# Patient Record
Sex: Male | Born: 1955 | Race: White | Hispanic: No | Marital: Married
Health system: Midwestern US, Community
[De-identification: ages and names within clinical notes are randomized; demographics above are authoritative.]

## PROBLEM LIST (undated history)

## (undated) DIAGNOSIS — R351 Nocturia: Secondary | ICD-10-CM

## (undated) DIAGNOSIS — R609 Edema, unspecified: Secondary | ICD-10-CM

## (undated) DIAGNOSIS — K219 Gastro-esophageal reflux disease without esophagitis: Secondary | ICD-10-CM

## (undated) DIAGNOSIS — M25511 Pain in right shoulder: Secondary | ICD-10-CM

## (undated) DIAGNOSIS — M85672 Other cyst of bone, left ankle and foot: Secondary | ICD-10-CM

## (undated) DIAGNOSIS — M159 Polyosteoarthritis, unspecified: Secondary | ICD-10-CM

## (undated) DIAGNOSIS — E66811 Obesity, class 1: Secondary | ICD-10-CM

## (undated) DIAGNOSIS — N400 Enlarged prostate without lower urinary tract symptoms: Secondary | ICD-10-CM

## (undated) DIAGNOSIS — F17200 Nicotine dependence, unspecified, uncomplicated: Secondary | ICD-10-CM

## (undated) DIAGNOSIS — F339 Major depressive disorder, recurrent, unspecified: Secondary | ICD-10-CM

## (undated) DIAGNOSIS — E669 Obesity, unspecified: Secondary | ICD-10-CM

## (undated) DIAGNOSIS — Z1212 Encounter for screening for malignant neoplasm of rectum: Secondary | ICD-10-CM

## (undated) DIAGNOSIS — M5136 Other intervertebral disc degeneration, lumbar region: Secondary | ICD-10-CM

## (undated) DIAGNOSIS — Z22322 Carrier or suspected carrier of Methicillin resistant Staphylococcus aureus: Secondary | ICD-10-CM

## (undated) DIAGNOSIS — M66371 Spontaneous rupture of flexor tendons, right ankle and foot: Secondary | ICD-10-CM

## (undated) DIAGNOSIS — G479 Sleep disorder, unspecified: Secondary | ICD-10-CM

## (undated) DIAGNOSIS — M170 Bilateral primary osteoarthritis of knee: Secondary | ICD-10-CM

## (undated) DIAGNOSIS — Z01818 Encounter for other preprocedural examination: Secondary | ICD-10-CM

## (undated) DIAGNOSIS — N529 Male erectile dysfunction, unspecified: Secondary | ICD-10-CM

## (undated) DIAGNOSIS — Z1211 Encounter for screening for malignant neoplasm of colon: Secondary | ICD-10-CM

## (undated) DIAGNOSIS — R52 Pain, unspecified: Secondary | ICD-10-CM

## (undated) DIAGNOSIS — T887XXA Unspecified adverse effect of drug or medicament, initial encounter: Secondary | ICD-10-CM

## (undated) DIAGNOSIS — Z981 Arthrodesis status: Secondary | ICD-10-CM

## (undated) DIAGNOSIS — M1712 Unilateral primary osteoarthritis, left knee: Secondary | ICD-10-CM

## (undated) DIAGNOSIS — M51369 Other intervertebral disc degeneration, lumbar region without mention of lumbar back pain or lower extremity pain: Secondary | ICD-10-CM

## (undated) DIAGNOSIS — M2550 Pain in unspecified joint: Secondary | ICD-10-CM

## (undated) DIAGNOSIS — M8560 Other cyst of bone, unspecified site: Secondary | ICD-10-CM

## (undated) DIAGNOSIS — M542 Cervicalgia: Secondary | ICD-10-CM

## (undated) DIAGNOSIS — C4401 Basal cell carcinoma of skin of lip: Secondary | ICD-10-CM

## (undated) DIAGNOSIS — G4733 Obstructive sleep apnea (adult) (pediatric): Secondary | ICD-10-CM

## (undated) DIAGNOSIS — M1990 Unspecified osteoarthritis, unspecified site: Secondary | ICD-10-CM

## (undated) DIAGNOSIS — K130 Diseases of lips: Secondary | ICD-10-CM

## (undated) DIAGNOSIS — M25512 Pain in left shoulder: Secondary | ICD-10-CM

## (undated) DIAGNOSIS — R600 Localized edema: Secondary | ICD-10-CM

## (undated) DIAGNOSIS — K4090 Unilateral inguinal hernia, without obstruction or gangrene, not specified as recurrent: Secondary | ICD-10-CM

## (undated) DIAGNOSIS — L02429 Furuncle of limb, unspecified: Secondary | ICD-10-CM

---

## 2010-02-26 LAB — CBC
Basophils: 0.3 % (ref 0.0–3.0)
Eosinophils: 5.8 % — ABNORMAL HIGH (ref 0.0–4.0)
Hematocrit: 41.9 % — ABNORMAL LOW (ref 42.0–52.0)
Hemoglobin: 14.6 gm/dl (ref 14.0–18.0)
Lymphocytes: 27.6 % (ref 15.0–47.0)
MCH: 33.2 pg — ABNORMAL HIGH (ref 27.0–31.0)
MCHC: 34.8 gm/dl (ref 33.0–37.0)
MCV: 95.4 fL — ABNORMAL HIGH (ref 80.0–94.0)
MPV: 8.4 mcm (ref 7.4–10.4)
Monocytes: 8.6 % (ref 0.0–12.0)
Nucleated Red Blood Cells: 0 /100 wbc
Platelets: 154 10*3/uL (ref 130–400)
RBC Morphology: NORMAL
RBC: 4.39 10*6/uL — ABNORMAL LOW (ref 4.70–6.10)
RDW: 13.8 % (ref 11.5–14.5)
Seg Neutrophils: 57.7 % (ref 43.0–75.0)
WBC: 6.2 10*3/uL (ref 4.8–10.8)

## 2010-02-26 LAB — LIPID PANEL
Cholesterol, Total: 173 mg/dl (ref 100–199)
HDL: 62 mg/dl
LDL Calculated: 95 mg/dl
Triglycerides: 79 mg/dl (ref 0–199)

## 2010-02-26 LAB — BASIC METABOLIC PANEL
BUN: 24 mg/dl — ABNORMAL HIGH (ref 7–22)
CO2: 22 meq/l — ABNORMAL LOW (ref 23–33)
Calcium: 9.2 mg/dl (ref 8.5–10.5)
Chloride: 110 meq/l (ref 98–111)
Creatinine: 1.1 mg/dl (ref 0.4–1.2)
Glucose: 94 mg/dl (ref 70–108)
Potassium: 4.1 meq/l (ref 3.5–5.2)
Sodium: 141 meq/l (ref 135–145)

## 2010-02-26 LAB — T4, FREE: T4 Free: 1.03 ng/dl (ref 0.89–1.76)

## 2010-02-26 LAB — GLOMERULAR FILTRATION RATE, ESTIMATED: Est, Glom Filt Rate: 69 mL/min/{1.73_m2} — AB

## 2010-02-26 LAB — TSH: TSH: 2.215 mcIU/ml (ref 0.400–4.200)

## 2010-02-27 LAB — TESTOSTERONE

## 2010-02-27 NOTE — Telephone Encounter (Signed)
Message copied by Raynald Blend on Tue Feb 27, 2010  4:43 PM  ------       Message from: August Luz       Created: Tue Feb 27, 2010  4:32 PM         Testosterone level is normal.  Notify pt.

## 2010-02-27 NOTE — Telephone Encounter (Signed)
NL test results were left with pt.

## 2010-02-28 NOTE — Telephone Encounter (Signed)
NL test results were left with pt.

## 2010-04-16 ENCOUNTER — Telehealth

## 2010-04-16 NOTE — Telephone Encounter (Signed)
T/C message from Onalee HuaDavid [phone] asking for Chantix RX to [facility], [address].  He will check w pharmacy he states.

## 2010-04-16 NOTE — Telephone Encounter (Signed)
ep'ed starter kit to pharm requested.  Will need continuing kits if needs refills.

## 2010-05-21 ENCOUNTER — Encounter

## 2010-05-21 NOTE — Telephone Encounter (Signed)
Ep;ed proscar to pharm requested

## 2010-05-21 NOTE — Telephone Encounter (Signed)
Patient would like Rx called in for proscar 5 mg.  Call patient back only if problem

## 2010-07-10 ENCOUNTER — Encounter

## 2010-07-10 MED ORDER — TADALAFIL 20 MG PO TABS
20 | ORAL_TABLET | ORAL | Status: DC | PRN
Start: 2010-07-10 — End: 2010-07-11

## 2010-07-10 NOTE — Progress Notes (Signed)
Subjective:      Patient ID: Bradley Oconnell is a 54 y.o. male.    HPI  Pt here for med refills of androgel, cialis and chantix.  Also has warts to remove.  These are anal warts and requests they be burnt off.  Review of Systems   Constitutional: Negative for fever, chills, fatigue and unexpected weight change.   HENT: Negative for ear pain, congestion, sore throat, rhinorrhea and neck pain.    Eyes: Negative for pain and visual disturbance.   Respiratory: Negative for cough, chest tightness, shortness of breath and wheezing.    Cardiovascular: Negative for chest pain and palpitations.   Gastrointestinal: Negative for nausea, vomiting, abdominal pain, diarrhea, constipation and blood in stool.   Genitourinary: Negative for urgency, frequency, hematuria and difficulty urinating.   Musculoskeletal: Negative for myalgias, back pain and joint swelling.   Skin: Negative for rash.   Neurological: Negative for dizziness and headaches.   Hematological: Negative for adenopathy. Does not bruise/bleed easily.   Psychiatric/Behavioral: Negative for behavioral problems and sleep disturbance. The patient is not nervous/anxious.        Objective:   Physical Exam   Nursing note and vitals reviewed.  Constitutional: He is oriented to person, place, and time. He appears well-developed and well-nourished.   HENT:   Head: Normocephalic and atraumatic.   Right Ear: External ear normal.   Left Ear: External ear normal.   Nose: Nose normal.   Mouth/Throat: Oropharynx is clear and moist.   Eyes: EOM are normal. Pupils are equal, round, and reactive to light.   Neck: Neck supple. Carotid bruit is not present. No thyromegaly present.   Cardiovascular: Normal rate, regular rhythm and normal heart sounds.    Pulmonary/Chest: Breath sounds normal. He has no wheezes. He has no rales.   Abdominal: Soft. Bowel sounds are normal. No tenderness. He has no rebound and no guarding.   Genitourinary:         Musculoskeletal: Normal range of motion. He  exhibits no edema.   Lymphadenopathy:     He has no cervical adenopathy.   Neurological: He is alert and oriented to person, place, and time. He has normal reflexes. No cranial nerve deficit.   Skin: Skin is warm and dry. No rash noted.   Psychiatric: He has a normal mood and affect.       Assessment:      Anal warts  Testosterone deficiency  ED  Nicotine dependence      Plan:      Refill meds per chart.  Antibiotic ointment on the wart areas til healed.

## 2010-07-11 ENCOUNTER — Encounter

## 2010-07-11 NOTE — Telephone Encounter (Signed)
Pt said that his rx's were not correct from yesterday's office visit.  The androgel needed to be 25mg  instead of 50mg  and the cialis needs to be 5mg  qd instead of 20mg .  Orders pending.

## 2010-07-11 NOTE — Telephone Encounter (Signed)
Phone both rxs.  Thanks.

## 2010-07-12 NOTE — Telephone Encounter (Signed)
Rx's called to [facility] "Rosanne Ashing".

## 2010-09-17 LAB — T4, FREE

## 2010-09-17 LAB — BASIC METABOLIC PANEL

## 2010-09-17 LAB — TSH

## 2010-09-17 LAB — SEDIMENTATION RATE, AUTOMATED

## 2010-09-17 NOTE — Progress Notes (Signed)
Subjective:      Patient ID: Bradley Oconnell is a 55 y.o. male.    HPI  Pt here with multiple complaints.  Family history of parkinsons.  Hands have been shaking in the AMs.  Feels it internally.  Only seems to be in the AMs.  Has tried to decrease caffeine.  Has also tried to go off the topamax to see if that helps.  Over the last 5 weeks, lethargy, chills and not feeling well.  Has been cold more than usual.  Dry skin.  Mobic is not controlling the arthritis.  Mostly in the ankles.  Takes seroquel 1/4 tab at night to aid with sleeping. (12.5 mg).    Review of Systems   Constitutional: Positive for fatigue. Negative for fever, chills and unexpected weight change.   HENT: Negative for ear pain, congestion, sore throat, rhinorrhea and neck pain.    Eyes: Negative for pain and visual disturbance.   Respiratory: Negative for cough, chest tightness, shortness of breath and wheezing.    Cardiovascular: Negative for chest pain and palpitations.   Gastrointestinal: Negative for nausea, vomiting, abdominal pain, diarrhea, constipation and blood in stool.   Genitourinary: Negative for urgency, frequency, hematuria and difficulty urinating.   Musculoskeletal: Negative for myalgias, back pain and joint swelling.   Skin: Negative for rash.   Neurological: Positive for tremors. Negative for dizziness and headaches.   Hematological: Negative for adenopathy. Does not bruise/bleed easily.   Psychiatric/Behavioral: Positive for sleep disturbance. Negative for behavioral problems. The patient is not nervous/anxious.        Objective:   Physical Exam   Nursing note and vitals reviewed.  Constitutional: He is oriented to person, place, and time. He appears well-developed and well-nourished.   HENT:   Head: Normocephalic and atraumatic.   Right Ear: External ear normal.   Left Ear: External ear normal.   Nose: Nose normal.   Mouth/Throat: Oropharynx is clear and moist.   Eyes: EOM are normal. Pupils are equal, round, and reactive to  light.   Neck: Neck supple. No thyromegaly present.   Cardiovascular: Normal rate, regular rhythm and normal heart sounds.    Pulmonary/Chest: Breath sounds normal. He has no wheezes. He has no rales.   Abdominal: Soft. Bowel sounds are normal. No tenderness. He has no rebound and no guarding.   Genitourinary:         Musculoskeletal: Normal range of motion. He exhibits no edema.   Lymphadenopathy:     He has no cervical adenopathy.   Neurological: He is alert and oriented to person, place, and time. He has normal reflexes. He displays tremor (very mild). No cranial nerve deficit.   Skin: Skin is warm and dry. No rash noted.   Psychiatric: He has a normal mood and affect.       Assessment:      Lethargy  Tremor  OA  Hand tremor  Genital warts      Plan:      Blood work, cbc, bmp, tsh, free t4 , hiv, esr, testosterone level  Refilled toradol as needed for oa breakthrough pain  Samples of cialis 5 mg daily.

## 2010-09-18 LAB — CBC

## 2010-09-18 NOTE — Telephone Encounter (Signed)
Spoke with patient, all labs within normal limits. Cb/lpn

## 2010-11-06 NOTE — Progress Notes (Signed)
Subjective:      Patient ID: Bradley Oconnell is a 55 y.o. male.    HPI  Pt here due to weight loss issues.  Interested in adipex.  Already on topamax.  Stopped the parnate so can do it.  BMI qualifies.  Review of Systems   Constitutional: Negative for fever, chills, fatigue and unexpected weight change.   HENT: Negative for ear pain, congestion, sore throat, rhinorrhea and neck pain.    Eyes: Negative for pain and visual disturbance.   Respiratory: Negative for cough, chest tightness, shortness of breath and wheezing.    Cardiovascular: Negative for chest pain and palpitations.   Gastrointestinal: Negative for nausea, vomiting, abdominal pain, diarrhea, constipation and blood in stool.   Genitourinary: Negative for urgency, frequency, hematuria and difficulty urinating.   Musculoskeletal: Negative for myalgias, back pain and joint swelling.   Skin: Negative for rash.   Neurological: Negative for dizziness and headaches.   Hematological: Negative for adenopathy. Does not bruise/bleed easily.   Psychiatric/Behavioral: Negative for behavioral problems and sleep disturbance. The patient is not nervous/anxious.        Objective:   Physical Exam   [nursing notereviewed.  Constitutional: He is oriented to person, place, and time. He appears well-developed and well-nourished.   HENT:   Head: Normocephalic and atraumatic.   Right Ear: External ear normal.   Left Ear: External ear normal.   Nose: Nose normal.   Mouth/Throat: Oropharynx is clear and moist.   Eyes: EOM are normal. Pupils are equal, round, and reactive to light.   Neck: Neck supple. No thyromegaly present.   Cardiovascular: Normal rate, regular rhythm and normal heart sounds.    Pulmonary/Chest: Breath sounds normal. He has no wheezes. He has no rales.   Abdominal: Soft. Bowel sounds are normal. No tenderness. He has no rebound and no guarding.   Musculoskeletal: Normal range of motion. He exhibits no edema.   Lymphadenopathy:     He has no cervical adenopathy.    Neurological: He is alert and oriented to person, place, and time. He has normal reflexes. No cranial nerve deficit.   Skin: Skin is warm and dry. No rash noted.   Psychiatric: He has a normal mood and affect.       Assessment:      Obesity  Testosterone deficiency      Plan:    #1 Adipex p37.5 mg daily

## 2010-12-03 NOTE — Progress Notes (Signed)
Subjective:      Patient ID: Bradley Oconnell is a 55 y.o. male.    HPI  Pt here for follow up of adipex.  Lost 6 ponds this month.  Feels the depression coming back off the parnate.  So wants to try for 1 more month then resume the parnate.  Is exercising.  Review of Systems   Constitutional: Negative for fever, chills, fatigue and unexpected weight change.   HENT: Negative for ear pain, congestion, sore throat, rhinorrhea and neck pain.    Eyes: Negative for pain and visual disturbance.   Respiratory: Negative for cough, chest tightness, shortness of breath and wheezing.    Cardiovascular: Negative for chest pain and palpitations.   Gastrointestinal: Negative for nausea, vomiting, abdominal pain, diarrhea, constipation and blood in stool.   Genitourinary: Negative for urgency, frequency, hematuria and difficulty urinating.   Musculoskeletal: Negative for myalgias, back pain and joint swelling.   Skin: Negative for rash.   Neurological: Negative for dizziness and headaches.   Hematological: Negative for adenopathy. Does not bruise/bleed easily.   Psychiatric/Behavioral: Negative for behavioral problems and sleep disturbance. The patient is not nervous/anxious.        Objective:   Physical Exam   Nursing note and vitals reviewed.  Constitutional: He is oriented to person, place, and time. He appears well-developed and well-nourished.   HENT:   Head: Normocephalic and atraumatic.   Right Ear: External ear normal.   Left Ear: External ear normal.   Nose: Nose normal.   Mouth/Throat: Oropharynx is clear and moist.   Eyes: EOM are normal. Pupils are equal, round, and reactive to light.   Neck: Neck supple. No thyromegaly present.   Cardiovascular: Normal rate, regular rhythm and normal heart sounds.    Pulmonary/Chest: Breath sounds normal. He has no wheezes. He has no rales.   Abdominal: Soft. Bowel sounds are normal. There is no tenderness. There is no rebound and no guarding.   Musculoskeletal: Normal range of motion.  He exhibits no edema.   Lymphadenopathy:     He has no cervical adenopathy.   Neurological: He is alert and oriented to person, place, and time. He has normal reflexes. No cranial nerve deficit.   Skin: Skin is warm and dry. No rash noted.   Psychiatric: He has a normal mood and affect.       Assessment:      Obesity  Depression      Plan:      #2 Adipex P 37. 5 mg daily

## 2010-12-06 NOTE — Telephone Encounter (Signed)
Spoke to pt and notified him not to use the Toradol or Mobic with the stomach pain as these could irritate the stomach more. Pt notified to use Bentyl and that it was EP'd to [facility]. Pt will try this medication and call if it doesn't help.

## 2010-12-06 NOTE — Telephone Encounter (Signed)
Would not use toradol or mobic with stomach pain as these could irritate the stomach more.  Recommend bentyl to help the stomach.  ep'ed to pharmacy.

## 2010-12-06 NOTE — Progress Notes (Signed)
Subjective:      Patient ID: Bradley Oconnell is a 55 y.o. male.    HPI  Pt here due to 48 hours of vomiting and diarrhea.  Doesn't feel feverish.  No bad foods.  Had similar but less severe episode in October.  Centered in the epigastrium.  Antacids did not help.  Review of Systems   Constitutional: Positive for chills. Negative for fever, fatigue and unexpected weight change.   HENT: Negative for ear pain, congestion, sore throat, rhinorrhea and neck pain.    Eyes: Negative for pain and visual disturbance.   Respiratory: Negative for cough, chest tightness, shortness of breath and wheezing.    Cardiovascular: Negative for chest pain and palpitations.   Gastrointestinal: Positive for nausea, vomiting, abdominal pain and diarrhea. Negative for constipation and blood in stool.   Genitourinary: Negative for urgency, frequency, hematuria and difficulty urinating.   Musculoskeletal: Negative for myalgias, back pain and joint swelling.   Skin: Negative for rash.   Neurological: Negative for dizziness and headaches.   Hematological: Negative for adenopathy. Does not bruise/bleed easily.   Psychiatric/Behavioral: Negative for behavioral problems and sleep disturbance. The patient is not nervous/anxious.        Objective:   Physical Exam   Nursing note and vitals reviewed.  Constitutional: He is oriented to person, place, and time. He appears well-developed and well-nourished.   HENT:   Head: Normocephalic and atraumatic.   Right Ear: External ear normal.   Left Ear: External ear normal.   Nose: Nose normal.   Mouth/Throat: Oropharynx is clear and moist.   Eyes: EOM are normal. Pupils are equal, round, and reactive to light.   Neck: Neck supple. No thyromegaly present.   Cardiovascular: Normal rate, regular rhythm and normal heart sounds.    Pulmonary/Chest: Breath sounds normal. He has no wheezes. He has no rales.   Abdominal: Soft. Bowel sounds are decreased. There is tenderness in the epigastric area. There is no rebound  and no guarding.       Musculoskeletal: Normal range of motion. He exhibits no edema.   Lymphadenopathy:     He has no cervical adenopathy.   Neurological: He is alert and oriented to person, place, and time. He has normal reflexes. No cranial nerve deficit.   Skin: Skin is warm and dry. No rash noted.   Psychiatric: He has a normal mood and affect.       Assessment:      Abdominal pain  Vomiting.      Plan:      Phenergan 25 mg every 6 hours as needed.  Work slip  Consider ugi, gb ultrasound if not improving

## 2010-12-06 NOTE — Telephone Encounter (Signed)
Pt Avery DennisonDavid Kluger req pain med.  He took Phenergan after got home from this am's appt and only vomited 1 more X.  He passed a liquid stool.  The abd pain is not worse, but it's not better so he req pain meds.  He has on hand  1.) Toradol from prev Rx (#10 left) but hasn't taken any.  2.) Mobic but has been off it X last 1 wk d/t procedure.  Pt uses [facility] [address]    Pls call pt back to respond at [phone].

## 2011-04-01 MED ORDER — FINASTERIDE 5 MG PO TABS
5 MG | ORAL_TABLET | Freq: Every day | ORAL | Status: DC
Start: 2011-04-01 — End: 2012-05-11

## 2011-04-01 MED ORDER — ZOSTER VACCINE LIVE 19400 UNT/0.65ML SC SOLR
19400 UNT/0.65ML | Freq: Once | SUBCUTANEOUS | Status: AC
Start: 2011-04-01 — End: 2011-04-01

## 2011-04-01 NOTE — Progress Notes (Addendum)
Subjective:      Patient ID: Bradley Oconnell is a 55 y.o. male.    HPI  Pt here for annual exam and med refills.  Reviewed BMI of 31.  Encouraged diet, exercise and weight loss.  Gets peripheral edema, does have arthritis in the legs.  Denies any SOB or chest pain.  Works out without getting any sxs.  Pt better with the ditropan for nocturia.  Wondering about raising the dose somewhat.  Divorced, former smoker, pmh reviewed.    Review of Systems   Constitutional: Negative for fever, chills, fatigue and unexpected weight change.   HENT: Negative for ear pain, congestion, sore throat, rhinorrhea and neck pain.    Eyes: Negative for pain and visual disturbance.   Respiratory: Negative for cough, chest tightness, shortness of breath and wheezing.    Cardiovascular: Negative for chest pain and palpitations.   Gastrointestinal: Negative for nausea, vomiting, abdominal pain, diarrhea, constipation and blood in stool.   Genitourinary: Negative for urgency, frequency, hematuria and difficulty urinating.   Musculoskeletal: Negative for myalgias, back pain and joint swelling.   Skin: Negative for rash.   Neurological: Negative for dizziness and headaches.   Hematological: Negative for adenopathy. Does not bruise/bleed easily.   Psychiatric/Behavioral: Negative for behavioral problems and sleep disturbance. The patient is not nervous/anxious.        Objective:   Physical Exam   Nursing note and vitals reviewed.  Constitutional: He is oriented to person, place, and time. He appears well-developed and well-nourished.   HENT:   Head: Normocephalic and atraumatic.   Right Ear: External ear normal.   Left Ear: External ear normal.   Nose: Nose normal.   Mouth/Throat: Oropharynx is clear and moist.   Eyes: EOM are normal. Pupils are equal, round, and reactive to light.   Neck: Neck supple. Carotid bruit is not present. No thyromegaly present.   Cardiovascular: Normal rate, regular rhythm and normal heart sounds.    Pulmonary/Chest:  Breath sounds normal. He has no wheezes. He has no rales.   Abdominal: Soft. Bowel sounds are normal. There is no tenderness. There is no rebound and no guarding.   Genitourinary:         Musculoskeletal: Normal range of motion. He exhibits no edema.   Lymphadenopathy:     He has no cervical adenopathy.   Neurological: He is alert and oriented to person, place, and time. He has normal reflexes. No cranial nerve deficit.   Skin: Skin is warm and dry. No rash noted.   Psychiatric: He has a normal mood and affect.       Assessment:      Testosterone deficiency  ED  OA  Genital wart.      Plan:      Refill meds.   Antibiotic ointment til healed  Cont exercise/ weight loss.

## 2012-05-05 ENCOUNTER — Encounter

## 2012-05-05 MED ORDER — OXYBUTYNIN CHLORIDE 5 MG PO TABS
5 MG | ORAL_TABLET | Freq: Two times a day (BID) | ORAL | Status: DC
Start: 2012-05-05 — End: 2012-05-11

## 2012-05-05 NOTE — Telephone Encounter (Signed)
Patient's partner called.  States that he has an appt on Monday for med refills but only has 3 oxybutinin left.  Can we call in a short term supply for him?  Call him back only if any problems

## 2012-05-05 NOTE — Telephone Encounter (Signed)
ep'ed oxybutinin to pharm requested

## 2012-05-11 MED ORDER — MELOXICAM 15 MG PO TABS
15 MG | ORAL_TABLET | Freq: Every day | ORAL | Status: DC
Start: 2012-05-11 — End: 2012-10-27

## 2012-05-11 MED ORDER — FINASTERIDE 5 MG PO TABS
5 MG | ORAL_TABLET | Freq: Every day | ORAL | Status: DC
Start: 2012-05-11 — End: 2012-10-27

## 2012-05-11 MED ORDER — TRANYLCYPROMINE SULFATE 10 MG PO TABS
10 MG | ORAL_TABLET | Freq: Three times a day (TID) | ORAL | Status: DC
Start: 2012-05-11 — End: 2012-10-27

## 2012-05-11 MED ORDER — OXYBUTYNIN CHLORIDE 5 MG PO TABS
5 MG | ORAL_TABLET | Freq: Two times a day (BID) | ORAL | Status: DC
Start: 2012-05-11 — End: 2014-07-12

## 2012-05-11 MED ORDER — KETOROLAC TROMETHAMINE 10 MG PO TABS
10 MG | ORAL_TABLET | Freq: Four times a day (QID) | ORAL | Status: DC | PRN
Start: 2012-05-11 — End: 2013-03-03

## 2012-05-11 MED ORDER — TOPIRAMATE 100 MG PO TABS
100 MG | ORAL_TABLET | Freq: Three times a day (TID) | ORAL | Status: DC
Start: 2012-05-11 — End: 2012-10-27

## 2012-05-11 NOTE — Progress Notes (Signed)
Have you seen any other physician or provider since your last visit no    Have you had any other diagnostic tests since your last visit? no    Have you changed or stopped any medications since your last visit including any over-the-counter medicines, vitamins, or herbal medicines? no     Are you taking all your prescribed medications? Yes    If NO, why?

## 2012-05-11 NOTE — Progress Notes (Signed)
Subjective:      Patient ID: Bradley Oconnell is a 56 y.o. male.    HPI  Pt here for annual exam and med refills.  Needs fasting blood work.  Reviewed BMI of 31.  Encouraged diet, exercise and weight loss..  Reviewed health maintenance.  Peripheral edema has been happening but no SOB, chest pain.  Divorced, non smoker, pmh reviewed.  Review of Systems   Constitutional: Negative for fever, chills, fatigue and unexpected weight change.   HENT: Negative for ear pain, congestion, sore throat, rhinorrhea and neck pain.    Eyes: Negative for pain and visual disturbance.   Respiratory: Negative for cough, chest tightness, shortness of breath and wheezing.    Cardiovascular: Positive for leg swelling. Negative for chest pain and palpitations.   Gastrointestinal: Negative for nausea, vomiting, abdominal pain, diarrhea, constipation and blood in stool.   Genitourinary: Negative for urgency, frequency, hematuria and difficulty urinating.   Musculoskeletal: Negative for myalgias, back pain and joint swelling.   Skin: Negative for rash.   Neurological: Negative for dizziness and headaches.   Hematological: Negative for adenopathy. Does not bruise/bleed easily.   Psychiatric/Behavioral: Negative for behavioral problems and sleep disturbance. The patient is not nervous/anxious.        Objective:   Physical Exam   Nursing note and vitals reviewed.  Constitutional: He is oriented to person, place, and time. He appears well-developed and well-nourished.   HENT:   Head: Normocephalic and atraumatic.   Right Ear: External ear normal.   Left Ear: External ear normal.   Nose: Nose normal.   Mouth/Throat: Oropharynx is clear and moist.   Eyes: EOM are normal. Pupils are equal, round, and reactive to light.   Neck: Neck supple. Carotid bruit is not present. No thyromegaly present.   Cardiovascular: Normal rate, regular rhythm and normal heart sounds.    Pulmonary/Chest: Breath sounds normal. He has no wheezes. He has no rales.   Abdominal:  Soft. Bowel sounds are normal. There is no tenderness. There is no rebound and no guarding.   Musculoskeletal: Normal range of motion. He exhibits no edema.        Right lower leg: He exhibits edema.        Left lower leg: He exhibits edema.   1-2 + edema     Lymphadenopathy:     He has no cervical adenopathy.   Neurological: He is alert and oriented to person, place, and time. He has normal reflexes. No cranial nerve deficit.   Skin: Skin is warm and dry. No rash noted.   Psychiatric: He has a normal mood and affect.     Health Maintenance   Topic Date Due   ??? Colon Cancer Screening Colonoscopy  07/05/2004   ??? Flu Vaccine Yearly (Adult)  03/22/2013   ??? Psa Counseling  05/11/2013   ??? Tetanus Vaccine Adult (11 Years And Up)  05/11/2022        Assessment:      Overactive bladder  OA    Chronic depression  Obesity      Plan:      Refill meds  Fasting blood work orders  Encouraged diet, exercise and weight loss.  Reviewed health maintenance    Onalee Hua received counseling on the following healthy behaviors: nutrition, exercise and medication adherence    Patient given educational materials on Nutrition and Exercise    I have instructed Axyl to complete a self tracking handout on Weights and instructed them to bring it  with them to his next appointment.     Discussed use, benefit, and side effects of prescribed medications.  Barriers to medication compliance addressed.  All patient questions answered.  Pt voiced understanding.

## 2012-05-22 LAB — GLOMERULAR FILTRATION RATE, ESTIMATED: Est, Glom Filt Rate: 62 mL/min/{1.73_m2} — AB

## 2012-05-22 LAB — BASIC METABOLIC PANEL
BUN: 25 mg/dl — ABNORMAL HIGH (ref 7–22)
CO2: 25 meq/l (ref 23–33)
Calcium: 8.7 mg/dl (ref 8.5–10.5)
Chloride: 108 meq/l (ref 98–111)
Creatinine: 1.2 mg/dl (ref 0.4–1.2)
Glucose: 101 mg/dl (ref 70–108)
Potassium: 4.2 meq/l (ref 3.5–5.2)
Sodium: 137 meq/l (ref 135–145)

## 2012-05-22 LAB — LIPID PANEL
Cholesterol, Total: 174 mg/dl (ref 100–199)
HDL: 75 mg/dl
LDL Calculated: 84 mg/dl
Triglycerides: 76 mg/dl (ref 0–199)

## 2012-05-22 LAB — HEPATIC FUNCTION PANEL
ALT: 26 U/L (ref 11–66)
AST: 32 U/L (ref 12–45)
Albumin: 4.5 gm/dl (ref 3.5–5.1)
Alkaline Phosphatase: 60 U/L (ref 38–126)
Bilirubin, Direct: 0.3 mg/dl (ref 0.0–0.3)
Total Bilirubin: 0.8 mg/dl (ref 0.3–1.2)
Total Protein: 7.3 gm/dl (ref 6.1–8.0)

## 2012-05-22 LAB — TSH: TSH: 2.684 mcIU/ml (ref 0.400–4.200)

## 2012-05-22 LAB — CBC
Hematocrit: 41.9 % — ABNORMAL LOW (ref 42.0–52.0)
Hemoglobin: 14.2 gm/dl (ref 14.0–18.0)
MCH: 31.7 pg — ABNORMAL HIGH (ref 27.0–31.0)
MCHC: 34 gm/dl (ref 33.0–37.0)
MCV: 93.5 fL (ref 80.0–94.0)
MPV: 8.1 mcm (ref 7.4–10.4)
Platelets: 184 10*3/uL (ref 130–400)
RBC: 4.48 10*6/uL — ABNORMAL LOW (ref 4.70–6.10)
RDW: 12.9 % (ref 11.5–14.5)
WBC: 5.8 10*3/uL (ref 4.8–10.8)

## 2012-05-22 LAB — PSA PROSTATIC SPECIFIC ANTIGEN: PSA: 0.31 ng/ml (ref 0.00–2.50)

## 2012-05-22 LAB — T4, FREE: T4 Free: 1.19 ng/dl (ref 0.89–1.76)

## 2012-05-22 NOTE — Telephone Encounter (Signed)
Left message for pt to call back.

## 2012-05-22 NOTE — Telephone Encounter (Signed)
Message copied by Nani RavensEMAN, DIANE on Fri May 22, 2012  8:32 AM  ------       Message from: August LuzINGWALD, RONALD A       Created: Fri May 22, 2012  8:14 AM         Notify bmp, cbc, thyroid, liver, and psa are normal.  Cholesterol good at 174 with low CRR.  Continue present.  ------

## 2012-05-26 NOTE — Telephone Encounter (Signed)
Pt notified that bmp, cbc, thyroid, liver, and psa are normal.  Cholesterol at 174.  Continue present.

## 2012-10-27 MED ORDER — FINASTERIDE 5 MG PO TABS
5 MG | ORAL_TABLET | Freq: Every day | ORAL | Status: DC
Start: 2012-10-27 — End: 2012-12-21

## 2012-10-27 MED ORDER — OXYBUTYNIN CHLORIDE 5 MG PO TABS
5 MG | ORAL_TABLET | Freq: Two times a day (BID) | ORAL | Status: DC
Start: 2012-10-27 — End: 2012-11-16

## 2012-10-27 MED ORDER — MELOXICAM 15 MG PO TABS
15 MG | ORAL_TABLET | Freq: Every day | ORAL | Status: DC
Start: 2012-10-27 — End: 2012-12-21

## 2012-10-27 MED ORDER — MODAFINIL 200 MG PO TABS
200 MG | ORAL_TABLET | Freq: Every day | ORAL | Status: DC
Start: 2012-10-27 — End: 2012-11-11

## 2012-10-27 MED ORDER — TOPIRAMATE 100 MG PO TABS
100 MG | ORAL_TABLET | Freq: Three times a day (TID) | ORAL | Status: DC
Start: 2012-10-27 — End: 2012-11-16

## 2012-10-27 MED ORDER — TRANYLCYPROMINE SULFATE 10 MG PO TABS
10 MG | ORAL_TABLET | Freq: Three times a day (TID) | ORAL | Status: DC
Start: 2012-10-27 — End: 2012-11-16

## 2012-10-27 MED ORDER — TESTOSTERONE 50 MG/5GM (1%) TD GEL
50 MG/5GM (1%) | PACK | Freq: Every day | TRANSDERMAL | Status: DC
Start: 2012-10-27 — End: 2013-02-15

## 2012-10-27 NOTE — Progress Notes (Signed)
Have you seen any other physician or provider since your last visit no    Have you had any other diagnostic tests since your last visit? yes -labs    Have you changed or stopped any medications since your last visit including any over-the-counter medicines, vitamins, or herbal medicines? no     Are you taking all your prescribed medications? Yes    If NO, why?

## 2012-10-27 NOTE — Progress Notes (Addendum)
Subjective:      Patient ID: Bradley Oconnell is a 57 y.o. male.    HPI  Pt here for pre-op physical at the request of DR. Haycock.  Scheduled to have right foot surgery on 11/11/2012 for hammer toes.  He has failed conservative therapy.  The hammer toes have progressed over the last several months.  These are causing pain and mal-fitting of his shoes.  Rated 5 out 10.  Tight shoes and excessive walking make the condition worse.  Reviewed BMI of 31.  Encouraged diet, exercise and weight loss.  Also having neck and right shoulder pain.  Has had MRI of neck previously showing arthritis and spurring.  Shoveling this winter, his right shoulder started to bother him.  Would like to restart androgel and provigil for wakefulness.  Single, former smoker, pmh reviewed.  Pt has a functional capacity.  No caths or stress tests in the last 5 years.  Denies any sob or chest pain.    Review of Systems   Constitutional: Negative for fever, chills, fatigue and unexpected weight change.   HENT: Positive for neck pain. Negative for ear pain, congestion, sore throat and rhinorrhea.    Eyes: Negative for pain and visual disturbance.   Respiratory: Negative for cough, chest tightness, shortness of breath and wheezing.    Cardiovascular: Negative for chest pain and palpitations.   Gastrointestinal: Negative for nausea, vomiting, abdominal pain, diarrhea, constipation and blood in stool.   Genitourinary: Negative for urgency, frequency, hematuria and difficulty urinating.   Musculoskeletal: Negative for myalgias, back pain and joint swelling.        Right foot pain  Right shoulder pain   Skin: Negative for rash.   Neurological: Negative for dizziness and headaches.   Hematological: Negative for adenopathy. Does not bruise/bleed easily.   Psychiatric/Behavioral: Negative for behavioral problems and sleep disturbance. The patient is not nervous/anxious.      Past Medical History   Diagnosis Date   ??? Depression, recurrent    ??? ED (erectile  dysfunction)    ??? OA (osteoarthritis)    ??? Testosterone deficiency    ??? Genital warts      Past Surgical History   Procedure Laterality Date   ??? Foot surgery Bilateral      x 4   ??? Hernia repair  2010   ??? Hemorrhoid surgery  2006     No Known Allergies  Current outpatient prescriptions:oxybutynin (DITROPAN) 5 MG tablet, Take 1 tablet by mouth 2 times daily., Disp: 60 tablet, Rfl: 11;  ketorolac (TORADOL) 10 MG tablet, Take 1 tablet by mouth every 6 hours as needed., Disp: 30 tablet, Rfl: 1;  tranylcypromine (PARNATE) 10 MG tablet, Take 1 tablet by mouth 3 times daily., Disp: 90 tablet, Rfl: 11;  topiramate (TOPAMAX) 100 MG tablet, Take 1 tablet by mouth 3 times daily., Disp: 90 tablet, Rfl: 11  meloxicam (MOBIC) 15 MG tablet, Take 1 tablet by mouth daily., Disp: 30 tablet, Rfl: 11;  finasteride (PROSCAR) 5 MG tablet, Take 1 tablet by mouth daily., Disp: 30 tablet, Rfl: 11;  tadalafil (CIALIS) 5 MG tablet, Take 1 tablet daily, Disp: 30 tablet, Rfl: 5  History     Social History   ??? Marital Status: Divorced     Spouse Name: N/A     Number of Children: N/A   ??? Years of Education: N/A     Occupational History   ??? Not on file.     Social History Main Topics   ???  Smoking status: Former Smoker   ??? Smokeless tobacco: Never Used   ??? Alcohol Use: Yes      Comment: socail   ??? Drug Use: Not on file   ??? Sexually Active: Not on file     Other Topics Concern   ??? Not on file     Social History Narrative   ??? No narrative on file     Family History   Problem Relation Age of Onset   ??? Parkinsonism Mother    ??? Heart Disease Father      Congestive Heart       Objective:   Physical Exam   Nursing note and vitals reviewed.  Constitutional: He is oriented to person, place, and time. He appears well-developed and well-nourished.   HENT:   Head: Normocephalic and atraumatic.   Right Ear: External ear normal.   Left Ear: External ear normal.   Nose: Nose normal.   Mouth/Throat: Oropharynx is clear and moist.   Eyes: EOM are normal. Pupils are  equal, round, and reactive to light.   Neck: Neck supple. Carotid bruit is not present. No thyromegaly present.   Cardiovascular: Normal rate, regular rhythm and normal heart sounds.    Pulmonary/Chest: Breath sounds normal. He has no wheezes. He has no rales.   Abdominal: Soft. Bowel sounds are normal. There is no tenderness. There is no rebound and no guarding.   Musculoskeletal: He exhibits no edema.        Right shoulder: He exhibits decreased range of motion.   Weakness of right rotator cuff     Lymphadenopathy:     He has no cervical adenopathy.   Neurological: He is alert and oriented to person, place, and time. He has normal reflexes. No cranial nerve deficit.   Skin: Skin is warm and dry. No rash noted.   Psychiatric: He has a normal mood and affect.     Filed Vitals:    10/27/12 1617   BP: 106/70   Pulse: 68   Temp: 98.2 ??F (36.8 ??C)   TempSrc: Oral   Resp: 16   Height: 5\' 6"  (1.676 m)   Weight: 194 lb (87.998 kg)     Assessment:      Pre-op physical  Right shoulder pain  Testosterone deficiency  Right foot hammer toes  Cervical DDD  Lethargy         Plan:      Pt is cleared for proposed surgery  Note to DR. Haycock.  MRI of cervical spine and right shoulder without contrast.  Androgel for testosterone deficiency   Trial of provigil for wakefulness  Stop toradol, mobic, and parnate prior to surgery

## 2012-10-30 NOTE — Telephone Encounter (Signed)
Spoke with Pt regarding Prior-Auth for MRI, additional information needed to complete prior-auth form.

## 2012-11-02 NOTE — Telephone Encounter (Signed)
MRI cervical spine approved 10/29/12-11/28/12 ref #[id number]    Left message for patient to call back to notify him MRI was approved.

## 2012-11-03 NOTE — Telephone Encounter (Signed)
Left VM for Pt to call the office.    Pt Prior auth for provigil was approved.

## 2012-11-03 NOTE — Telephone Encounter (Signed)
Pt called, informed MRI was approved.    Pt also states he his having trouble with his Provigil at the pharmacy. Please call [facility] on [address].    Spoke with [facility], Provigil needs a prior-auth, they will fax information.

## 2012-11-04 NOTE — Patient Instructions (Signed)
Pt. Instructed nothing to eat or drink after midnight the day before.  Bring a driver.  Take heart and blood pressure in the morning with a sip of water.  Bring photo ID and insurance cards.  Leave jewelry and valuables at home. Wear comfortable clothes. Wash the night before and morning of surgery with an antibacterial soap.

## 2012-11-05 ENCOUNTER — Telehealth

## 2012-11-05 NOTE — Telephone Encounter (Signed)
Message copied by Dorothea Glassman on Thu Nov 05, 2012  1:49 PM  ------       Message from: August Luz A       Created: Thu Nov 05, 2012 12:10 PM         Neck mri is unchanged from previous and shows varying degrees of stenosis.  ------

## 2012-11-05 NOTE — Telephone Encounter (Signed)
Pt informed of results, pt voiced understanding and had no further questions.  Pt would like referral to Dr Allena Katz.

## 2012-11-05 NOTE — Telephone Encounter (Signed)
Referral faxed to Dr Allena Katz at [place],  [phone]. Pt will call to schedule.

## 2012-11-05 NOTE — Telephone Encounter (Signed)
Message copied by Dorothea Glassman on Thu Nov 05, 2012  1:49 PM  ------       Message from: August Luz A       Created: Thu Nov 05, 2012 12:08 PM         MRI of the shoulder reveals multiple tears.  Recommend ortho.  ------

## 2012-11-06 ENCOUNTER — Telehealth

## 2012-11-06 NOTE — Telephone Encounter (Signed)
Ok for epidurals x 3

## 2012-11-06 NOTE — Telephone Encounter (Signed)
Pt called and would like to have an epidural  for his shoulder/neck pain @ [facility].  He would like to go this route instead of Ortho. He has done it this way in the past and would like to avoid surgery. If this does not help he will see Dr Allena Katz.

## 2012-11-09 NOTE — Telephone Encounter (Signed)
Spoke to patient.  Notified him ok for epidurals x 3.  Orders fax[facility] to [facility].  He will call [facility] to schedule.

## 2012-11-11 MED ORDER — FENTANYL CITRATE 0.05 MG/ML IJ SOLN
0.05 | INTRAMUSCULAR | Status: AC | PRN
Start: 2012-11-11 — End: 2012-11-11
  Administered 2012-11-11 (×4): 25 ug via INTRAVENOUS

## 2012-11-11 MED ORDER — PROMETHAZINE HCL 25 MG/ML IJ SOLN
25 | Freq: Once | INTRAMUSCULAR | Status: DC | PRN
Start: 2012-11-11 — End: 2012-11-11

## 2012-11-11 MED ORDER — DIPHENHYDRAMINE HCL 50 MG/ML IJ SOLN
50 | Freq: Once | INTRAMUSCULAR | Status: DC | PRN
Start: 2012-11-11 — End: 2012-11-11

## 2012-11-11 MED ADMIN — fentaNYL (SUBLIMAZE) injection 50 mcg: 50 ug | INTRAVENOUS | @ 14:00:00 | NDC 00409909332

## 2012-11-11 MED ADMIN — oxyCODONE-acetaminophen (PERCOCET) 5-325 MG per tablet 2 tablet: 2 | ORAL | @ 14:00:00 | NDC 00054865024

## 2012-11-11 MED ADMIN — ceFAZolin 1 gram (ANCEF) IVPB: 1 g | INTRAVENOUS | @ 12:00:00 | NDC 00338350341

## 2012-11-11 MED FILL — ONDANSETRON HCL 4 MG/2ML IJ SOLN: 4 MG/2ML | INTRAMUSCULAR | Qty: 2

## 2012-11-11 MED FILL — MIDAZOLAM HCL 2 MG/2ML IJ SOLN: 2 MG/ML | INTRAMUSCULAR | Qty: 4

## 2012-11-11 MED FILL — OXYCODONE-ACETAMINOPHEN 5-325 MG PO TABS: 5-325 MG | ORAL | Qty: 2

## 2012-11-11 MED FILL — DIPRIVAN 10 MG/ML IV EMUL: 10 MG/ML | INTRAVENOUS | Qty: 20

## 2012-11-11 MED FILL — DEXAMETHASONE SODIUM PHOSPHATE 4 MG/ML IJ SOLN: 4 MG/ML | INTRAMUSCULAR | Qty: 5

## 2012-11-11 MED FILL — FENTANYL CITRATE 0.05 MG/ML IJ SOLN: 0.05 MG/ML | INTRAMUSCULAR | Qty: 2

## 2012-11-11 MED FILL — CEFAZOLIN SODIUM 1-5 GM-% IV SOLN: 1-5 GM-% | INTRAVENOUS | Qty: 50

## 2012-11-11 MED FILL — SENSORCAINE-MPF 0.5 % IJ SOLN: 0.5 % | INTRAMUSCULAR | Qty: 30

## 2012-11-11 MED FILL — LIDOCAINE HCL (CARDIAC) 20 MG/ML IV SOLN: 20 MG/ML | INTRAVENOUS | Qty: 5

## 2012-11-11 MED FILL — SENSORCAINE-MPF/EPINEPHRINE 0.5% -1:200000 IJ SOLN: INTRAMUSCULAR | Qty: 30

## 2012-11-11 MED FILL — FENTANYL CITRATE 0.05 MG/ML IJ SOLN: 0.05 MG/ML | INTRAMUSCULAR | Qty: 4

## 2012-11-11 MED FILL — HYDROMORPHONE HCL 2 MG/ML IJ SOLN: 2 MG/ML | INTRAMUSCULAR | Qty: 1

## 2012-11-11 NOTE — H&P (Signed)
[facility]  History and Physical Update    Pt Name: Bradley Oconnell  MRN: [id number]  Birthdate: Nov 08, 1955  Date of evaluation: 11/11/2012    [x]  I have examined the patient and reviewed the H&P/Consult and there are no changes to the patient or plans.    []  I have examined the patient and reviewed the H&P/Consult and have noted the following changes:        Jock Mahon M. Nahla Lukin DPM  Electronically signed 11/11/2012 at 7:25 AM

## 2012-11-11 NOTE — Op Note (Signed)
[facility]                                    [place], [place]                                   RECORD OF OPERATION     PATIENT NAME: Bradley Oconnell, Bradley Oconnell               DOB: 1956-03-01  MEDICAL RECORD NO. [id number]                 ROOM: CE  ACCOUNT NO: [id number]                          DATE: 11/11/2012  SURGEON: Lucila Maine, D.P.M.        This is Valere Dross, D.P.M., dictating an operative report for Lucila Maine, D.P.M.     SURGEON:  Lucila Maine, D.P.M.     ASSISTANT:  Valere Dross, D.P.M. and Iona Beard, MS-3.     PREOPERATIVE DIAGNOSIS:       1.    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       2.    Elevated fifth digit, right foot.       3.    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       4.    Hammertoe contracture, fourth toe, left foot.     POSTOPERATIVE DIAGNOSIS:       1.    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       2.    Elevated fifth digit, right foot.       3.    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       5.    Hammertoe contracture, fourth toe, left foot.     PROCEDURE PERFORMED:       1.    Proximal interphalangeal joint arthrodesis, digits 3 and 4, right             foot.       2.    Weil osteotomy, metatarsals 2-4, right foot.       3.    Dorsal capsulotomy, fifth metatarsophalangeal joint, right foot.       4.    Plantar skin plasty, fifth digit, right foot.       5.    Percutaneous flexor tenotomy, fourth digit, left foot.     ANESTHESIA:  General.     ESTIMATED BLOOD LOSS:  5 ml.     HEMOSTASIS:  1 well padded pneumatic ankle tourniquet inflated to 250 mmHg.     MATERIALS:       1.   Two size large Smart Toe implants.      2.    Three 2.0x14 mm snap off screws.     INJECTABLES:  None.     PATHOLOGY:  None.     COMPLICATIONS:  None.     PROCEDURE:  The patient was transported from preop to OR and placed in a  supine position.  Pneumatic ankle tourniquet was applied to the right ankle.  After proper anesthesia was  obtained, the foot was then prepped and draped in  normal aseptic manner.     The foot was then exsanguinated and the  tourniquet was inflated to 250 mmHg.  Attention was directed to the third digit, where an incision was made over  the proximal interphalangeal joint.  Dissection was carried down to the  subcutaneous tissues, being careful to retract all vital and neurovascular  structures.  All bleeders were bovied and cauterized as necessary.  Once  dissection was carried down to the level of the extensor tendon, the extensor  tendon was then tenotomized at the proximal interphalangeal joint and  reflected proximally.  Next, utilizing a sagittal saw, the head of the  proximal phalanx and base of the middle phalanx was resected and placed on  the back table.  After this was done, a Smart Toe was opened on the back  table and inserted into the proximal interphalangeal joint, fusing it  together.  It was held together and allowed to expand.  This allowed for  excellent compression across the proximal interphalangeal joint.     Attention was directed to the fourth digit, where an incision was made over  the proximal interphalangeal joint.  Dissection was carried down to the  subcutaneous tissues, being careful to retract all vital and neurovascular  structures.  All bleeders were bovied and cauterized as necessary.  Once  dissection was carried down to the level of the extensor tendon, the extensor  tendon was then tenotomized at the proximal interphalangeal joint and  reflected proximally.  Next, utilizing a sagittal saw, the head of the  proximal phalanx and base of the middle phalanx was resected and placed on  the back table.  After this was done, a Smart Toe was opened on the back  table and inserted into the proximal interphalangeal joint, fusing it  together.  It was held together and allowed to expand.  This allowed for  excellent compression across the proximal interphalangeal joint.     Attention was directed  to the second metatarsophalangeal joint, where an  incision was made over the second MPJ.  Dissection was carried down to the  subcutaneous tissues, being careful to retract all vital and neurovascular  structures.  All bleeders were bovied and cauterized as necessary.  Once  dissection was carried down to the level of the capsule, the capsule was  incised linearly, thus exposing the head of the second metatarsal.  After  this was done, utilizing a sagittal saw, a Weil type osteotomy was made in  the metatarsal head.  After this was done, the capital fragment was then  translocated medially and a 14 mm 2.0 snap off screw was then inserted into  the second metatarsal head.  All rough edges were smoothed off with a  rongeur, allowing the metatarsophalangeal joint to have smooth range of  motion.  Next, this incision was then flushed with copious amounts of normal  sterile saline, and the deep capsular structures were closed with 3-0 Vicryl,  and the skin was reapproximated and coapted using 4-0 Prolene in a simple  interrupted and vertical mattress suture technique.     Attention was directed to the dorsal aspect of the fifth digit, where an  incision was made over the fifth proximal interphalangeal joint.  At this  time, the extensor tendon and capsule were then tenotomized and capsulotomy  was performed on the dorsal aspect of the fifth metatarsophalangeal joint.  This was then flushed with copious amounts of normal sterile saline, and then  incision was closed with 4-0 Prolene in a simple interrupted and vertical  mattress suture technique.  Attention was directed to the plantar aspect of the fifth digit, where an  elliptical skin plasty was removed from the plantar digital sulcus.  This was  then placed on the back table.  This was then reapproximated with 4-0  Monocryl in a simple interrupted and vertical mattress suture technique.  After this was done, the foot was cleansed and the tourniquet was  deflated.  An immediate hyperemic response was noted to the digits of the right foot.     Attention was then directed to left foot, where attention was directed to the  fourth digit of the left foot and utilizing a 61 blade, a percutaneous flexor  tenotomy was performed on the fourth digit of the left foot.  This allowed  the toe to float in a more corrected position, decreasing the contracture  hammertoe present on the fourth digit.  This was then flushed and splinted  down with 4x4s, Kerlix and an Ace wrap was applied.  A postop dressing of  Adaptic, 4x4s, Kerlix and Ace wrap was applied to the right foot as well.  Postop shoes were applied.  The patient tolerated both the procedure and  anesthesia well.  No complications were noted throughout the procedure.  The  patient was transported to PACU with vital signs stable and neurovascular  structures intact.  The patient will be discharged per PACU protocol.  The  patient will follow up with Dr. Erma Pinto in his office.           Valere Dross, D.P.M.        D: 11/11/2012 16:28                                   T: 11/11/2012 23:45  als     CC:  Valere Dross, D.P.M.  [address]  [place], [place] [address]     Lucila Maine, D.P.M.  [facility]  [address]  [place], [place] [address]

## 2012-11-11 NOTE — Anesthesia Pre-Procedure Evaluation (Signed)
Bradley Oconnell     Anesthesia Evaluation     Patient summary reviewed    Airway   Mallampati: II  TM distance: >3 FB  Neck ROM: full  Dental - normal exam     Pulmonary - normal exam    breath sounds clear to auscultation  Cardiovascular - negative ROS and normal exam    Rhythm: regular  Rate: normal    Neuro/Psych - negative ROS   GI/Hepatic/Renal - negative ROS     Endo/Other - negative ROS   Abdominal  - normal exam                  Allergies: Review of patient's allergies indicates no known allergies.    NPO Status: Time of last liquid consumption: 2345                       Time of last solid food consumption: 2345  [facility]  PRE-ANESTHESIA EVALUATION FORM       Name:  Bradley Oconnell                                         Age:  57 y.o.  MRN:  [id number]           No Known Allergies  Patient Active Problem List   Diagnosis   ??? ED (erectile dysfunction)   ??? Testosterone deficiency   ??? OA (osteoarthritis)   ??? Overactive bladder   ??? Chronic depression   ??? Obesity (BMI 30.0-34.9)     Past Medical History   Diagnosis Date   ??? Depression, recurrent    ??? ED (erectile dysfunction)    ??? OA (osteoarthritis)    ??? Testosterone deficiency    ??? Genital warts      Past Surgical History   Procedure Laterality Date   ??? Foot surgery Bilateral      x 4   ??? Hernia repair  2010   ??? Hemorrhoid surgery  2006     History   Substance Use Topics   ??? Smoking status: Former Smoker   ??? Smokeless tobacco: Never Used   ??? Alcohol Use: Yes      Comment: social     Medications  Current Outpatient Prescriptions on File Prior to Encounter   Medication Sig Dispense Refill   ??? finasteride (PROSCAR) 5 MG tablet Take 1 tablet by mouth daily.  30 tablet  11   ??? oxybutynin (DITROPAN) 5 MG tablet Take 1 tablet by mouth 2 times daily.  60 tablet  11   ??? tadalafil (CIALIS) 5 MG tablet Take 1 tablet daily  30 tablet  5   ??? meloxicam (MOBIC) 15 MG tablet Take 1 tablet by mouth daily.  30 tablet  11   ??? topiramate (TOPAMAX) 100 MG tablet Take  1 tablet by mouth 3 times daily.  90 tablet  11   ??? tranylcypromine (PARNATE) 10 MG tablet Take 1 tablet by mouth 3 times daily.  90 tablet  11   ??? testosterone (ANDROGEL) 50 MG/5GM GEL 1% gel Apply 5 g topically daily.  30 Package  5   ??? ketorolac (TORADOL) 10 MG tablet Take 1 tablet by mouth every 6 hours as needed.  30 tablet  1   ??? [DISCONTINUED] oxybutynin (DITROPAN) 5 MG tablet Take 1 tablet by mouth 2  times daily.  60 tablet  11     No current facility-administered medications on file prior to encounter.     Current Outpatient Prescriptions   Medication Sig Dispense Refill   ??? finasteride (PROSCAR) 5 MG tablet Take 1 tablet by mouth daily.  30 tablet  11   ??? oxybutynin (DITROPAN) 5 MG tablet Take 1 tablet by mouth 2 times daily.  60 tablet  11   ??? tadalafil (CIALIS) 5 MG tablet Take 1 tablet daily  30 tablet  5   ??? meloxicam (MOBIC) 15 MG tablet Take 1 tablet by mouth daily.  30 tablet  11   ??? topiramate (TOPAMAX) 100 MG tablet Take 1 tablet by mouth 3 times daily.  90 tablet  11   ??? tranylcypromine (PARNATE) 10 MG tablet Take 1 tablet by mouth 3 times daily.  90 tablet  11   ??? testosterone (ANDROGEL) 50 MG/5GM GEL 1% gel Apply 5 g topically daily.  30 Package  5   ??? ketorolac (TORADOL) 10 MG tablet Take 1 tablet by mouth every 6 hours as needed.  30 tablet  1   ??? [DISCONTINUED] oxybutynin (DITROPAN) 5 MG tablet Take 1 tablet by mouth 2 times daily.  60 tablet  11     Current Facility-Administered Medications   Medication Dose Route Frequency Provider Last Rate Last Dose   ??? 0.9 % sodium chloride infusion   Intravenous Continuous Dinah Beers, DPM       ??? sodium chloride flush 0.9 % injection 10 mL  10 mL Intravenous Q12H SCH Dinah Beers, DPM       ??? sodium chloride flush 0.9 % injection 10 mL  10 mL Intravenous PRN Dinah Beers, DPM       ??? ceFAZolin 1 gram (ANCEF) IVPB  1 g Intravenous Once Dinah Beers, DPM         Vitals   Filed Vitals:    11/11/12 0653   BP: 113/75   Pulse: 66   Temp:  97.7 ??F (36.5 ??C)   Resp: 16     BP Readings from Last 3 Encounters:   11/11/12 113/75   10/27/12 106/70   05/11/12 108/84     BMI  Ht Readings from Last 1 Encounters:   11/11/12 5\' 6"  (1.676 m)     Wt Readings from Last 1 Encounters:   11/11/12 190 lb (86.183 kg)     Body mass index is 30.68 kg/(m^2).  Estimated body mass index is 30.68 kg/(m^2) as calculated from the following:    Height as of this encounter: 5\' 6"  (1.676 m).    Weight as of this encounter: 190 lb (86.183 kg).    CBC   Lab Results   Component Value Date    WBC 5.8 05/22/2012    WBC 6.2 02/26/2010    RBC 4.48 05/22/2012    RBC 4.39 02/26/2010    HGB 14.2 05/22/2012    HCT 41.9 05/22/2012    MCV 93.5 05/22/2012    RDW 12.9 05/22/2012    PLT 184 05/22/2012     CMP    Lab Results   Component Value Date    NA 137 05/22/2012    K 4.2 05/22/2012    CL 108 05/22/2012    CO2 25 05/22/2012    BUN 25 05/22/2012    BUN 24 02/26/2010    CREATININE 1.2 05/22/2012    CREATININE 1.1 02/26/2010    LABGLOM 62 05/22/2012  GLUCOSE 101 05/22/2012    GLUCOSE 94 02/26/2010    PROT 7.3 05/22/2012    CALCIUM 8.7 05/22/2012    BILITOT 0.8 05/22/2012    ALKPHOS 60 05/22/2012    Oconnell 32 05/22/2012    ALT 26 05/22/2012     BMP    Lab Results   Component Value Date    NA 137 05/22/2012    K 4.2 05/22/2012    CL 108 05/22/2012    CO2 25 05/22/2012    BUN 25 05/22/2012    BUN 24 02/26/2010    CREATININE 1.2 05/22/2012    CREATININE 1.1 02/26/2010    CALCIUM 8.7 05/22/2012    LABGLOM 62 05/22/2012    GLUCOSE 101 05/22/2012    GLUCOSE 94 02/26/2010     POCGlucose  No results found for this basename: GLUCOSE,  in the last 72 hours   Coags    No results found for this basename: PROTIME, INR, APTT     HCG (If Applicable)   No results found for this basename: PREGTESTUR, PREGSERUM, HCG, HCGQUANT      ABGs   No results found for this basename: PHART, PO2ART, PCO2ART, HCO3ART, BEART, O2SATART      Type & Screen (If Applicable)  No results found for this basename: LABABO, LABRH     EKG (If Applicable)   CXR (If Applicable)    Cardiac Testing (If Applicable)   Anesthesia Plan    ASA 1     general     intravenous induction   Anesthetic plan and risks discussed with patient.    Plan discussed with attending.          Londell Moh Addie Alonge  11/11/2012

## 2012-11-11 NOTE — Discharge Instructions (Signed)
Dr. Mena Pauls Post Op Instructions    Pt Name: Bradley Oconnell  Medical Record Number: [id number]  Today's Date: 11/11/2012    ACTIVITY INSTRUCTIONS:  1. Go home, rest and take it easy  2. Elevate your feet by using 2 pillows and support the knee  3. Do not drink alcoholic beverages or use tobacco for 24 hours.    Walking as follows:                                                 Also use:         []  Light touch                                                     []  Wheelchair   [x]  Weight on foot as tolerated                           []  Crutches  []  No weight on foot                                          []  Walker   [x]  Left foot                                                   []  Roll A-bout  [x]  Right foot    WOUND/DRESSING INSTRUCTIONS:  Always ensure you and your care giver clean hands before and after caring for the wound.  [x]  Keep dressing clean and dry until you are seen in the offfice      [x]  Ice operative site for 15 minutes/ 15 minutes off 4 times per day   - do not apply ice directly to the skin    MEDICATIONS  Take pain medications as directed: Scripts were given on pre operative office visit  Take antibiotics as directed:    Call the office if any of the following happens:   Fever over 100.5 degrees by mouth   Blood soaked dressing (small amounts of oozing may be normal.)   Increased or progressive drainage from the surgical area   Inability to urinate or blood in the urine   Pain not relieved by the medications ordered   Persistent nausea and/or vomiting, unable to retain fluids.   Swelling, increased redness, warmth, or hardness around operative area   Numb, tingling or cold toes   Toe(s) become white or bluish   Wet dressing    In case of any problems or if any questions arise call the office or [facility] at [phone] and have the doctor paged.  Lucianne Muss office [phone]         Dinah Beers DPM  Lucila Maine, DPM

## 2012-11-11 NOTE — Anesthesia Post-Procedure Evaluation (Signed)
Anesthesia Post-op Note    [facility]  POST-ANESTHESIA NOTE       Name:  Bradley Oconnell                                         Age:  57 y.o.  MRN:  [id number]      Last Vitals:  BP 121/64   Pulse 87   Temp(Src) 98.2 ??F (36.8 ??C) (Temporal)   Resp 17   Ht 5\' 6"  (1.676 m)   Wt 190 lb (86.183 kg)   BMI 30.68 kg/m2   SpO2 91%  Patient Vitals for the past 4 hrs:   BP Temp Temp src Pulse Resp SpO2 Height Weight   11/11/12 0950 121/64 mmHg - - 87 17 - - -   11/11/12 0945 120/68 mmHg - - 73 12 91 % - -   11/11/12 0940 123/82 mmHg - - 86 20 93 % - -   11/11/12 0935 118/62 mmHg - - 85 18 96 % - -   11/11/12 0930 123/67 mmHg - - 70 10 96 % - -   11/11/12 0925 123/67 mmHg 98.2 ??F (36.8 ??C) Temporal 78 16 95 % - -   11/11/12 0653 113/75 mmHg 97.7 ??F (36.5 ??C) Temporal 66 16 98 % 5\' 6"  (1.676 m) 190 lb (86.183 kg)       Level of Consciousness:  Awake    Respiratory:  Stable    Oxygen Saturation:  Stable    Cardiovascular:  Stable    Hydration:  Adequate    PONV:  Stable    Post-op Pain:  Adequate analgesia    Post-op Assessment:  No apparent anesthetic complications    Additional Follow-Up / Treatment / Comment:  None    Robie Ridge, MD  November 11, 2012   10:05 AM

## 2012-11-11 NOTE — Brief Op Note (Signed)
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    Post-operative Diagnosis: Same    Procedure: PIPJ arthrodesis PIPJ 3-4 right, Weil osteotomy 2-4 right, Dorsal capsulotomy 5th MPJ right, Plantar skin plasty 5th digit right foot, Percutaneous flexor tenotomy 4th digit left foot    Anesthesia: General    Surgeons/Assistants: Kaylyn Layer MS III    Estimated Blood Loss: 5 ml    Complications: None    Specimens: Was Not Obtained    Findings: None    Electronically signed by Dinah Beers, DPM on 11/11/2012 at 9:27 AM

## 2012-11-13 MED ADMIN — iohexol (OMNIPAQUE 180) injection 20 mL: 2 mL | INTRAVENOUS | @ 14:00:00 | NDC 00407141120

## 2012-11-13 MED ADMIN — methylPREDNISolone acetate (DEPO-MEDROL) injection 80 mg: 80 mg | EPIDURAL | @ 14:00:00 | NDC 00009347501

## 2012-11-13 MED ADMIN — sterile water injection 1 mL: 1 mL | INTRAMUSCULAR | @ 14:00:00 | NDC 00409488710

## 2012-11-13 MED FILL — DEPO-MEDROL 80 MG/ML IJ SUSP: 80 MG/ML | INTRAMUSCULAR | Qty: 1

## 2012-11-13 MED FILL — LIDOCAINE HCL 2 % IJ SOLN: 2 % | INTRAMUSCULAR | Qty: 20

## 2012-11-13 MED FILL — STERILE WATER FOR INJECTION IJ SOLN: INTRAMUSCULAR | Qty: 10

## 2012-11-13 NOTE — Progress Notes (Signed)
9:37 AM PT ADMITTED TO [facility] FOR NERVE BLOCK #1 IN THIS SERIES  9:51 AM PT NPO SINCE LAST NIGHT  9:51 AM HAND GRASPS STRONG AND EQUAL.  9:59 AM TO X-RAY PER CART Bradley Oconnell 11/13/2012 10:00 AM   10:26 AM PT RETURNS FROM X-RAY-BAND AID DRY TO LOWER NECK AREA-NEURO STATUS AS BEFORE-FLUIDS OFFERED  11:02 AM UP IN ROOM-GAIT STEADY-UP TO BATH ROOM  11:02 AM WRITTEN DISCHARGE INSTRUCTIONS GIVEN TO PT-VERBALIZES UNDERSTANDING  11:02 AM PT DISCHARGED AMBULATORY Bradley Oconnell 11/13/2012 11:03 AM

## 2012-11-13 NOTE — Discharge Instructions (Signed)
NERVE BLOCK DISCHARGE INSTRUCTION    1.  Take it easy and rest the remainder of the day.  2.  Do not drive for the remainder of the day.  3.  You may use ice on the area of the injection to help alleviate discomfort.  Avoid heat for the remainder of the day.  4.  Do not soak in water or use a tub bath or hot tub for the remainder of the day.  5.  You may resume normal eating and drinking.  6. This evening you may resume your pain medications and any other medications you had stopped prior to the injection.  7.  Resume activities starting tomorrow in gradual manner.  Avoid activities with a lot of bending and twisting (golf, tennis, etc.) for at least a week.  You may resume physical therapy.  8.  Schedule an appointment to have another injection if this is the first or second of the series of three injections ordered.  Cancel 48 hrs prior the scheduled appointment if you are pain free.  Call [phone].  9.  Date: December 30, 2012   Time:  4:00 PM           GO DIRECTLY TO X-RAY AT 3:45 PM    Bring someone to drive you home.   Nothing to eat or drink 2 hrs prior.   No blood thinners or aspirin products 5 days prior.  10.  Notify Radiology [phone]), or go to the emergency room immediately if you develop any of the following symptoms: fever, chills, changes in mental status, severe pain, difficulty breathing, prolonged, severe headache, numbness or weakness in your arm or legs, loss of control of your bladder or bowel, excessive redness, swelling, or drainage from the area of injection.    ____________________________________________  Signature of patient, guardian, or other responsible party    Bradley Oconnell 11/13/2012 10:45 AM

## 2012-11-13 NOTE — Plan of Care (Signed)
MET____ Safety:       (Environmental)    Orient to environment    Ensure ID band is correct and in place/ allergy band as needed    Assess for fall risk    Initiate fall precautions as applicable (fall band, side rails, etc.)    Call light within reach    Bed in low position/ wheels locked    NM____ Pain:         Assess pain level and characteristics    Administer analgesics as ordered    Assess effectiveness of pain management and report to MD as needed    MET____ Knowledge Deficit:    Assess baseline knowledge    Provide teaching at level of understanding    Provide teaching via preferred learning method    Evaluate teaching effectiveness    MET____ Hemodynamic/Respiratory Status:       (Pre and Post Procedure Monitoring)    Assess/Monitor vital signs and LOC    Assess Baseline SpO2 prior to any sedation    Obtain weight/height    Assess vital signs/ LOC until patient meets discharge criteria    Monitor procedure site and notify MD of any issues

## 2012-11-13 NOTE — Progress Notes (Signed)
Department of Radiology  Post Procedure Progress Note      Pre-Procedure Diagnosis:  Cervical radiculopathy     Procedure Performed:  Epidural block/steroid injection      Anesthesia: local     Findings: successful    Immediate Complications:  None    Estimated Blood Loss: minimal    SEE DICTATED PROCEDURE NOTE FOR COMPLETE DETAILS.    Jacquelynn Cree MD   11/13/2012 10:16 AM

## 2012-11-13 NOTE — Progress Notes (Signed)
Procedure completed patient tolerated well. On cart taken to OPN in stable condition.

## 2012-11-16 ENCOUNTER — Telehealth

## 2012-11-16 MED ORDER — OXYBUTYNIN CHLORIDE 5 MG PO TABS
5 MG | ORAL_TABLET | Freq: Two times a day (BID) | ORAL | Status: DC
Start: 2012-11-16 — End: 2013-09-30

## 2012-11-16 MED ORDER — TOPIRAMATE 100 MG PO TABS
100 MG | ORAL_TABLET | Freq: Three times a day (TID) | ORAL | Status: DC
Start: 2012-11-16 — End: 2013-03-26

## 2012-11-16 MED ORDER — TRANYLCYPROMINE SULFATE 10 MG PO TABS
10 MG | ORAL_TABLET | Freq: Three times a day (TID) | ORAL | Status: DC
Start: 2012-11-16 — End: 2013-03-26

## 2012-11-16 NOTE — Telephone Encounter (Signed)
Requested meds in printer to mail to pt.

## 2012-11-16 NOTE — Telephone Encounter (Signed)
rx mailed to patient.

## 2012-11-16 NOTE — Telephone Encounter (Signed)
Patient found out that he can get some of his meds much cheaper is he uses his mail away company.  Can we write him Rx's for his Parnate, Topamax and Oxybutinin for 90 days x 1 yr & mail to him?   Call him back only if any problems

## 2012-11-30 ENCOUNTER — Telehealth

## 2012-11-30 NOTE — Telephone Encounter (Signed)
Patient was on My Chart looking at his labs and is concerned about his BUN & Creatinine.  Says that it is still in the normal range but is in the upper end of normal and have been going up every year.  Would like to have orders to have it repeated at [facility] since he is on Parnate & Ditropan.  Please call him back

## 2012-11-30 NOTE — Telephone Encounter (Signed)
Pt notified, orders faxed to [facility] central scheduling. Pt would like to know if his injections were ordered x3. Please send a my chart message.

## 2012-11-30 NOTE — Telephone Encounter (Signed)
Ok for bun and cr.

## 2012-12-16 LAB — BUN: BUN: 21 mg/dl (ref 7–22)

## 2012-12-16 LAB — GLOMERULAR FILTRATION RATE, ESTIMATED: Est, Glom Filt Rate: 77 mL/min/{1.73_m2} — AB

## 2012-12-16 LAB — CREATININE: Creatinine: 1 mg/dl (ref 0.4–1.2)

## 2012-12-17 NOTE — Telephone Encounter (Signed)
Message copied by Dorothea Glassman on Thu Dec 17, 2012  8:59 AM  ------       Message from: August Luz A       Created: Thu Dec 17, 2012  8:10 AM         Notify kidney function is improved.  GFR at 77.  No treatment needed for this.  ------

## 2012-12-17 NOTE — Telephone Encounter (Signed)
Spoke with Pt, attempted to give him test results, states he already has them from my chart. I informed Pt if he has any questions please call the office.

## 2012-12-21 ENCOUNTER — Encounter

## 2012-12-21 MED ORDER — MELOXICAM 15 MG PO TABS
15 MG | ORAL_TABLET | Freq: Every day | ORAL | Status: DC
Start: 2012-12-21 — End: 2013-03-03

## 2012-12-21 MED ORDER — FINASTERIDE 5 MG PO TABS
5 MG | ORAL_TABLET | Freq: Every day | ORAL | Status: DC
Start: 2012-12-21 — End: 2013-05-10

## 2012-12-21 NOTE — Telephone Encounter (Signed)
ep'ed requested meds to pharm requested

## 2012-12-21 NOTE — Telephone Encounter (Signed)
Patient is now getting his meds through [facility].  Can we send in Rx for his Mobic 15 mg and Proscar 5 mg.  Call him back if any questions.

## 2013-01-04 ENCOUNTER — Encounter

## 2013-01-06 ENCOUNTER — Encounter

## 2013-01-11 NOTE — Telephone Encounter (Signed)
Message copied by Sharen Hones on Mon Jan 11, 2013  3:41 PM  ------       Message from: [place], PROCESSOR       Created: Mon Jan 11, 2013  1:10 PM       Regarding: Non-Urgent Medical Question       Contact: [phone]         Is the appointment 7/14 at 10am a pre surgical visit? If it is necessary for me to come in I am on vacatiochn July 28-29.  May I change the app. to one of those days any time?       Thank you.       Bradley Oconnell  ------

## 2013-01-11 NOTE — Telephone Encounter (Signed)
Called pt and notified him that we can reschedule the appt, but he needs to have all the pre-op testing done prior to the appt. Pt states that he didn't get any pre-op testing last time, but he will call the Dr Mena Pauls office to double check.  Appt rescheduled for 02/15/2013.

## 2013-01-26 MED ADMIN — sterile water injection 1 mL: INTRAMUSCULAR | @ 20:00:00 | NDC 00409488710

## 2013-01-26 MED ADMIN — iohexol (OMNIPAQUE 180) injection 20 mL: 1 mL | INTRAVENOUS | @ 20:00:00 | NDC 00407141120

## 2013-01-26 MED ADMIN — methylPREDNISolone acetate (DEPO-MEDROL) injection 80 mg: 80 mg | EPIDURAL | @ 20:00:00 | NDC 00009347501

## 2013-01-26 MED FILL — DEPO-MEDROL 80 MG/ML IJ SUSP: 80 MG/ML | INTRAMUSCULAR | Qty: 1

## 2013-01-26 MED FILL — STERILE WATER FOR INJECTION IJ SOLN: INTRAMUSCULAR | Qty: 10

## 2013-01-26 MED FILL — LIDOCAINE HCL 2 % IJ SOLN: 2 % | INTRAMUSCULAR | Qty: 20

## 2013-01-26 NOTE — Progress Notes (Signed)
1540 procedure started per dr Corrie Dandykillough to back of neck area.pt placed on belly   1550 procedure completed. Pt tol well. bandaid applied. No bleeding, swelling noted . Pt assist up. Gait steady. Denies numbness, tingling to extremeties . Pt discharged without difficulty

## 2013-01-26 NOTE — Progress Notes (Signed)
FLUOROSCOPIC GUIDED CERVICAL EPIDURAL BLOCK    PROCEDURE PERFORMED:  Epidural Steroid Injection:  cervical Right C4-5 level(s)    PRE-PROCEDURE DIAGNOSIS:  Cervical radiculopathy    H&P STATUS: H&P was reviewed, the patient was examined and no change has occurred in patient's condition since H&P was completed.    POST-PROCEDURE DIAGNOSIS:  same    CONTRAST:  Omnipaque 180 2 ml    SEDATION:  none    APPROACH:  Right C4-5 intralaminar    LEVEL:  R C4-5    PRELIMINARY FINDINGS:  anteriolisthesis with canal stenosis at C4-5    ESTIMATED BLOOD LOSS:  Minimal    COMPLICATIONS:  none    After the procedure had been explained to the patient, including the overall details of the procedure, the associated benefits, risks, and uncertainties, the patient voiced understanding and expressed desire to proceed, freely signing the consent form.    He was then positioned on the fluoroscopy table in a prone position, with the above level identified fluoroscopically and this area subsequently prepped with betadine solution and draped with sterile paper drape.  2% Lidocaine was then infiltrated into the skin and subcutaneous tissues for local anesthetic.  A 22 ga spinal needle was then inserted into the epidural space with confirmation by injection of above volume of contrast.  A spot image was obtained for documentation.  Following this, Depo-Medrol (methyprednisolone 80 mg/ml) mixed in 2 ml Sterile water was injected without difficulty.  The needle was removed, the skin cleaned, and a small bandage applied.  He was then taken back to outpatient nursing in stable to improved condition where He was monitored for a brief period of time and subsequently discharged in satisfactory condition.

## 2013-01-29 LAB — COMPREHENSIVE METABOLIC PANEL
ALT: 20 U/L (ref 11–66)
AST: 20 U/L (ref 5–40)
Albumin: 4.3 gm/dl (ref 3.5–5.1)
Alkaline Phosphatase: 70 U/L (ref 38–126)
BUN: 15 mg/dl (ref 7–22)
CO2: 21 meq/l — ABNORMAL LOW (ref 23–33)
Calcium: 8.8 mg/dl (ref 8.5–10.5)
Chloride: 108 meq/l (ref 98–111)
Creatinine: 1 mg/dl (ref 0.4–1.2)
Glucose: 89 mg/dl (ref 70–108)
Potassium: 4.6 meq/l (ref 3.5–5.2)
Sodium: 141 meq/l (ref 135–145)
Total Bilirubin: 0.2 mg/dl — ABNORMAL LOW (ref 0.3–1.2)
Total Protein: 7.1 gm/dl (ref 6.1–8.0)

## 2013-01-29 LAB — CBC
Hematocrit: 41.6 % — ABNORMAL LOW (ref 42.0–52.0)
Hemoglobin: 14.1 gm/dl (ref 14.0–18.0)
MCH: 31.9 pg — ABNORMAL HIGH (ref 27.0–31.0)
MCHC: 33.9 gm/dl (ref 33.0–37.0)
MCV: 94.1 fL — ABNORMAL HIGH (ref 80.0–94.0)
MPV: 8 mcm (ref 7.4–10.4)
Platelets: 171 10*3/uL (ref 130–400)
RBC: 4.42 10*6/uL — ABNORMAL LOW (ref 4.70–6.10)
RDW: 13.4 % (ref 11.5–14.5)
WBC: 6.1 10*3/uL (ref 4.8–10.8)

## 2013-01-29 LAB — GLOMERULAR FILTRATION RATE, ESTIMATED: Est, Glom Filt Rate: 77 mL/min/{1.73_m2} — AB

## 2013-01-30 LAB — EKG 12-LEAD
Atrial Rate: 61 {beats}/min
P Axis: 69 degrees
P-R Interval: 170 ms
Q-T Interval: 400 ms
QRS Duration: 104 ms
QTc Calculation (Bazett): 402 ms
R Axis: 69 degrees
T Axis: 53 degrees
Ventricular Rate: 61 {beats}/min

## 2013-02-15 NOTE — Progress Notes (Signed)
Pre-op form and office note faxed to Dr Erma Pinto and [facility].

## 2013-02-15 NOTE — Progress Notes (Signed)
Have you seen any other physician or provider since your last visit yes Dr. Erma PintoHaycock and Dr. Oneida ArenasMisson    Have you had any other diagnostic tests since your last visit? yes - labs, EKG, and CXR    Have you changed or stopped any medications since your last visit including any over-the-counter medicines, vitamins, or herbal medicines? no     Are you taking all your prescribed medications? Yes    If NO, why?

## 2013-02-15 NOTE — Progress Notes (Signed)
Subjective:      Patient ID: Bradley Oconnell is a 57 y.o. male.    HPI  Pt here for pre-op physical at the request of Dr. Erma Pinto and Dr. Oneida Arenas.  Scheduled to have left foot surgery on 03/03/2013 and Right shoulder surgery on 03/12/2013.  Pt. Has a normal functional capacity.  No stress testing or heart caths in the last 5 years.  Denies any chest pain or SOB.  Reviewed pre -op blood work, EKG and CXR, all were good.  Reviewed BMI of 31.  Encouraged diet, exercise and weight loss.  Divorced, former smoker, pmh reviewed.  Review of Systems   Constitutional: Negative for fever, chills, fatigue and unexpected weight change.   HENT: Negative for ear pain, congestion, sore throat, rhinorrhea and neck pain.    Eyes: Negative for pain and visual disturbance.   Respiratory: Negative for cough, chest tightness, shortness of breath and wheezing.    Cardiovascular: Negative for chest pain and palpitations.   Gastrointestinal: Negative for nausea, vomiting, abdominal pain, diarrhea, constipation and blood in stool.   Genitourinary: Negative for urgency, frequency, hematuria and difficulty urinating.   Musculoskeletal: Negative for myalgias, back pain and joint swelling.        Left foot and right shoulder pain   Skin: Negative for rash.   Neurological: Negative for dizziness and headaches.   Hematological: Negative for adenopathy. Does not bruise/bleed easily.   Psychiatric/Behavioral: Negative for behavioral problems and sleep disturbance. The patient is not nervous/anxious.      Past Medical History   Diagnosis Date   ??? Depression, recurrent (HCC)    ??? ED (erectile dysfunction)    ??? OA (osteoarthritis)    ??? Testosterone deficiency    ??? Genital warts    ??? Cervical pain (neck)      Past Surgical History   Procedure Laterality Date   ??? Foot surgery Bilateral      x 4   ??? Hernia repair  2010   ??? Hemorrhoid surgery  2006   ??? Colonoscopy       No Known Allergies  Current outpatient prescriptions:meloxicam (MOBIC) 15 MG tablet, Take  1 tablet by mouth daily., Disp: 90 tablet, Rfl: 2, ;  finasteride (PROSCAR) 5 MG tablet, Take 1 tablet by mouth daily., Disp: 90 tablet, Rfl: 2, ;  tranylcypromine (PARNATE) 10 MG tablet, Take 1 tablet by mouth 3 times daily., Disp: 270 tablet, Rfl: 3, ;  topiramate (TOPAMAX) 100 MG tablet, Take 1 tablet by mouth 3 times daily., Disp: 270 tablet, Rfl: 3,   oxybutynin (DITROPAN) 5 MG tablet, Take 1 tablet by mouth 2 times daily., Disp: 180 tablet, Rfl: 3, ;  ketorolac (TORADOL) 10 MG tablet, Take 1 tablet by mouth every 6 hours as needed., Disp: 30 tablet, Rfl: 1, ;  [DISCONTINUED] oxybutynin (DITROPAN) 5 MG tablet, Take 1 tablet by mouth 2 times daily., Disp: 60 tablet, Rfl: 11,   History     Social History   ??? Marital Status: Divorced     Spouse Name: N/A     Number of Children: N/A   ??? Years of Education: N/A     Occupational History   ??? Not on file.     Social History Main Topics   ??? Smoking status: Former Smoker   ??? Smokeless tobacco: Never Used   ??? Alcohol Use: Yes      Comment: social   ??? Drug Use: Not on file   ??? Sexual Activity: Not  on file     Other Topics Concern   ??? Not on file     Social History Narrative     Family History   Problem Relation Age of Onset   ??? Parkinsonism Mother    ??? Heart Disease Father      Congestive Heart       Objective:   Physical Exam   Constitutional: He is oriented to person, place, and time. He appears well-developed and well-nourished.   HENT:   Head: Normocephalic and atraumatic.   Right Ear: External ear normal.   Left Ear: External ear normal.   Nose: Nose normal.   Mouth/Throat: Oropharynx is clear and moist.   Eyes: EOM are normal. Pupils are equal, round, and reactive to light.   Neck: Neck supple. Carotid bruit is not present. No thyromegaly present.   Cardiovascular: Normal rate, regular rhythm and normal heart sounds.    Pulmonary/Chest: Breath sounds normal. He has no wheezes. He has no rales.   Abdominal: Soft. Bowel sounds are normal. There is no tenderness. There  is no rebound and no guarding.   Musculoskeletal: He exhibits no edema.        Right shoulder: He exhibits decreased range of motion and tenderness.        Left foot: There is deformity.        Feet:    Lymphadenopathy:     He has no cervical adenopathy.   Neurological: He is alert and oriented to person, place, and time. He has normal reflexes. No cranial nerve deficit.   Skin: Skin is warm and dry. No rash noted.   Psychiatric: He has a normal mood and affect.   Nursing note and vitals reviewed.    Filed Vitals:    02/15/13 1610   BP: 100/60   Pulse: 80   Temp: 98.1 ??F (36.7 ??C)   TempSrc: Oral   Resp: 16   Height: 5\' 7"  (1.702 m)   Weight: 195 lb 9.6 oz (88.724 kg)     Assessment:      Pre-op physical  Right rotator cuff tear  Left foot bunion         Plan:      After reviewing pre-op work up, he is cleared for proposed surgeries  Note to Haycock and Misson.

## 2013-03-02 NOTE — Discharge Instructions (Signed)
Post Op Instructions    Pt Name: Bradley Oconnell  Medical Record Number: [id number]  Today's Date: 03/02/2013    GENERAL ANESTHESIA OR SEDATION  1. Do not drive or operate hazardous machinery for 24 hours.  2. Do not make important business or personal decisions for 24 hours.  3. Do not drink alcoholic beverages or use tobacco for 24 hours.    ACTIVITY INSTRUCTIONS:  []  Rest today. Resume light to normal activity tomorrow.   [x]  You may ambulate as tolerated in your post op shoe/boot.  []  No weight is to be placed on the operative limb.  Use crutches, walker, Roll-a-bout as needed.  [x] Elevate the operative limb as much as possible to reduce swelling and discomfort for the first 72 hours      DIET INSTRUCTIONS:  [] Begin with clear liquids. If not nauseated, may increase to a low-fat diet when you desire.   [x] Regular diet as tolerated.  [] Other:     MEDICATIONS  [x] Prescription sent with you to be used as directed.   [] Toradol   [] Vicodin   [x] Percocet   [] Tylenol #3   [] Duricef   Do not drink alcohol or drive while taking these medications. You may experience dizziness or drowsiness with these medications. You may also experience constipation which can be relieved with stool softners or laxatives.  [x] You may resume your daily prescription medication schedule unless otherwise specified.  [x] If taking 325mg  Aspirin or other blood thinners such as Coumadin or Plavix, you may resume tomorrow, unless told otherwise.     WOUND/DRESSING INSTRUCTIONS:  Always ensure you and your care giver clean hands before and after caring for the wound.  [x]  Keep dressing clean and dry until you are seen in the offfice      [x]  Ice operative site for 20 minutes 4 times a day.   - you may apply ice bag behind the knee or directly on the foot/ankle bandage.   - do not apply ice directly to the skin  [x]  You may loosen the ACE wrap if it feels too tight, but do not disturb the underlying white gauze wrap.      FOLLOW-UP CARE. SPECIFICALLY  WATCH FOR:   Fever over 101 degrees by mouth   Increased redness, warmth, hardness at operative site.   Blood soaked dressing (small amounts of oozing may be normal.)   Increased or progressive drainage from the surgical area   Inability to urinate or blood in the urine   Pain not relieved by the medications ordered   Persistent nausea and/or vomiting, unable to retain fluids.   Swelling, increased redness, warmth, or hardness around operative area   Numb, tingling or cold toes   Toe(s) become white or bluish    FOLLOW-UP APPOINTMENT   [] 1 week   [] 2 weeks   [x] Other, As prevously scheduled (8.18.2014)    Call my office if you have any problem that concerns you [phone]. After hours, you can reach the answering service via the office phone number. IF YOU NEED IMMEDIATE ATTENTION, GO TO THE EMERGENCY ROOM AND YOUR DOCTOR WILL BE CONTACTED.      Dr. Lucila Mainearryl Haycock, DPM    Daiva EvesNathan Hensley, D.P.M.  Podiatric Surgical Resident  Cell # [phone]  03/02/2013  6:03 PM

## 2013-03-03 MED ORDER — ACETAMINOPHEN 325 MG PO TABS
325 | ORAL | Status: DC | PRN
Start: 2013-03-03 — End: 2013-03-04

## 2013-03-03 MED ORDER — FENTANYL CITRATE 0.05 MG/ML IJ SOLN
0.05 | INTRAMUSCULAR | Status: DC | PRN
Start: 2013-03-03 — End: 2013-03-04

## 2013-03-03 MED ORDER — PERCOCET 5-325 MG PO TABS
5-325 MG | ORAL_TABLET | ORAL | Status: AC
Start: 2013-03-03 — End: 2013-03-10

## 2013-03-03 MED ORDER — HYDROMORPHONE HCL 2 MG/ML IJ SOLN
2 | INTRAMUSCULAR | Status: DC | PRN
Start: 2013-03-03 — End: 2013-03-04

## 2013-03-03 MED ORDER — OXYCODONE-ACETAMINOPHEN 5-325 MG PO TABS
5-325 | ORAL | Status: DC | PRN
Start: 2013-03-03 — End: 2013-03-04

## 2013-03-03 MED ORDER — SULFAMETHOXAZOLE-TRIMETHOPRIM 800-160 MG PO TABS
800-160 MG | ORAL_TABLET | Freq: Two times a day (BID) | ORAL | Status: AC
Start: 2013-03-03 — End: 2013-03-13

## 2013-03-03 MED ADMIN — ceFAZolin (ANCEF) 2 g in dextrose 3% 50mL IVPB (duplex): INTRAVENOUS | @ 20:00:00 | NDC 00264310511

## 2013-03-03 MED FILL — DEXAMETHASONE SODIUM PHOSPHATE 20 MG/5ML IJ SOLN: 20 MG/5ML | INTRAMUSCULAR | Qty: 5

## 2013-03-03 MED FILL — HYDROMORPHONE HCL 2 MG/ML IJ SOLN: 2 MG/ML | INTRAMUSCULAR | Qty: 1

## 2013-03-03 MED FILL — DEPO-MEDROL 80 MG/ML IJ SUSP: 80 MG/ML | INTRAMUSCULAR | Qty: 1

## 2013-03-03 MED FILL — DIPRIVAN 10 MG/ML IV EMUL: 10 MG/ML | INTRAVENOUS | Qty: 20

## 2013-03-03 MED FILL — SENSORCAINE-MPF/EPINEPHRINE 0.5% -1:200000 IJ SOLN: INTRAMUSCULAR | Qty: 30

## 2013-03-03 MED FILL — FENTANYL CITRATE 0.05 MG/ML IJ SOLN: 0.05 MG/ML | INTRAMUSCULAR | Qty: 2

## 2013-03-03 MED FILL — ONDANSETRON HCL 4 MG/2ML IJ SOLN: 4 MG/2ML | INTRAMUSCULAR | Qty: 2

## 2013-03-03 MED FILL — MIDAZOLAM HCL 2 MG/2ML IJ SOLN: 2 MG/ML | INTRAMUSCULAR | Qty: 2

## 2013-03-03 MED FILL — CEFAZOLIN SODIUM 1 G IJ SOLR: 1 g | INTRAMUSCULAR | Qty: 2000

## 2013-03-03 NOTE — Op Note (Signed)
[facility]                                    [place], [place]                                   RECORD OF OPERATION     PATIENT NAME: Bradley Oconnell, Bradley Oconnell               DOB: 1955/09/19  MEDICAL RECORD NO. [id number]                 ROOM: SE  ACCOUNT NO: [id number]                          DATE: 03/03/2013  SURGEON: Dr. Lucila Maine         This dictation is on behalf of Lucila Maine, D.P.M.     PREOPERATIVE DIAGNOSIS:       1.    HAV deformity, left first MPJ.       2.    Painful retained hardware, left first MPJ.       3.    Exostosis, left second PIPJ.       4.    Exostosis, left third PIPJ.       5.    Contracture deformity left third MPJ.       6.    Contracture deformity of left fourth PIPJ.     POSTOPERATIVE DIAGNOSIS:       1. HAV deformity, left first MPJ.       2. Painful retained hardware, left first MPJ.       3. Exostosis, left second PIPJ.       4. Exostosis, left third PIPJ.       5. Contracture deformity left third MPJ.       6. Contracture deformity of left fourth PIPJ.     PROCEDURE:       1.    HAV correction with metatarsal osteotomy left first metatarsal.       2.    Hardware removal left first metatarsal.       3.    Akin proximal phalangeal osteotomy left first digit.       4.    Exostectomy left second PIPJ.       5.    Exostectomy left third PIPJ.       6.    Dorsal capsulotomy left third MPJ.       7.    PIPJ arthrodesis left fourth digit.     SURGEON:  Lucila Maine, D.P.M.     ASSISTANT:       1.    Leyli Kevorkian T. Adron Bene, D.P.M.       2.    Eveline Keto, medical student year 4.     ANESTHESIA:  General with the addition of 30 ml of 0.5% Marcaine with  epinephrine. Hemostasis was a well-padded pneumatic ankle tourniquet set at  258 mmHg.     ESTIMATED BLOOD LOSS:  Estimated blood loss was tourniquet.     MATERIALS USED:       1.    OrthoHelix EdgeLock plate.       2.    2.7 mm locking screws.       3.    One 9 x  7 Os staple.       4.    One 7 x 5 mm Os staple.        5.    One 0.45 K wire.       6.    One 0.62 K wire.     INJECTABLES:  7 ml mixture of 1 ml 80 mg per ml Kenalog.  3 ml of 4 mg per ml  of Dexamethasone phosphate.  3 ml of 0.5 percent Marcaine with epinephrine.     COMPLICATIONS:  None.     CONDITION:  Stable.     SPECIMENS:  Not obtained.     INDICATIONS FOR THE PROCEDURE:  The patient is well known to Dr. Marlana Salvage  in the outpatient setting.  The patient has a history of presenting to his  office complaining of longstanding left forefoot pain.  The had undergone  extensive previous forefoot reconstruction to no avail.  The patient has  continued to ambulate with pain in the postoperative setting for several  years.  The patient presented to this office for secondary consult.  At this  point in time the patient wishes to seek further surgical intervention.  All  complications and risks were highlighted in the preoperative consent.  The  patient consented to do the procedures.     The patient was seen in preoperative holding.  All questions and concerns  regarding the procedure were answered to the patient's satisfaction and in  detail.  The consent was reviewed and signed.  The operative limb was marked  by Dr. Marlana Salvage and the patient was transported to the Operating Room and  placed in supine position.  The patient received general anesthesia.  The  patient also received approximately 30 ml of 0.5 percent Marcaine with  epinephrine intraoperative.  The foot was scrubbed with chlorhexidine  surgical scrub.  The patient received approximately 2 g of Ancef as  prophylactic antibiotic.     PROCEDURES IN DETAIL:       1.   HAV correction with metatarsal osteotomy of the left first MPJ.             Initial incision was made over the dorsomedial aspect of the left             first MPJ approximately 6 cm in length extending from the proximal             base of the metatarsal to the mid diaphyseal after the proximal             phalanx.  Initial incision was made  with a 58 scalpel blade full             thickness making sure to avoid all vital neurovascular structures             and cauterize all small bleeding vessels.  Blunt dissection was             then carried down to the level of the overlying capsule which was             incised with a fresh 13 blade.  Attention was directed to the             first MPJ itself.  The dorsal capsule was incised allowing for             visualization of the underlying cartilage.  Medial and lateral  capsules were released allowing for complete mobilization of the             metatarsal itself.  The TPS sagittal saw was then used to create a mid             diaphyseal osteotomy in an oblique type fashion.  The distal             capital fragment was then transposed approximately 4 mm and             temporarily fixated with a 6.2 K-wire.  The jig for the OrthoHelix             EdgeLock plate was then tamped into place down the medullary canal             and then proximal aspect of the metatarsal.  This was then drilled             to appropriate length and then removed.  The actual implant was             then implanted and noted to have adequate fit within the medullary             canal.  The distal 2 screw holes were then             drilled to appropriate length 2.7 mm locking screws were then             placed in the distal and plantar screw holes.  Attention was then             directed to the interfragmentary screw hole itself which was             drilled to length as well and measuring under fluoroscopy the             appropriate length 2.7 mm non-locking screw was placed.  Adequate fixation was noted.  An initial 6.2 K-wire was             used for temporary fixation, was deemed to be in adequate position             and tamped into place and left for permanent fixation.       2.   Hardware removal left first metatarsal following correction of the             HAV deformity of the left first metatarsal.  All the  residual             hardware from the previous surgery including an old K wire,             staple, and suture buttons were removed.       3.   Akin proximal phalangeal osteotomy of left first digit following             completion of the HAV correction and metatarsal itself.  A wedge based             osteotomy was performed with a medial 3 mm wedge with the TPS             sagittal saw. Following removal of the wedge, the osteotomy was             compressed noting rectus line of the digit.  The appropriate size             drill bit for a 9 x 7 Os staple was then obtained and drilled at  the distal and proximal aspects of the osteotomy.  Staple was then             placed and the femoral coupler was used to approximate the staple             line for adequate compression across the osteotomy site. Upon             completion of the HAV correction and the proximal phalangeal             osteotomy, the surgical site was flushed with copious amounts of             normal sterile saline.  The capsule was then reapproximated using             3-0 Vicryl.  The skin was reapproximated using 4-0 Prolene             respectively.       4.    Exostectomy of left second PIPJ.  A small stab incision was made on          the dorsal aspect of the PIPJ of the left second digit.  Blunt          dissection was then carried down to the level of the base of the          middle phlange of the head of the proximal phlange.  TPS sagittal saw          then used to remove the hypertrophic bone formation on the medial          aspect of the PIPJ of the second digit.  Surgical site was flushed          with copious amounts of normal sterile saline.   Osseous bone          fragment was removed.  The skin was reapproximated using 4-0 Prolene.       5.    Exostectomy of the left third PIPJ.  A small stab incision was made          on the dorsal aspect of the PIPJ of the left third digit.  Blunt          dissection was then  carried down to the level of the base of the          middle phlange of the head of the proximal phlange.  TPS sagittal saw          then used to remove the hypertrophic bone formation on the medial          aspect of the PIPJ of the third digit.  Surgical site was flushed          with copious amounts of normal sterile saline.   Osseous bone          fragment was removed.  The skin was reapproximated using 4-0 Prolene.       6.    Dorsal capsulotomy left third MPJ.  A small incision was made          overlying the dorsal aspect of the left third metatarsophalangeal          joint.  Initial incision was made longitudinally approximately 2 cm          in length with a 15 scalpel blade making sure to avoid all vital          neurovascular structures and cauterize all small bleeding vessels.  The dorsal aspect of the capsule was visualized and transversely cut          with a fresh 15 scalpel blade allowing for visualization of the          underlying of the underlying articular cartilage as well as plantar          alignment of the digit itself.  The surgical site was flushed with          copious amounts of normal sterile saline. The skin was reapproximated          using 4-0 Prolene.       7.    PIPJ arthrodesis left fourth PIPJ.  Attention was then directed to          the dorsal aspect of the proximal interphalangeal joint of the fourth          digit.  Initial skin incision was made overlying the dorsal aspect of          the PIPJ with 15 scalpel blade.  The skin edges were carefully          retracted making sure to avoid all vital neurovascular structures and          cauterize all small bleeding vessels.  Transverse tenotomy of the          long extensor tendon was performed allowing for visualization of the          underlying articular cartilage.  The sagittal saw was then used to          resect the articular cartilage of the proximal phalangeal head as          well as the base of the middle  phlange.  At this point in time a 0.45          K-wire was then used to drill through the intramedullary canal as          well as resected bones.  This was then cut to length and left as          permanent fixation.  An appropriate size drill bit was then obtained          and drilled in the distal and proximal aspects of the arthrodesis          site and a 7 x 5 mm Os staple was placed and compressed.  The          biothermal coupler was then applied to the staple line for          compression of the arthrodesis site.  The surgical site was then          flushed with copious amounts of normal sterile saline and deemed to          be in adequate position under fluoroscopy.  The skin was then          reapproximated using 4-0 Prolene.  The foot was then washed with          copious amounts of normal sterile saline and a nonadherent dressing          consisting of Adaptic was applied over the incision sites and a dry          sterile dressing then applied consisting of gauze, Kerlix,          stockinette, Ace bandage.  The patient was placed in a postoperative          pneumatic  boot.  The patient tolerated the procedure well.  Following          completion of all procedures on the left foot a secondary procedure          was performed on the right foot consisting of a 7 ml mixture of 1 ml          of 80 ml of Kenalog, 3 ml of 4 mg/ml Dexamethasone phosphate, 3 ml of          0.5 percent Marcaine with epinephrine to the canalis tarsi on the          medial aspect of the right foot.  The patient tolerated injection          well with application of Band-Aid.  The patient tolerated all stated          procedures well.  The patient was transferred to the Post Anesthesia          Care Unit in normal, stable condition.                    Nada Maclachlan. Aziyah Provencal, D.P.M.        D: 03/03/2013 22:17                                   T: 03/04/2013 11:58  ams     CC:  Harrold Donath T. Adron Bene, D.P.M.  [address]  [place], [place] [address]      Lucila Maine, D.P.M.  [facility]  [address]  [place], [place] [address]

## 2013-03-03 NOTE — Progress Notes (Signed)
1815 Ride called, on their way. Denies needs. Pop given, refuses crackers/snacks.  1831 Dressing for discharge, family here.  1838 To bathroom with 1 assist, voids without difficulty. Gait steady.  1844 Discharge instructions given, understanding voiced, questions answered. Patient hurried to leave and go home.

## 2013-03-03 NOTE — Progress Notes (Signed)
Patient had surgery 10/2012 on right foot.   States is scheduled for surgery on shoulder in 2 weeks.

## 2013-03-03 NOTE — Brief Op Note (Signed)
Brief Postoperative Note   03/03/2013  Bradley Oconnell  57 y.o.      Surgeon: Dr. Lucila Maine , DPM    Assistants: Jasmine December, DPM                      Maye Hides, MSIV    Pre-operative Diagnosis:    1. HAV deformity, left 1st MPJ   2. Painful retained hardware, left 1st MPJ   3. Exostosis, left 2nd PIPJ   4. Exostosis, left 3rd PIPJ   5. Contracture deformity, left 3rd MPJ   6. Contracture deformity, left 4th PIPJ    Post-operative Diagnosis:    Same    Procedure:    1. HAV correction with metatarsal osteotomy, left 1st metatarsal   2. Hardware removal, left 1st metatarsal   3. Akin proximal phalangeal osteotomy, left 1st digit   4. Exostectomy, left 2nd PIPJ   5. Exostectomy, left 3rd PIPJ   6. Dorsal capsulotomy, left 3rd MPJ   7. PIPJ arthrodesis, left 4th PIPJ    Anesthesia: general, 30cc 0.5% marcaine + epi    Hemostasis: A well padded pneumatic ankle Tourniquet at 250 mmHG     Estimated Blood Loss: tourniquet    Materials: (1) Orthohelix edge lock plate, (3) 1.6XW screws, (1) 9x62mm os staple, (1) 7x45mm os staple, (1) 0.45 kwire (1) 0.62 kwire    Injectables: 7cc mixture of 1cc 80mg /ml kenalog, 3cc 4mg /ml dexamethasone phosphate, 3cc 0.5% marcaine + epinephrine    Condition: stable    Complications: none     Specimens: were not obtained      Jasmine December, DPM  Podiatric Surgical Resident  [phone]  03/03/2013     Electronically signed by Jasmine December, DPM on 03/03/2013 at 5:54 PM

## 2013-03-03 NOTE — Progress Notes (Signed)
Patient on Parnate. So Demerol is contraindicated with this medication  Patient denies any recent cold or need to be checked by physician.

## 2013-03-03 NOTE — Anesthesia Pre-Procedure Evaluation (Signed)
[facility]  PRE-ANESTHESIA EVALUATION FORM  Provider - Alvira Philips. Keyanni Whittinghill    Pt Name: Bradley Oconnell  MRN: [id number]  Birthdate: [birth year]  Date of evaluation: 03/03/2013     PATIENT INTERVIEW  Past Medical History:      Comments:     has a past medical history of Depression, recurrent (HCC); ED (erectile dysfunction); OA (osteoarthritis); Testosterone deficiency; Genital warts; and Cervical pain (neck).    Past Surgical History:     Comments:   has past surgical history that includes Foot surgery (Bilateral); hernia repair (2010); Hemorrhoid surgery (2006); and Colonoscopy.    Medications:    Comments:    Current outpatient prescriptions:finasteride (PROSCAR) 5 MG tablet, Take 1 tablet by mouth daily., Disp: 90 tablet, Rfl: 2, ;  tranylcypromine (PARNATE) 10 MG tablet, Take 1 tablet by mouth 3 times daily., Disp: 270 tablet, Rfl: 3, ;  topiramate (TOPAMAX) 100 MG tablet, Take 1 tablet by mouth 3 times daily., Disp: 270 tablet, Rfl: 3, ;  oxybutynin (DITROPAN) 5 MG tablet, Take 1 tablet by mouth 2 times daily., Disp: 180 tablet, Rfl: 3,   meloxicam (MOBIC) 15 MG tablet, Take 1 tablet by mouth daily., Disp: 90 tablet, Rfl: 2, ;  ketorolac (TORADOL) 10 MG tablet, Take 1 tablet by mouth every 6 hours as needed., Disp: 30 tablet, Rfl: 1, ;  [DISCONTINUED] oxybutynin (DITROPAN) 5 MG tablet, Take 1 tablet by mouth 2 times daily., Disp: 60 tablet, Rfl: 11,   Current facility-administered medications:0.9 % sodium chloride infusion, , Intravenous, Continuous, Nathan T Hensley, DPM;  sodium chloride flush 0.9 % injection 10 mL, 10 mL, Intravenous, Q12H SCH, Nathan T Hensley, DPM;  sodium chloride flush 0.9 % injection 10 mL, 10 mL, Intravenous, PRN, Jasmine December, DPM;  ceFAZolin (ANCEF) 2 g in dextrose 3% 50mL IVPB (duplex), 2 g, Intravenous, On Call to OR, Jasmine December, DPM  0.9 % sodium chloride infusion, , Intravenous, Continuous, Jasmine December, DPM;  sodium chloride flush 0.9 % injection 10 mL, 10  mL, Intravenous, Q12H SCH, Jasmine December, DPM;  sodium chloride flush 0.9 % injection 10 mL, 10 mL, Intravenous, PRN, Jasmine December, DPM;  acetaminophen (TYLENOL) tablet 650 mg, 650 mg, Oral, Q4H PRN, Jasmine December, DPM  magnesium hydroxide (MILK OF MAGNESIA) 400 MG/5ML suspension 30 mL, 30 mL, Oral, Daily PRN, Jasmine December, DPM;  ondansetron (ZOFRAN) injection 4 mg, 4 mg, Intravenous, Q6H PRN, Jasmine December, DPM;  oxyCODONE-acetaminophen (PERCOCET) 5-325 MG per tablet 1 tablet, 1 tablet, Oral, Q4H PRN, Jasmine December, DPM;  oxyCODONE-acetaminophen (PERCOCET) 5-325 MG per tablet 2 tablet, 2 tablet, Oral, Q4H PRN, Jasmine December, DPM  Prior to Admission medications    Medication Sig Start Date End Date Taking? Authorizing Provider   finasteride (PROSCAR) 5 MG tablet Take 1 tablet by mouth daily. 12/21/12 12/21/13 Yes Georges Lynch Ringwald, MD   tranylcypromine (PARNATE) 10 MG tablet Take 1 tablet by mouth 3 times daily. 11/16/12  Yes Rosamaria Lints, MD   topiramate (TOPAMAX) 100 MG tablet Take 1 tablet by mouth 3 times daily. 11/16/12  Yes Rosamaria Lints, MD   oxybutynin (DITROPAN) 5 MG tablet Take 1 tablet by mouth 2 times daily. 11/16/12  Yes Rosamaria Lints, MD   meloxicam (MOBIC) 15 MG tablet Take 1 tablet by mouth daily. 12/21/12   Rosamaria Lints, MD   ketorolac (TORADOL) 10 MG tablet Take 1 tablet by mouth every 6  hours as needed. 05/11/12   Rosamaria Lints, MD   oxybutynin (DITROPAN) 5 MG tablet Take 1 tablet by mouth 2 times daily. 05/11/12 11/04/12  Rosamaria Lints, MD       Allergies:    Comments:    Review of patient's allergies indicates no known allergies.---DEMEROL DUE TO POSSIBLE DRUG INTERACTION    PHYSICAL EXAMINATION-PATIENT IS WELL KNOWN TO ME AS A NEIGHBOR AND COLLEAGUE IN OUR ANESTHESIA DEPT. HE IS IN OBVIOUS PAIN BUT IS PLEASANT AND COOPERATIVE   Vitals:   Filed Vitals:    03/03/13 1332   BP: 104/74   Pulse: 77   Temp: 97.9 ??F (36.6 ??C)   Resp: 16     Mental Status: [x] Alert  & Oriented  [] Other:    Heart:   [x] Regular rate and rhythm  [] Other:    Lungs:  [x] Clear    [] Other:     Mallampati I Airway Classification:   [x] 1 [] 2 [] 3 [] 4  ASA Classification:  [] 1 [x] 2 [] 3 [] 4 [] 5  Remarks:      Medical Direction 1:  Chart reviewed and patient interviewed.     Medical Direction 2:  Plan discussed with CRNA.       REVIEW OF OBJECTIVE DATA  Lab Results   Component Value Date    HGB 14.1 01/29/2013    HCT 41.6 01/29/2013    PLT 171 01/29/2013    BUN 15 01/29/2013    BUN 24 02/26/2010    CREATININE 1.0 01/29/2013    CREATININE 1.1 02/26/2010    NA 141 01/29/2013    K 4.6 01/29/2013    CL 108 01/29/2013    GLUCOSE 89 01/29/2013    GLUCOSE 94 02/26/2010     EKG:  normal EKG, normal sinus rhythm  Other Pertinent Information:        TYPE OF ANESTHESIA PLANNED  [x] General [] Spinal [] MAC  [] Epidural  [] Other:      Alvira Philips. Hale Bogus, M.D.  Electronically signed 03/03/2013 at 2:59 PM

## 2013-03-03 NOTE — H&P (Signed)
[facility]  History and Physical Update    Pt Name: Bradley Oconnell  MRN: [id number]  Birthdate: October 08, 1955  Date of evaluation: 03/03/2013    [x]  I have examined the patient and reviewed the H&P/Consult and there are no changes to the patient or plans.    []  I have examined the patient and reviewed the H&P/Consult and have noted the following changes:        Saramarie Stinger M. Chimere Klingensmith DPM  Electronically signed 03/03/2013 at 7:28 AM

## 2013-03-04 NOTE — Anesthesia Post-Procedure Evaluation (Signed)
Anesthesia Post-op Note    [facility]  POST-ANESTHESIA NOTE       Name:  Bradley Oconnell                                         Age:  57 y.o.  MRN:  [id number]      Last Vitals:  BP 127/66   Pulse 75   Temp(Src) 97.7 ??F (36.5 ??C) (Temporal)   Resp 16   Ht 5\' 7"  (1.702 m)   Wt 189 lb 12.8 oz (86.093 kg)   BMI 29.72 kg/m2   SpO2 95%  No data found.      Level of Consciousness:  Awake    Respiratory:  Stable    Oxygen Saturation:  Stable    Cardiovascular:  Stable    Hydration:  Adequate    PONV:  Stable    Post-op Pain:  Adequate analgesia    Post-op Assessment:  No apparent anesthetic complications    Additional Follow-Up / Treatment / Comment:  None, left message for patient.     [name], CRNA  March 04, 2013   1:05 PM

## 2013-03-11 LAB — COMPREHENSIVE METABOLIC PANEL
ALT: 17 U/L (ref 11–66)
AST: 23 U/L (ref 5–40)
Albumin: 4.5 gm/dl (ref 3.5–5.1)
Alkaline Phosphatase: 70 U/L (ref 38–126)
BUN: 22 mg/dl (ref 7–22)
CO2: 23 meq/l (ref 23–33)
Calcium: 8.7 mg/dl (ref 8.5–10.5)
Chloride: 100 meq/l (ref 98–111)
Creatinine: 1.1 mg/dl (ref 0.4–1.2)
Glucose: 76 mg/dl (ref 70–108)
Potassium: 4.3 meq/l (ref 3.5–5.2)
Sodium: 134 meq/l — ABNORMAL LOW (ref 135–145)
Total Bilirubin: 0.5 mg/dl (ref 0.3–1.2)
Total Protein: 7.4 gm/dl (ref 6.1–8.0)

## 2013-03-11 LAB — MRSA BY PCR: MRSA SCREEN RT-PCR: POSITIVE — AB

## 2013-03-11 LAB — GLOMERULAR FILTRATION RATE, ESTIMATED: Est, Glom Filt Rate: 69 mL/min/{1.73_m2} — AB

## 2013-03-23 LAB — MRSA BY PCR: MRSA SCREEN RT-PCR: NEGATIVE

## 2013-03-25 NOTE — Telephone Encounter (Signed)
Pt called in stating he had surgery on his shoulder yesterday and they put him on aspirin 325 postop for 7 days for prevention of blood clot.  Patient stated he can either reschedule the colonoscopy or he stated he wouldn't take the aspirin.   Patient stated he would be off work until December.   It was decided to reschedule colonoscopy to 04/15/13 at 8:45 am.  Patient voiced understanding

## 2013-03-25 NOTE — Telephone Encounter (Signed)
Thanks

## 2013-03-26 NOTE — Patient Instructions (Signed)
Bring insurance info and drivers license  Wear comfortable clean clothing  Do not bring jewelry or valuables  Shower night before and morning of surgery with a liquid antibacterial soap  Bring medications in original bottles

## 2013-04-15 MED ADMIN — 0.45% NaCl infusion: INTRAVENOUS | @ 14:00:00 | NDC 00338004304

## 2013-04-15 NOTE — Progress Notes (Signed)
Pt is passing flatus,

## 2013-04-15 NOTE — Op Note (Signed)
[facility]                         RECORD OF OPERATION  PATIENT NAME: Bradley Oconnell  MEDICAL RECORD NO. [id number]  DATE: 04/15/2013  SURGEON: Farris Has. Allyah Heather, D.O. Jory Sims  PRIMARY CARE PHSYICIAN: Georges Lynch. Ringwald, MD     PROCEDURE PERFORMED:  04/15/2013  PREOPERATIVE DIAGNOSES: GERD, screening colonoscopy  POSTOPERATIVE DIAGNOSIS: mild gastritis, diverticulosis  PROCEDURE PERFORMED:  EGD and colonoscopy  SURGEON:  Dr. Farris Has. Kyriana Yankee.  ANESTHESIA:   150 mg Fentanyl and 10 mg Versed  ESTIMATED BLOOD LOSS:  None.  SPECIMENS:  None  COMPLICATIONS:  None immediately appreciated.     DISCUSSION:  Bradley Oconnell is a 57 y.o.-year-old male who presented to the office with symptoms of GERD and need for screening colonoscopy at the request of Windy Fast A. Ringwald, MD. After a history and physical examination was performed, potential diagnostic and therapeutic modalities were discussed with the patient.  Radiographic and endoscopic studies were discussed. The risks, complications and benefits were reviewed. He was given an opportunity to ask questions and once answered informed consent was obtained. He was brought to the Endoscopy Suite on 04/15/2013 for the procedure.     OPERATIVE FINDINGS:  At the time of endoscopy the esophageal contours within normal limits. No evidence of hiatal hernia.  Pt had no significant reflux esophagitis, gastric contours within normal limits.  There was mild nonspecific inflammation of the stomach mucosa. There was no evidence of bleeding or ulcerations present within the stomach, pylorus is widely patent and no evidence of duodenal ulcerations or bleeding were evident. At the time of colonoscopy good prep appreciated. Scope was able to be advanced to level of cecum. There was sigmoid diverticulosis without diverticulitis.      PROCEDURE:  The patient was brought to the Endoscopy Suite, placed in left lateral Sims position, placed under cardiac, telemetry, blood pressure and pulse  oximetry monitoring, placed under IV sedation with the medications noted above done in incremental fashion to achieve sedation.  The posterior pharynx was sprayed with Cetacaine spray and a bite block placed in the patient's mouth. Under direct visualization, the videogastroscope was inserted through the oropharynx, cannulating the cricopharyngeus.  The esophagus was then gently insufflated and the scope advanced under direct visualization to the GE junction.  Esophageal contours were within normal limits.  The distal esophagus showed minimal reflux changes.  The scope was advanced into the stomach, stomach gently insufflated.  Gastric contours were within normal limits.  Mucosa was mildly inflamed.  There was no evidence of ulceration or bleeding evident at this time.   A J maneuver performed in the stomach failed to reveal any fundic pathology or hiatal hernia. The pylorus was easily cannulated.  First and second portions of duodenum were well visualized without pathology.  The stomach and duodenum were then decompressed and the gastroscope was then removed.   He was then repositioned for colonoscopy. Digital rectal exam performed revealing adequate rectal tone, no masses palpable. Colonoscope advanced to rectum, rectum gently insufflated. Under direct visualization colonoscope was advanced entirety of colon to level of cecum, confirmed by ileocecal valve, triangle folds, appendiceal orifice. The scope was slowly retracted, intraluminal structures inspected. The scope was retracted through the cecum, ascending colon, transverse colon, descending colon, sigmoid colon and rectum which were without pathology with the exception of mild sigmoid diverticulosis.  The colonoscope was then removed after the colon was decompressed.  The patient tolerated procedure well without immediate complication evident. The patient was brought out of anesthesia, tolerated the procedure well without any immediate complication evident.      Postoperative findings were discussed with the patient's family. He will follow up colonoscopy in 7-10 years period of time for re-evaluation.      Electronically signed by Farris Has. Coraline Talwar, DO on 04/15/2013 at 10:49 AM

## 2013-04-15 NOTE — Discharge Instructions (Addendum)
ENDOSCOPY DISCHARGE INSTRUCTION SHEET  EGD/COLONOSCOPY  Bradley Oconnell, D.O. Bradley Oconnell  04/15/2013    1. Call your doctor at [phone] if any of the following symptoms occur at anytime   *Excessive pain - a few cramps are to be expected   *Temperature above 100 degrees    *Excessive bleeding or large clots of blood. Don't worry about a few specks of blood.   *Repeated nausea and vomiting.    2. Follow colonoscopy recommended in 7-10 years.      [address]  [place], [place] [address]  Electronically signed by Farris Has. Jamonta Goerner, DO on 04/15/2013 at 10:49 AM

## 2013-04-15 NOTE — Progress Notes (Signed)
Pt dnies any pain at this time.

## 2013-04-15 NOTE — Progress Notes (Signed)
Colonoscopy began.

## 2013-04-15 NOTE — Progress Notes (Signed)
Pt. Admitted to endoscopy.  Consents signed.

## 2013-04-15 NOTE — Progress Notes (Signed)
Pt is sitting up drinking fluids, no C/O N/V.

## 2013-04-15 NOTE — Progress Notes (Signed)
Colonoscopy complete. Patient tolerated well. Pictures taken.

## 2013-04-15 NOTE — Progress Notes (Signed)
Patient passing flatus.

## 2013-04-15 NOTE — Progress Notes (Signed)
Patient to recovery resting without complaints.

## 2013-04-15 NOTE — Progress Notes (Signed)
Dr. Shirlean Schlein in to speak with patient time out completed. IV sedation began per physician EGD began.

## 2013-04-15 NOTE — Progress Notes (Signed)
Discharge instructions given to pt & driver with verbal understanding.  Pt taken to car per wheelchair.

## 2013-04-15 NOTE — Progress Notes (Signed)
EGD complete. Patient tolerated well. Pictures taken. No Bx.

## 2013-04-15 NOTE — H&P (Signed)
[facility]  Sedation/Analgesia History & Physical  Dr. Farris Has. Alessandria Henken    Pt Name: Bradley Oconnell  MRN: [id number]  Date of Birth: 11/13/1955  Provider Performing Procedure: Farris Has. Isiaha Greenup D.O. Jory Sims  Primary Care Physician: Georges Lynch. Ringwald, MD  PRE-PROCEDURE   DNR-CCA/DNR-CC - No  Brief History/Pre-Procedure Diagnosis:GERD and screening colonoscopy   MEDICAL HISTORY       has a past medical history of Depression, recurrent (HCC); ED (erectile dysfunction); OA (osteoarthritis); Testosterone deficiency; Genital warts; and Cervical pain (neck).  SURGICAL HISTORY   has past surgical history that includes Foot surgery (Bilateral); hernia repair (2010); Hemorrhoid surgery (2006); Rotator cuff repair (Right); and Colonoscopy.  ALLERGIES   Allergies as of 03/26/2013   ??? (No Known Allergies)     MEDICATIONS   Coumadin Use Last 7 Days: No  Antiplatelet drug therapy use last 7 days: No  Other anticoagulant use last 7 days: No  Prior to Admission medications    Medication Sig Start Date End Date Taking? Authorizing Provider   oxybutynin (DITROPAN) 5 MG tablet Take 5 mg by mouth 2 times daily. 11/05/12 03/26/13 Yes Georges Lynch Ringwald, MD   finasteride (PROSCAR) 5 MG tablet Take 1 tablet by mouth daily. 12/21/12 12/21/13 Yes Georges Lynch Ringwald, MD   oxybutynin (DITROPAN) 5 MG tablet Take 1 tablet by mouth 2 times daily. 11/16/12  Yes Rosamaria Lints, MD   oxybutynin (DITROPAN) 5 MG tablet Take 1 tablet by mouth 2 times daily. 05/11/12 11/04/12  Rosamaria Lints, MD     PHYSICAL EXAMINATION   Vitals:  height is 5\' 7"  (1.702 m) and weight is 198 lb (89.812 kg). His tympanic temperature is 96 ??F (35.6 ??C). His blood pressure is 127/78 and his pulse is 72. His respiration is 20 and oxygen saturation is 96%.   Mental Status: alert, cooperative  Heart:  Heart regular rate and rhythm     Lungs:  Clear      Abdomen: Soft, nontender       PLANNED PROCEDURE   Colonoscopy & EGD   SEDATION   Planned agent for conscious  sedation: per anesthesia  ASA Classification: 2  Class 1: A normal healthy patient  Class 2: Pt with mild to moderate systemic disease  Class 3: Severe systemic disease or disturbance  Class 4: Severe systemic disorders that are already life threatening.  Class 5: Moribund pt with little chances of survival, for more than 24 hours.  Mallampati I Airway Classification: 2    1. Pre-procedure diagnostic studies complete and results available.   Comment:    2. Previous sedation/anesthesia experiences assessed.   Comment:    3. The patient is an appropriate candidate to undergo the planned procedure sedation and anesthesia. (Refer to nursing sedation/analgesia documentation record)  4. Formulation and discussion of sedation/procedure plan, risks, and expectations with patient and/or responsible adult completed.  5. Patient examined immediately prior to the procedure. (Refer to nursing sedation/analgesia documentation record)      Electronically signed by Farris Has. Tashica Provencio, DO on 04/15/2013 at 9:43 AM

## 2013-04-24 NOTE — Progress Notes (Signed)
This encounter was created in error - please disregard.

## 2013-05-10 ENCOUNTER — Encounter

## 2013-05-10 MED ORDER — FINASTERIDE 5 MG PO TABS
5 MG | ORAL_TABLET | Freq: Every day | ORAL | Status: DC
Start: 2013-05-10 — End: 2013-09-30

## 2013-05-10 MED ORDER — MELOXICAM 15 MG PO TABS
15 MG | ORAL_TABLET | Freq: Every day | ORAL | Status: DC
Start: 2013-05-10 — End: 2013-09-30

## 2013-05-10 NOTE — Telephone Encounter (Signed)
ep'ed requested meds to pharm requested

## 2013-05-10 NOTE — Telephone Encounter (Signed)
Patient is requesting refills of his Mobic and his Proscar 5 mg.  Would like to have them sent to [facility].  Call him back only if any problems

## 2013-09-30 MED ORDER — LORCASERIN HCL 10 MG PO TABS
10 MG | ORAL_TABLET | Freq: Two times a day (BID) | ORAL | Status: DC
Start: 2013-09-30 — End: 2013-10-04

## 2013-09-30 MED ORDER — TADALAFIL 5 MG PO TABS
5 MG | ORAL_TABLET | ORAL | Status: DC
Start: 2013-09-30 — End: 2014-02-14

## 2013-09-30 MED ORDER — FINASTERIDE 5 MG PO TABS
5 MG | ORAL_TABLET | Freq: Every day | ORAL | Status: DC
Start: 2013-09-30 — End: 2014-02-22

## 2013-09-30 MED ORDER — MELOXICAM 15 MG PO TABS
15 MG | ORAL_TABLET | Freq: Every day | ORAL | Status: DC
Start: 2013-09-30 — End: 2014-02-22

## 2013-09-30 MED ORDER — OXYBUTYNIN CHLORIDE 5 MG PO TABS
5 MG | ORAL_TABLET | Freq: Two times a day (BID) | ORAL | Status: DC
Start: 2013-09-30 — End: 2014-02-22

## 2013-09-30 NOTE — Patient Instructions (Signed)
DASH Diet: After Your Visit  Your Care Instructions  The DASH diet is an eating plan that can help lower your blood pressure. DASH stands for Dietary Approaches to Stop Hypertension. Hypertension is high blood pressure.  The DASH diet focuses on eating foods that are high in calcium, potassium, and magnesium. These nutrients can lower blood pressure. The foods that are highest in these nutrients are fruits, vegetables, low-fat dairy products, nuts, seeds, and legumes. But taking calcium, potassium, and magnesium supplements instead of eating foods that are high in those nutrients does not have the same effect. The DASH diet also includes whole grains, fish, and poultry.  The DASH diet is one of several lifestyle changes your doctor may recommend to lower your high blood pressure. Your doctor may also want you to decrease the amount of sodium in your diet. Lowering sodium while following the DASH diet can lower blood pressure even further than just the DASH diet alone.  Follow-up care is a key part of your treatment and safety. Be sure to make and go to all appointments, and call your doctor if you are having problems. It's also a good idea to know your test results and keep a list of the medicines you take.  How can you care for yourself at home?  Following the DASH diet  ?? Eat 4 to 5 servings of fruit each day. A serving is 1 medium-sized piece of fruit, ?? cup chopped or canned fruit, 1/4 cup dried fruit, or 4 ounces (?? cup) of fruit juice. Choose fruit more often than fruit juice.  ?? Eat 4 to 5 servings of vegetables each day. A serving is 1 cup of lettuce or raw leafy vegetables, ?? cup of chopped or cooked vegetables, or 4 ounces (?? cup) of vegetable juice. Choose vegetables more often than vegetable juice.  ?? Get 2 to 3 servings of low-fat and fat-free dairy each day. A serving is 8 ounces of milk, 1 cup of yogurt, or 1 ?? ounces of cheese.  ?? Eat 6 to 8 servings of grains each day. A serving is 1 slice of  bread, 1 ounce of dry cereal, or ?? cup of cooked rice, pasta, or cooked cereal. Try to choose whole-grain products as much as possible.  ?? Limit lean meat, poultry, and fish to 2 servings each day. A serving is 3 ounces, about the size of a deck of cards.  ?? Eat 4 to 5 servings of nuts, seeds, and legumes (cooked dried beans, lentils, and split peas) each week. A serving is 1/3 cup of nuts, 2 tablespoons of seeds, or ?? cup cooked dried beans or peas.  ?? Limit fats and oils to 2 to 3 servings each day. A serving is 1 teaspoon of vegetable oil or 2 tablespoons of salad dressing.  ?? Limit sweets and added sugars to 5 servings or less a week. A serving is 1 tablespoon jelly or jam, ?? cup sorbet, or 1 cup of lemonade.  ?? Eat less than 2,300 milligrams (mg) of sodium a day. If you have high blood pressure, diabetes, or chronic kidney disease, if you are African-American, or if you are older than age 50, try to limit the amount of sodium you eat to less than 1,500 mg a day.  Tips for success  ?? Start small. Do not try to make dramatic changes to your diet all at once. You might feel that you are missing out on your favorite foods and then be more   likely to not follow the plan. Make small changes, and stick with them. Once those changes become habit, add a few more changes.  ?? Try some of the following:  ?? Make it a goal to eat a fruit or vegetable at every meal and at snacks. This will make it easy to get the recommended amount of fruits and vegetables each day.  ?? Try yogurt topped with fruit and nuts for a snack or healthy dessert.  ?? Add lettuce, tomato, cucumber, and onion to sandwiches.  ?? Combine a ready-made pizza crust with low-fat mozzarella cheese and lots of vegetable toppings. Try using tomatoes, squash, spinach, broccoli, carrots, cauliflower, and onions.  ?? Have a variety of cut-up vegetables with a low-fat dip as an appetizer instead of chips and dip.  ?? Sprinkle sunflower seeds or chopped almonds over  salads. Or try adding chopped walnuts or almonds to cooked vegetables.  ?? Try some vegetarian meals using beans and peas. Add garbanzo or kidney beans to salads. Make burritos and tacos with mashed pinto beans or black beans.   Where can you learn more?   Go to [email or web address] and sign in to your MyChart account. Enter H967 in the Search Health Information box to learn more about ???DASH Diet: After Your Visit.???    If you do not have an account, please click on the ???Sign Up Now??? link.     ?? 2006-2015 [employer]. Care instructions adapted under license by [facility]. This care instruction is for use with your licensed healthcare professional. If you have questions about a medical condition or this instruction, always ask your healthcare professional. [employer], Inc[employer] disclaims any warranty or liability for your use of this information.  Content Version: 10.4.390249; Current as of: September 30, 2012

## 2013-09-30 NOTE — Progress Notes (Signed)
Subjective:      Patient ID: Bradley Oconnell is a 58 y.o. male.    HPI  Pt here for annual exam and med refills.  Reviewed BMI of 32.  Encouraged diet, exercise and weight loss.  Reviewed health maintenance.  Denies any current problems, but is interested in something for weight loss.  Has coupon vouchers.  Knows his insurance will not cover these meds.  Divorced, former smoker,pmh reviewed.  Review of Systems   Constitutional: Negative for fever, chills, fatigue and unexpected weight change.   HENT: Negative for congestion, ear pain, rhinorrhea and sore throat.    Eyes: Negative for pain and visual disturbance.   Respiratory: Negative for cough, chest tightness, shortness of breath and wheezing.    Cardiovascular: Negative for chest pain and palpitations.   Gastrointestinal: Negative for nausea, vomiting, abdominal pain, diarrhea, constipation and blood in stool.   Genitourinary: Negative for urgency, frequency, hematuria and difficulty urinating.   Musculoskeletal: Negative for myalgias, back pain, joint swelling and neck pain.   Skin: Negative for rash.   Neurological: Negative for dizziness and headaches.   Hematological: Negative for adenopathy. Does not bruise/bleed easily.   Psychiatric/Behavioral: Negative for behavioral problems and sleep disturbance. The patient is not nervous/anxious.        Objective:   Physical Exam   Constitutional: He is oriented to person, place, and time. He appears well-developed and well-nourished.   HENT:   Head: Normocephalic and atraumatic.   Right Ear: External ear normal.   Left Ear: External ear normal.   Nose: Nose normal.   Mouth/Throat: Oropharynx is clear and moist.   Eyes: EOM are normal. Pupils are equal, round, and reactive to light.   Neck: Neck supple. Carotid bruit is not present. No thyromegaly present.   Cardiovascular: Normal rate, regular rhythm and normal heart sounds.    Pulmonary/Chest: Breath sounds normal. He has no wheezes. He has no rales.   Abdominal:  Soft. Bowel sounds are normal. There is no tenderness. There is no rebound and no guarding.   Musculoskeletal: Normal range of motion. He exhibits no edema.   Lymphadenopathy:     He has no cervical adenopathy.   Neurological: He is alert and oriented to person, place, and time. He has normal reflexes. No cranial nerve deficit.   Skin: Skin is warm and dry. No rash noted.   Psychiatric: He has a normal mood and affect.   Nursing note and vitals reviewed.    Health Maintenance   Topic Date Due   ??? FLU VACCINE YEARLY (ADULT)  03/22/2014   ??? TETANUS VACCINE ADULT (11 YEARS AND UP)  05/11/2022   ??? COLON CANCER SCREENING COLONOSCOPY  03/30/2023        Assessment:      Obesity  ED  OA  Overactive bladder       Plan:      Refill meds  belviq rx given  Encouraged diet, exercise and weight loss.  Reviewed health maintenance  Dash diet    Bradley Oconnell received counseling on the following healthy behaviors: nutrition, exercise and medication adherence    Patient given educational materials on Nutrition and Exercise    I have instructed Bradley Oconnell to complete a self tracking handout on Weights and instructed them to bring it with them to his next appointment.     Discussed use, benefit, and side effects of prescribed medications.  Barriers to medication compliance addressed.  All patient questions answered.  Pt voiced understanding.

## 2013-09-30 NOTE — Progress Notes (Signed)
Have you seen any other physician or provider since your last visit no    Have you had any other diagnostic tests since your last visit? no    Have you changed or stopped any medications since your last visit including any over-the-counter medicines, vitamins, or herbal medicines? no     Are you taking all your prescribed medications? Yes    If NO, why?

## 2013-10-04 ENCOUNTER — Telehealth

## 2013-10-04 MED ORDER — PHENTERMINE-TOPIRAMATE 3.75-23 MG PO CP24
ORAL_CAPSULE | Freq: Every day | ORAL | Status: DC
Start: 2013-10-04 — End: 2014-02-14

## 2013-10-04 NOTE — Telephone Encounter (Signed)
Patient states that he didn't like the wt loss drug.  Says that it made him feel funny.  Would like to try the other one you mentioned, Qsymia.  Not sure if it can be called in or not, if so would like it sent to [facility] on [address], otherwise mail it to him.  Please call him back at [phone] and let him know

## 2013-10-04 NOTE — Telephone Encounter (Signed)
Phone in rx as requested.  (qsymia)  Cancel any belviq refills.  ( side effects)

## 2013-10-04 NOTE — Telephone Encounter (Signed)
rx for qsymia called to [facility] at [facility], patient notified.

## 2013-10-13 NOTE — Telephone Encounter (Signed)
Patient states that the Qysmia 3.75-23 mg is not working.  Says that Bradley Oconnell just gave him #14 to try.  Would like to try the 11.25 mg dose.  Can we call this in for him to [facility] on [address]?  Call him back at [phone] if any problems

## 2013-10-13 NOTE — Telephone Encounter (Signed)
Per HIPAA, left message for patient that he needs to take it the way its prescribed and after the 14 days then can do 7.5/46 for 3 months.  Notified to call if any further questions

## 2013-10-13 NOTE — Telephone Encounter (Signed)
That's the way it is prescribed, that dose for 14 days. Then can do rx for 7.5/46 for 3 months.

## 2014-02-14 MED ORDER — MIRABEGRON ER 25 MG PO TB24
25 MG | ORAL_TABLET | Freq: Every day | ORAL | Status: DC
Start: 2014-02-14 — End: 2014-06-09

## 2014-02-14 MED ORDER — TADALAFIL 5 MG PO TABS
5 MG | ORAL_TABLET | ORAL | Status: DC
Start: 2014-02-14 — End: 2014-07-19

## 2014-02-14 NOTE — Patient Instructions (Signed)
Well Visit, Men 50 to 65: After Your Visit  Your Care Instructions  Physical exams can help you stay healthy. Your doctor has checked your overall health and may have suggested ways to take good care of yourself. He or she also may have recommended tests. At home, you can help prevent illness with healthy eating, regular exercise, and other steps.  Follow-up care is a key part of your treatment and safety. Be sure to make and go to all appointments, and call your doctor if you are having problems. It's also a good idea to know your test results and keep a list of the medicines you take.  How can you care for yourself at home?  ?? Reach and stay at a healthy weight. This will lower your risk for many problems, such as obesity, diabetes, heart disease, and high blood pressure.  ?? Get at least 30 minutes of exercise on most days of the week. Walking is a good choice. You also may want to do other activities, such as running, swimming, cycling, or playing tennis or team sports.  ?? Do not smoke. Smoking can make health problems worse. If you need help quitting, talk to your doctor about stop-smoking programs and medicines. These can increase your chances of quitting for good.  ?? Always wear sunscreen on exposed skin. Make sure to use a broad-spectrum sunscreen that has a sun protection factor (SPF) of 30 or higher. Use it every day, even when it is cloudy.  ?? See a dentist one or two times a year for checkups and to have your teeth cleaned.  ?? Wear a seat belt in the car.  ?? Limit alcohol to 2 drinks a day. Too much alcohol can cause health problems.  Follow your doctor's advice about when to have certain tests. These tests can spot problems early.  ?? Cholesterol. Your doctor will tell you how often to have this done based on your overall health and other things that can increase your risk for heart attack and stroke.  ?? Blood pressure. You will likely have your blood pressure checked at any visit to your doctor. If you  are healthy and have a blood pressure below 120/80 mm Hg, have your blood pressure checked at least every 1 to 2 years. This can be done during a routine doctor visit. If you have slightly higher or high blood pressure, or are at risk for heart disease, your doctor will suggest more frequent tests.  ?? Prostate exam. Talk to your doctor about whether you should have a blood test (called a PSA test) for prostate cancer. Experts disagree on whether men should have this test. Some experts recommend that you discuss the benefits and risks of the test with your doctor.  ?? Diabetes. Ask your doctor whether you should have tests for diabetes.  ?? Vision. Some experts recommend that you have yearly exams for glaucoma and other age-related eye problems starting at age 50.  ?? Hearing. Tell your doctor if you notice any change in your hearing. You can have tests to find out how well you hear.  ?? Colon cancer. You should begin tests for colon cancer at age 50. You may have one of several tests. Your doctor will tell you how often to have tests based on your age and risk. Risks include whether you already had a precancerous polyp removed from your colon or whether your parent, brother, sister, or child has had colon cancer.  ?? Heart attack and stroke   risk. At least every 4 to 6 years, you should have your risk for heart attack and stroke assessed. Your doctor uses factors such as your age, blood pressure, cholesterol, and whether you smoke or have diabetes to show what your risk for a heart attack or stroke is over the next 10 years.  ?? Abdominal aortic aneurysm. Ask your doctor whether you should have a test to check for an aneurysm. You may need a test if you ever smoked or if your parent, brother, sister, or child has had an aneurysm.  When should you call for help?  Watch closely for changes in your health, and be sure to contact your doctor if you have any problems or symptoms that concern you.   Where can you learn more?    Go to [email or web address] and sign in to your MyChart account. Enter [phone] in the [place] box to learn more about ???Well Visit, Men 3 to 12: After Your Visit.???    If you do not have an account, please click on the ???Sign Up Now??? link.     ?? 2006-2015 Healthwise[employer]. Care instructions adapted under license by [facility]. This care instruction is for use with your licensed healthcare professional. If you have questions about a medical condition or this instruction, always ask your healthcare professional. [place] any warranty or liability for your use of this information.  Content Version: 10.5.422740; Current as of: September 10, 2013              DASH Diet: After Your Visit  Your Care Instructions  The DASH diet is an eating plan that can help lower your blood pressure. DASH stands for Dietary Approaches to Stop Hypertension. Hypertension is high blood pressure.  The DASH diet focuses on eating foods that are high in calcium, potassium, and magnesium. These nutrients can lower blood pressure. The foods that are highest in these nutrients are fruits, vegetables, low-fat dairy products, nuts, seeds, and legumes. But taking calcium, potassium, and magnesium supplements instead of eating foods that are high in those nutrients does not have the same effect. The DASH diet also includes whole grains, fish, and poultry.  The DASH diet is one of several lifestyle changes your doctor may recommend to lower your high blood pressure. Your doctor may also want you to decrease the amount of sodium in your diet. Lowering sodium while following the DASH diet can lower blood pressure even further than just the DASH diet alone.  Follow-up care is a key part of your treatment and safety. Be sure to make and go to all appointments, and call your doctor if you are having problems. It's also a good idea to know your test results and keep a list of the medicines  you take.  How can you care for yourself at home?  Following the DASH diet  ?? Eat 4 to 5 servings of fruit each day. A serving is 1 medium-sized piece of fruit, ?? cup chopped or canned fruit, 1/4 cup dried fruit, or 4 ounces (?? cup) of fruit juice. Choose fruit more often than fruit juice.  ?? Eat 4 to 5 servings of vegetables each day. A serving is 1 cup of lettuce or raw leafy vegetables, ?? cup of chopped or cooked vegetables, or 4 ounces (?? cup) of vegetable juice. Choose vegetables more often than vegetable juice.  ?? Get 2 to 3 servings of low-fat and fat-free dairy each day. A serving is 8 ounces of  milk, 1 cup of yogurt, or 1 ?? ounces of cheese.  ?? Eat 6 to 8 servings of grains each day. A serving is 1 slice of bread, 1 ounce of dry cereal, or ?? cup of cooked rice, pasta, or cooked cereal. Try to choose whole-grain products as much as possible.  ?? Limit lean meat, poultry, and fish to 2 servings each day. A serving is 3 ounces, about the size of a deck of cards.  ?? Eat 4 to 5 servings of nuts, seeds, and legumes (cooked dried beans, lentils, and split peas) each week. A serving is 1/3 cup of nuts, 2 tablespoons of seeds, or ?? cup of cooked beans or peas.  ?? Limit fats and oils to 2 to 3 servings each day. A serving is 1 teaspoon of vegetable oil or 2 tablespoons of salad dressing.  ?? Limit sweets and added sugars to 5 servings or less a week. A serving is 1 tablespoon jelly or jam, ?? cup sorbet, or 1 cup of lemonade.  ?? Eat less than 2,300 milligrams (mg) of sodium a day. If you have high blood pressure, diabetes, or chronic kidney disease, if you are African-American, or if you are older than age 80, try to limit the amount of sodium you eat to less than 1,500 mg a day.  Tips for success  ?? Start small. Do not try to make dramatic changes to your diet all at once. You might feel that you are missing out on your favorite foods and then be more likely to not follow the plan. Make small changes, and stick with  them. Once those changes become habit, add a few more changes.  ?? Try some of the following:  ?? Make it a goal to eat a fruit or vegetable at every meal and at snacks. This will make it easy to get the recommended amount of fruits and vegetables each day.  ?? Try yogurt topped with fruit and nuts for a snack or healthy dessert.  ?? Add lettuce, tomato, cucumber, and onion to sandwiches.  ?? Combine a ready-made pizza crust with low-fat mozzarella cheese and lots of vegetable toppings. Try using tomatoes, squash, spinach, broccoli, carrots, cauliflower, and onions.  ?? Have a variety of cut-up vegetables with a low-fat dip as an appetizer instead of chips and dip.  ?? Sprinkle sunflower seeds or chopped almonds over salads. Or try adding chopped walnuts or almonds to cooked vegetables.  ?? Try some vegetarian meals using beans and peas. Add garbanzo or kidney beans to salads. Make burritos and tacos with mashed pinto beans or black beans.   Where can you learn more?   Go to [email or web address] and sign in to your MyChart account. Enter [phone] in the [place] box to learn more about ???DASH Diet: After Your Visit.???    If you do not have an account, please click on the ???Sign Up Now??? link.     ?? 2006-2015 Healthwise, Inc[employer]. Care instructions adapted under license by [facility]. This care instruction is for use with your licensed healthcare professional. If you have questions about a medical condition or this instruction, always ask your healthcare professional. [place] any warranty or liability for your use of this information.  Content Version: 10.5.422740; Current as of: September 10, 2013

## 2014-02-14 NOTE — Progress Notes (Signed)
Have you seen any other physician or provider since your last visit yes - Dr Dulce Sellar    Have you had any other diagnostic tests since your last visit? yes - xrays    Have you changed or stopped any medications since your last visit including any over-the-counter medicines, vitamins, or herbal medicines? no     Are you taking all your prescribed medications? Yes    If NO, why?

## 2014-02-14 NOTE — Progress Notes (Signed)
Subjective:      Patient ID: Bradley Oconnell is a 58 y.o. male.    HPI  Pt here for annual wellness exam and med refills.  Reviewed BMI of 31.  Encouraged diet, exercise and weight loss.  Reviewed health maintenance, colonoscopy up to date.  Denies any current problems, is feeling well.  Partnered, former smoker, pmh reviewed.  Review of Systems   Constitutional: Negative for fever, chills, fatigue and unexpected weight change.   HENT: Negative for congestion, ear pain, rhinorrhea and sore throat.    Eyes: Negative for pain and visual disturbance.   Respiratory: Negative for cough, chest tightness, shortness of breath and wheezing.    Cardiovascular: Negative for chest pain and palpitations.   Gastrointestinal: Negative for nausea, vomiting, abdominal pain, diarrhea, constipation and blood in stool.   Genitourinary: Negative for urgency, frequency, hematuria and difficulty urinating.   Musculoskeletal: Negative for myalgias, back pain, joint swelling and neck pain.   Skin: Negative for rash.   Neurological: Negative for dizziness and headaches.   Hematological: Negative for adenopathy. Does not bruise/bleed easily.   Psychiatric/Behavioral: Negative for behavioral problems and sleep disturbance. The patient is not nervous/anxious.        Objective:   Physical Exam   Constitutional: He is oriented to person, place, and time. He appears well-developed and well-nourished.   HENT:   Head: Normocephalic and atraumatic.   Right Ear: External ear normal.   Left Ear: External ear normal.   Nose: Nose normal.   Mouth/Throat: Oropharynx is clear and moist.   Eyes: EOM are normal. Pupils are equal, round, and reactive to light.   Neck: Neck supple. Carotid bruit is not present. No thyromegaly present.   Cardiovascular: Normal rate, regular rhythm and normal heart sounds.    Pulmonary/Chest: Breath sounds normal. He has no wheezes. He has no rales.   Abdominal: Soft. Bowel sounds are normal. There is no tenderness. There is no  rebound and no guarding.   Musculoskeletal: Normal range of motion. He exhibits no edema.   Lymphadenopathy:     He has no cervical adenopathy.   Neurological: He is alert and oriented to person, place, and time. He has normal reflexes. No cranial nerve deficit.   Skin: Skin is warm and dry. No rash noted.   Psychiatric: He has a normal mood and affect.   Nursing note and vitals reviewed.    Health Maintenance   Topic Date Due   ??? FLU VACCINE YEARLY (ADULT)  03/22/2014   ??? TETANUS VACCINE ADULT (11 YEARS AND UP)  05/11/2022   ??? COLON CANCER SCREENING COLONOSCOPY  03/30/2023       Assessment:      58 year old well exam       Plan:      Refill meds  Hold on BW til surgery pre-op  Encouraged diet, exercise and weight loss.  Dash diet  Reviewed health maintenance

## 2014-02-22 ENCOUNTER — Telehealth

## 2014-02-22 MED ORDER — FINASTERIDE 5 MG PO TABS
5 MG | ORAL_TABLET | Freq: Every day | ORAL | Status: DC
Start: 2014-02-22 — End: 2015-03-02

## 2014-02-22 MED ORDER — MELOXICAM 15 MG PO TABS
15 MG | ORAL_TABLET | Freq: Every day | ORAL | Status: DC
Start: 2014-02-22 — End: 2014-06-22

## 2014-02-22 MED ORDER — OXYBUTYNIN CHLORIDE 5 MG PO TABS
5 MG | ORAL_TABLET | Freq: Two times a day (BID) | ORAL | Status: DC
Start: 2014-02-22 — End: 2014-10-04

## 2014-02-22 NOTE — Telephone Encounter (Signed)
ep'ed 3 meds to pharm requested

## 2014-02-22 NOTE — Telephone Encounter (Signed)
Patient called because he checked [facility] for his prescriptions but they aren't showing up.  I checked and they were sent to [facility] instead.  Can we send them to [facility] instead?  Call him back only if any problems

## 2014-02-25 ENCOUNTER — Telehealth

## 2014-02-25 MED ORDER — TRANYLCYPROMINE SULFATE 10 MG PO TABS
10 MG | ORAL_TABLET | Freq: Three times a day (TID) | ORAL | Status: DC
Start: 2014-02-25 — End: 2015-03-02

## 2014-02-25 MED ORDER — TOPIRAMATE 100 MG PO TABS
100 MG | ORAL_TABLET | Freq: Three times a day (TID) | ORAL | Status: DC
Start: 2014-02-25 — End: 2015-03-02

## 2014-02-25 NOTE — Telephone Encounter (Signed)
Pt requesting refills on topamax 100mg  - 1 po tid and Parnate 10mg  - 1 po tid.  Both need sent to [facility].  Rx pending to [facility].

## 2014-02-25 NOTE — Telephone Encounter (Signed)
ep'ed requested meds to pharm requested

## 2014-06-07 ENCOUNTER — Inpatient Hospital Stay: Attending: Foot & Ankle Surgery

## 2014-06-07 ENCOUNTER — Encounter

## 2014-06-07 ENCOUNTER — Ambulatory Visit: Admit: 2014-06-07

## 2014-06-07 DIAGNOSIS — Z01818 Encounter for other preprocedural examination: Secondary | ICD-10-CM

## 2014-06-07 LAB — COMPREHENSIVE METABOLIC PANEL
ALT: 23 U/L (ref 11–66)
AST: 28 U/L (ref 5–40)
Albumin: 4.4 gm/dl (ref 3.5–5.1)
Alkaline Phosphatase: 64 U/L (ref 38–126)
BUN: 19 mg/dl (ref 7–22)
CO2: 25 meq/l (ref 23–33)
Calcium: 9.3 mg/dl (ref 8.5–10.5)
Chloride: 106 meq/l (ref 98–111)
Creatinine: 0.8 mg/dl (ref 0.4–1.2)
Glucose: 96 mg/dl (ref 70–108)
Potassium: 4.4 meq/l (ref 3.5–5.2)
Sodium: 143 meq/l (ref 135–145)
Total Bilirubin: 0.4 mg/dl (ref 0.3–1.2)
Total Protein: 7 gm/dl (ref 6.1–8.0)

## 2014-06-07 LAB — CBC
Hematocrit: 44.1 % (ref 42.0–52.0)
Hemoglobin: 14.6 gm/dl (ref 14.0–18.0)
MCH: 31.2 pg — ABNORMAL HIGH (ref 27.0–31.0)
MCHC: 33.1 gm/dl (ref 33.0–37.0)
MCV: 94.3 fL — ABNORMAL HIGH (ref 80.0–94.0)
MPV: 8.4 mcm (ref 7.4–10.4)
Platelets: 189 10*3/uL (ref 130–400)
RBC: 4.68 10*6/uL — ABNORMAL LOW (ref 4.70–6.10)
RDW: 13.9 % (ref 11.5–14.5)
WBC: 6.7 10*3/uL (ref 4.8–10.8)

## 2014-06-07 LAB — ANION GAP: Anion Gap: 16.4 (ref 10.0–20.0)

## 2014-06-07 LAB — GLOMERULAR FILTRATION RATE, ESTIMATED: Est, Glom Filt Rate: >90 mL/min/{1.73_m2}

## 2014-06-08 LAB — EKG 12-LEAD
Atrial Rate: 57 {beats}/min
P Axis: -11 degrees
P-R Interval: 170 ms
Q-T Interval: 424 ms
QRS Duration: 90 ms
QTc Calculation (Bazett): 412 ms
R Axis: -11 degrees
T Axis: 18 degrees
Ventricular Rate: 57 {beats}/min

## 2014-06-09 ENCOUNTER — Ambulatory Visit: Admit: 2014-06-09 | Discharge: 2014-06-09 | Payer: PRIVATE HEALTH INSURANCE | Attending: Family Medicine

## 2014-06-09 DIAGNOSIS — Z01818 Encounter for other preprocedural examination: Secondary | ICD-10-CM

## 2014-06-09 NOTE — Progress Notes (Signed)
Pre op form faxed to [phone]

## 2014-06-09 NOTE — Progress Notes (Signed)
Have you seen any other physician or provider since your last visit yes - Dr Dulce Sellar, Dr Ilsa Iha    Have you had any other diagnostic tests since your last visit? yes     Have you changed or stopped any medications since your last visit including any over-the-counter medicines, vitamins, or herbal medicines? no     Are you taking all your prescribed medications? Yes    If NO, why?

## 2014-06-09 NOTE — Progress Notes (Signed)
Subjective:      Patient ID: Bradley Oconnell is a 58 y.o. male.    HPI  Pt here for pre-op physical at the request of Dr. Dulce Sellar and Ilsa Iha.  Scheduled to have right foot surgery for bunion and left inguinal hernia repair on 06/22/2014.  Reviewed BMi of 31.  Encouraged diet, exercise and weight loss.  Reviewed pre-op work up, blood work, Copy and cxr were ok.  Pt. Has a normal functional capacity.  No prior stress testing or heart caths.  No chest pain or SOB.  Married, former smoker,pmh reviewed.  Review of Systems   Constitutional: Negative for fever, chills, fatigue and unexpected weight change.   HENT: Negative for congestion, ear pain, rhinorrhea and sore throat.    Eyes: Negative for pain and visual disturbance.   Respiratory: Negative for cough, chest tightness, shortness of breath and wheezing.    Cardiovascular: Negative for chest pain and palpitations.   Gastrointestinal: Negative for nausea, vomiting, abdominal pain, diarrhea, constipation and blood in stool.        Left groin pain   Genitourinary: Negative for urgency, frequency, hematuria and difficulty urinating.   Musculoskeletal: Negative for myalgias, back pain, joint swelling and neck pain.        Right foot pain   Skin: Negative for rash.   Neurological: Negative for dizziness and headaches.   Hematological: Negative for adenopathy. Does not bruise/bleed easily.   Psychiatric/Behavioral: Negative for behavioral problems and sleep disturbance. The patient is not nervous/anxious.      Past Medical History   Diagnosis Date   ??? Depression, recurrent ([place])    ??? ED (erectile dysfunction)    ??? OA (osteoarthritis)    ??? Testosterone deficiency    ??? Genital warts    ??? Cervical pain (neck)      Past Surgical History   Procedure Laterality Date   ??? Foot surgery Bilateral      x 4   ??? Hernia repair  2010   ??? Hemorrhoid surgery  2006   ??? Rotator cuff repair Right    ??? Colonoscopy       polyps   ??? Upper gastrointestinal endoscopy  04/15/13     DR. Ilsa Iha NO BX.      No Known Allergies  Current outpatient prescriptions:topiramate (TOPAMAX) 100 MG tablet, Take 1 tablet by mouth 3 times daily, Disp: 270 tablet, Rfl: 3;  finasteride (PROSCAR) 5 MG tablet, Take 1 tablet by mouth daily, Disp: 90 tablet, Rfl: 3;  oxybutynin (DITROPAN) 5 MG tablet, Take 1 tablet by mouth 2 times daily, Disp: 180 tablet, Rfl: 3;  tranylcypromine (PARNATE) 10 MG tablet, Take 1 tablet by mouth 3 times daily, Disp: 270 tablet, Rfl: 3  meloxicam (MOBIC) 15 MG tablet, Take 1 tablet by mouth daily, Disp: 90 tablet, Rfl: 3;  tadalafil (CIALIS) 5 MG tablet, Take 1 tablet daily, Disp: 30 tablet, Rfl: 0;  [DISCONTINUED] oxybutynin (DITROPAN) 5 MG tablet, Take 1 tablet by mouth 2 times daily., Disp: 60 tablet, Rfl: 11  History     Social History   ??? Marital Status: Divorced     Spouse Name: N/A     Number of Children: N/A   ??? Years of Education: N/A     Occupational History   ??? Not on file.     Social History Main Topics   ??? Smoking status: Former Smoker   ??? Smokeless tobacco: Never Used   ??? Alcohol Use: Yes  Comment: social   ??? Drug Use: No   ??? Sexual Activity: Yes     Other Topics Concern   ??? Not on file     Social History Narrative     Family History   Problem Relation Age of Onset   ??? Parkinsonism Mother    ??? Arthritis Mother    ??? Heart Disease Father      Congestive Heart   ??? Cancer Father    ??? Stroke Father    ??? Arthritis Father    ??? Depression Father    ??? Asthma Neg Hx    ??? Birth Defects Neg Hx    ??? Diabetes Neg Hx    ??? Hearing Loss Neg Hx    ??? High Blood Pressure Neg Hx    ??? High Cholesterol Neg Hx    ??? Kidney Disease Neg Hx    ??? Learning Disabilities Neg Hx    ??? Mental Illness Neg Hx    ??? Mental Retardation Neg Hx    ??? Miscarriages / Stillbirths Neg Hx    ??? Substance Abuse Neg Hx    ??? Vision Loss Neg Hx    ??? Other Neg Hx    ??? Depression Son    ??? Early Death Son        Objective:   Physical Exam   Constitutional: He is oriented to person, place, and time. He appears well-developed and  well-nourished.   HENT:   Head: Normocephalic and atraumatic.   Right Ear: External ear normal.   Left Ear: External ear normal.   Nose: Nose normal.   Mouth/Throat: Oropharynx is clear and moist.   Eyes: EOM are normal. Pupils are equal, round, and reactive to light.   Neck: Neck supple. Carotid bruit is not present. No thyromegaly present.   Cardiovascular: Normal rate, regular rhythm and normal heart sounds.    Pulmonary/Chest: Breath sounds normal. He has no wheezes. He has no rales.   Abdominal: Soft. Bowel sounds are normal. There is no tenderness. There is no rebound and no guarding. A hernia is present. Hernia confirmed positive in the left inguinal area.   Genitourinary:         Musculoskeletal: Normal range of motion. He exhibits no edema.        Feet:    Lymphadenopathy:     He has no cervical adenopathy.   Neurological: He is alert and oriented to person, place, and time. He has normal reflexes. No cranial nerve deficit.   Skin: Skin is warm and dry. No rash noted.   Psychiatric: He has a normal mood and affect.   Nursing note and vitals reviewed.    Filed Vitals:    06/09/14 1546   BP: 118/80   Pulse: 84   Resp: 16   Height: 5' 6.25" (1.683 m)   Weight: 194 lb 3.2 oz (88.089 kg)     Assessment:      Pre-op physical  Right foot bunion  Left inguinal hernia       Plan:      After reviewing pre-op work up, he is cleared for proposed surgeries.  Note to [employer].    Electronically signed by Kipp Brood. Pleas Carneal, MD on 06/09/2014 at 4:05 PM

## 2014-06-09 NOTE — Patient Instructions (Signed)
Bunions: Care Instructions  Your Care Instructions     A bunion is a bump on the outside of the joint at the bottom of your big toe. It can cause pain and swelling in the toe. A bunion forms when bone or tissue around the joint becomes swollen from too much pressure. You also can have a bunionette, or tailor's bunion, which forms on the joint of the little toe. Sometimes, a bunion on the big toe turns the toe in toward the second toe. This is called displacement. It can lead to problems with the other toes.  You can get a bunion from having an unusual walking style, having flatfeet, or wearing tight-fitting shoes. You can treat most bunions at home with a few simple steps. If you have a lot of pain, your doctor may inject medicine into the bunion to reduce swelling for a while. If you still have pain, you may need to have surgery.  Follow-up care is a key part of your treatment and safety. Be sure to make and go to all appointments, and call your doctor if you are having problems. It's also a good idea to know your test results and keep a list of the medicines you take.  How can you care for yourself at home?  ?? Ask your doctor if you can take an over-the-counter pain medicine, such as acetaminophen (Tylenol), ibuprofen (Advil, Motrin), or naproxen (Aleve). Be safe with medicines. Read and follow all instructions on the label.  ?? Wear shoes that have a wide and deep space for the toes. Also, wear shoes that have low or flat heels and good arch supports. Do not wear tight, narrow, or high-heeled shoes.  ?? Try bunion pads, arch supports, or shoe inserts. They can help shift your weight when you walk to take pressure off your big toe.  ?? Put moleskin or another type of cushion on or around the bunion to keep it from rubbing against your shoe.  ?? Put ice or a cold pack on the area for 10 to 20 minutes at a time as needed. Put a thin cloth between the ice and your skin.  ?? Prop up your foot on a pillow when you ice  your toe or anytime you sit or lie down. Try to keep it above the level of your heart. This will help reduce swelling.  When should you call for help?  Call your doctor now or seek immediate medical care if:  ?? You have severe pain in your big toe that keeps you from walking or doing other activities.  ?? You have diabetes or peripheral arterial disease and the skin over a bunion is red, broken, or swollen. These diseases can reduce blood flow and feeling in your feet. This could make it easier for you to get an infection.  Watch closely for changes in your health, and be sure to contact your doctor if:  ?? Your big toe begins to overlap your second toe.  ?? Pain in your big toe does not get better after 2 to 3 weeks of care at home.   Where can you learn more?   Go to [email or web address] and sign in to your MyChart account. Enter H210 in the Search Health Information box to learn more about ???Bunions: Care Instructions.???    If you do not have an account, please click on the ???Sign Up Now??? link.     ?? 2006-2015 Healthwise, Incorporated. Care instructions adapted under license by [facility].   This care instruction is for use with your licensed healthcare professional. If you have questions about a medical condition or this instruction, always ask your healthcare professional. [place] any warranty or liability for your use of this information.  Content Version: 10.6.465758; Current as of: Dec 10, 2013              Hernia Repair: Before Your Surgery  What is hernia repair surgery?     A hernia occurs when a weak spot in your belly muscles allows a piece of your intestines or the tissue around them to poke through. This can cause a bulge in the area. It can also cause pain. But you may not feel anything.  The hernia may be in your groin. Or it may be near your belly button. In some cases, it's in a scar from an earlier surgery. A doctor can fix a hernia through a cut (incision)  made near it. This is called open surgery. Or the doctor may make some very small cuts and use a thin, lighted scope and small tools. This is laparoscopic surgery. If your hernia is bulging, the bulge is pushed back into place. The doctor then sews the healthy tissue back together. Often a piece of material is used to patch the weak spot.  Open surgery will leave a longer scar. Laparoscopic surgery leaves a few small scars. The scars will fade with time.  You may need to take 1 to 2 weeks off from work. This depends on the type of work you do and how you feel.  Follow-up care is a key part of your treatment and safety. Be sure to make and go to all appointments, and call your doctor if you are having problems. It's also a good idea to know your test results and keep a list of the medicines you take.  What happens before surgery?  Surgery can be stressful. This information will help you understand what you can expect. And it will help you safely prepare for surgery.  Preparing for surgery  ?? Understand exactly what surgery is planned, along with the risks, benefits, and other options.  ?? Tell your doctors ALL the medicines, vitamins, supplements, and herbal remedies you take. Some of these can increase the risk of bleeding or interact with anesthesia.  ?? If you take blood thinners, such as warfarin (Coumadin), clopidogrel (Plavix), or aspirin, be sure to talk to your doctor. He or she will tell you if you should stop taking these medicines before your surgery. Make sure that you understand exactly what your doctor wants you to do.  ?? Your doctor will tell you which medicines to take or stop before your surgery. You may need to stop taking certain medicines a week or more before surgery. So talk to your doctor as soon as you can.  ?? If you have an advance directive, let your doctor know. It may include a living will and a durable power of attorney for health care. Bring a copy to the hospital. If you don't have one,  you may want to prepare one. It lets your doctor and loved ones know your health care wishes. Doctors advise that everyone prepare these papers before any type of surgery or procedure.  What happens on the day of surgery?  ?? Follow the instructions exactly about when to stop eating and drinking. If you don't, your surgery may be canceled. If your doctor told you to take your medicines on the day of  surgery, take them with only a sip of water.  ?? Take a bath or shower before you come in for your surgery. Do not apply lotions, perfumes, deodorants, or nail polish.  ?? Do not shave the surgical site yourself.  ?? Take off all jewelry and piercings. And take out contact lenses, if you wear them.  At the hospital or surgery center  ?? Bring a picture ID.  ?? The area for surgery is often marked to make sure there are no errors.  ?? You will be kept comfortable and safe by your anesthesia provider. You will be asleep during the surgery.  ?? The surgery will take about 30 minutes to 2 hours. This depends on the size of the hernia and where it is.  Going home  ?? Be sure you have someone to drive you home. Anesthesia and pain medicine make it unsafe for you to drive.  ?? You will be given more specific instructions about recovering from your surgery. They will cover things like diet, wound care, follow-up care, driving, and getting back to your normal routine.  When should you call your doctor?  ?? You have questions or concerns.  ?? You don't understand how to prepare for your surgery.  ?? You become ill before the surgery (such as fever, flu, or a cold).  ?? You need to reschedule or have changed your mind about having the surgery.   Where can you learn more?   Go to [email or web address] and sign in to your MyChart account. Enter [phone] in the [place] box to learn more about ???Hernia Repair: Before Your Surgery.???    If you do not have an account, please click on the ???Sign Up Now??? link.     ??  2006-2015 Healthwise, Incorporated. Care instructions adapted under license by [facility]. This care instruction is for use with your licensed healthcare professional. If you have questions about a medical condition or this instruction, always ask your healthcare professional. [place] any warranty or liability for your use of this information.  Content Version: 10.6.465758; Current as of: June 04, 2013

## 2014-06-22 ENCOUNTER — Ambulatory Visit

## 2014-06-22 ENCOUNTER — Inpatient Hospital Stay: Admit: 2014-06-22 | Payer: PRIVATE HEALTH INSURANCE | Attending: Foot & Ankle Surgery

## 2014-06-22 ENCOUNTER — Encounter

## 2014-06-22 DIAGNOSIS — Z981 Arthrodesis status: Secondary | ICD-10-CM

## 2014-06-22 MED ORDER — BUPIVACAINE-EPINEPHRINE (PF) 0.5% -1:200000 IJ SOLN
INTRAMUSCULAR | Status: AC
Start: 2014-06-22 — End: ?

## 2014-06-22 MED ORDER — CEFAZOLIN SODIUM-DEXTROSE 2-3 GM-% IV SOLR
2-3 GM-% | INTRAVENOUS | Status: AC
Start: 2014-06-22 — End: 2014-06-22
  Administered 2014-06-22: 13:00:00 2 g via INTRAVENOUS

## 2014-06-22 MED ORDER — NEOSTIGMINE METHYLSULFATE 10 MG/10ML IV SOLN
10 MG/ML | INTRAVENOUS | Status: AC
Start: 2014-06-22 — End: ?

## 2014-06-22 MED ORDER — SODIUM CHLORIDE 0.9 % IV SOLN
0.9 % | INTRAVENOUS | Status: DC
Start: 2014-06-22 — End: 2014-06-22

## 2014-06-22 MED ORDER — HYDROMORPHONE HCL 2 MG/ML IJ SOLN
2 MG/ML | INTRAMUSCULAR | Status: DC | PRN
Start: 2014-06-22 — End: 2014-06-22

## 2014-06-22 MED ORDER — PHENYLEPHRINE HCL 10 MG/ML IJ SOLN
10 MG/ML | INTRAMUSCULAR | Status: AC
Start: 2014-06-22 — End: ?

## 2014-06-22 MED ORDER — FENTANYL CITRATE 0.05 MG/ML IJ SOLN
0.05 MG/ML | INTRAMUSCULAR | Status: AC
Start: 2014-06-22 — End: ?

## 2014-06-22 MED ORDER — PROPOFOL 10 MG/ML IV EMUL
10 MG/ML | INTRAVENOUS | Status: AC
Start: 2014-06-22 — End: ?

## 2014-06-22 MED ORDER — FENTANYL CITRATE 0.05 MG/ML IJ SOLN
0.05 MG/ML | INTRAMUSCULAR | Status: DC | PRN
Start: 2014-06-22 — End: 2014-06-22
  Administered 2014-06-22 (×2): 50 ug via INTRAVENOUS

## 2014-06-22 MED ORDER — ONDANSETRON HCL 4 MG/2ML IJ SOLN
4 MG/2ML | INTRAMUSCULAR | Status: AC
Start: 2014-06-22 — End: ?

## 2014-06-22 MED ORDER — FENTANYL CITRATE 0.05 MG/ML IJ SOLN
0.05 MG/ML | INTRAMUSCULAR | Status: DC | PRN
Start: 2014-06-22 — End: 2014-06-22

## 2014-06-22 MED ORDER — ROCURONIUM BROMIDE 50 MG/5ML IV SOLN
50 MG/5ML | INTRAVENOUS | Status: AC
Start: 2014-06-22 — End: ?

## 2014-06-22 MED ORDER — HYDROMORPHONE HCL 2 MG/ML IJ SOLN
2 MG/ML | INTRAMUSCULAR | Status: AC
Start: 2014-06-22 — End: ?

## 2014-06-22 MED ORDER — NORMAL SALINE FLUSH 0.9 % IV SOLN
0.9 % | INTRAVENOUS | Status: DC | PRN
Start: 2014-06-22 — End: 2014-06-22

## 2014-06-22 MED ORDER — LIDOCAINE HCL (CARDIAC) 20 MG/ML IV SOLN
20 MG/ML | INTRAVENOUS | Status: AC
Start: 2014-06-22 — End: ?

## 2014-06-22 MED ORDER — CEFAZOLIN SODIUM-DEXTROSE 2-3 GM-% IV SOLR
2-3 GM-% | INTRAVENOUS | Status: AC
Start: 2014-06-22 — End: ?

## 2014-06-22 MED ORDER — NORMAL SALINE FLUSH 0.9 % IV SOLN
0.9 % | Freq: Two times a day (BID) | INTRAVENOUS | Status: DC
Start: 2014-06-22 — End: 2014-06-22

## 2014-06-22 MED ORDER — MIDAZOLAM HCL 2 MG/2ML IJ SOLN
2 MG/ML | INTRAMUSCULAR | Status: AC
Start: 2014-06-22 — End: ?

## 2014-06-22 MED ORDER — SUCCINYLCHOLINE CHLORIDE 20 MG/ML IJ SOLN
20 MG/ML | INTRAMUSCULAR | Status: AC
Start: 2014-06-22 — End: ?

## 2014-06-22 MED ORDER — CEFAZOLIN SODIUM-DEXTROSE 2-3 GM-% IV SOLR
2-3 GM-% | INTRAVENOUS | Status: DC
Start: 2014-06-22 — End: 2014-06-22

## 2014-06-22 MED ORDER — OXYCODONE-ACETAMINOPHEN 5-325 MG PO TABS
5-325 MG | ORAL | Status: DC | PRN
Start: 2014-06-22 — End: 2014-06-22
  Administered 2014-06-22: 18:00:00 2 via ORAL

## 2014-06-22 MED ORDER — PROMETHAZINE HCL 25 MG/ML IJ SOLN
25 MG/ML | Freq: Once | INTRAMUSCULAR | Status: DC | PRN
Start: 2014-06-22 — End: 2014-06-22

## 2014-06-22 MED ORDER — DEXAMETHASONE SODIUM PHOSPHATE 20 MG/5ML IJ SOLN
20 MG/5ML | INTRAMUSCULAR | Status: AC
Start: 2014-06-22 — End: ?

## 2014-06-22 MED ORDER — BUPIVACAINE HCL (PF) 0.5 % IJ SOLN
0.5 % | INTRAMUSCULAR | Status: AC
Start: 2014-06-22 — End: ?

## 2014-06-22 MED ORDER — LABETALOL HCL 5 MG/ML IV SOLN
5 MG/ML | INTRAVENOUS | Status: DC | PRN
Start: 2014-06-22 — End: 2014-06-22

## 2014-06-22 MED ORDER — GLYCOPYRROLATE 0.2 MG/ML IJ SOLN
0.2 MG/ML | INTRAMUSCULAR | Status: AC
Start: 2014-06-22 — End: ?

## 2014-06-22 MED ORDER — ONDANSETRON HCL 4 MG/2ML IJ SOLN
4 MG/2ML | Freq: Four times a day (QID) | INTRAMUSCULAR | Status: DC | PRN
Start: 2014-06-22 — End: 2014-06-22

## 2014-06-22 MED FILL — DIPRIVAN 10 MG/ML IV EMUL: 10 MG/ML | INTRAVENOUS | Qty: 20

## 2014-06-22 MED FILL — HYDROMORPHONE HCL 2 MG/ML IJ SOLN: 2 MG/ML | INTRAMUSCULAR | Qty: 1

## 2014-06-22 MED FILL — SENSORCAINE-MPF/EPINEPHRINE 0.5% -1:200000 IJ SOLN: INTRAMUSCULAR | Qty: 30

## 2014-06-22 MED FILL — OXYCODONE-ACETAMINOPHEN 5-325 MG PO TABS: 5-325 MG | ORAL | Qty: 2

## 2014-06-22 MED FILL — ROCURONIUM BROMIDE 50 MG/5ML IV SOLN: 50 MG/5ML | INTRAVENOUS | Qty: 5

## 2014-06-22 MED FILL — FENTANYL CITRATE 0.05 MG/ML IJ SOLN: 0.05 MG/ML | INTRAMUSCULAR | Qty: 2

## 2014-06-22 MED FILL — PHENYLEPHRINE HCL 10 MG/ML IJ SOLN: 10 MG/ML | INTRAMUSCULAR | Qty: 1

## 2014-06-22 MED FILL — SENSORCAINE-MPF 0.5 % IJ SOLN: 0.5 % | INTRAMUSCULAR | Qty: 30

## 2014-06-22 MED FILL — ONDANSETRON HCL 4 MG/2ML IJ SOLN: 4 MG/2ML | INTRAMUSCULAR | Qty: 2

## 2014-06-22 MED FILL — NEOSTIGMINE METHYLSULFATE 10 MG/10ML IV SOLN: 10 MG/ML | INTRAVENOUS | Qty: 10

## 2014-06-22 MED FILL — DEXAMETHASONE SODIUM PHOSPHATE 20 MG/5ML IJ SOLN: 20 MG/5ML | INTRAMUSCULAR | Qty: 5

## 2014-06-22 MED FILL — CEFAZOLIN SODIUM-DEXTROSE 2-3 GM-% IV SOLR: 2-3 GM-% | INTRAVENOUS | Qty: 2

## 2014-06-22 MED FILL — GLYCOPYRROLATE 0.2 MG/ML IJ SOLN: 0.2 MG/ML | INTRAMUSCULAR | Qty: 4

## 2014-06-22 MED FILL — QUELICIN 20 MG/ML IJ SOLN: 20 MG/ML | INTRAMUSCULAR | Qty: 10

## 2014-06-22 MED FILL — MIDAZOLAM HCL 2 MG/2ML IJ SOLN: 2 MG/ML | INTRAMUSCULAR | Qty: 2

## 2014-06-22 MED FILL — LIDOCAINE HCL (CARDIAC) 20 MG/ML IV SOLN: 20 MG/ML | INTRAVENOUS | Qty: 5

## 2014-06-22 NOTE — Discharge Instructions (Addendum)
DR. Marvia Pickles DISCHARGE INSTRUCTIONS    Pt Name: Bradley Oconnell  Medical Record Number: [id number]  Today's Date: 06/22/2014  GENERAL ANESTHESIA OR SEDATION   1. Do not drive or operate hazardous machinery for 24 hours.  2. Do not make important business or personal decisions for 24 hours.  3. Do not drink alcoholic beverages or use tobacco for 24 hours.  ACTIVITY INSTRUCTIONS   1. Do not drive for 3-5 days and avoid heavy lifting, tugging, pullings greater than 10-20 lbs until seen in the office.    DIET INSTRUCTIONS   1. Regular diet as tolerated.  WOUND & DRESSING INSTRUCTIONS   Always ensure you and your care giver clean hands before and after caring for the wound.  1. Keep dressing clean and dry for 48 hours. Change when soiled or wet.      2. Allow steri-strips to fall off on their own.   3. Ice operative site for 20 minutes 4 times a day.     4. May wash over incision in shower in 48 hours, but do not soak in a bath.  ABDOMINAL & LAPAROSCOPIC PROCEDURES   1. You are encouraged to get up and move around as this helps with the circulation and speeds up the healing process.  2. Breath deeply and cough from time to time. This helps to clear your lungs and helps prevent pneumonia.  3. Supporting your incision with a pillow or your hand helps to minimize discomfort and pain.  4. Laparoscopic patients may develop shoulder pain in the first 48 hours from the gas used during the procedure.  FOLLOW UP CARE, SPECIFICALLY WATCH FOR:    Fever over 101 degrees by mouth   Increased redness, warmth, hardness at operative site.   Blood soaked dressing (small amounts of oozing may be normal.)   Increased or progressive drainage from the surgical area   Inability to urinate or blood in the urine   Pain not relieved by the medications ordered   Persistent nausea and/or vomiting, unable to retain fluids.    FOLLOW-UP APPOINTMENT in 2 weeks    Call my office if you have any problem that concerns you, [phone]. After hours, you  can reach the answering service via the office phone number. IF YOU NEED IMMEDIATE ATTENTION, GO TO THE EMERGENCY ROOM AND YOUR DOCTOR WILL BE CONTACTED.    [address]  [place] [address]  Electronically signed by [name]. Lanina Larranaga, DO on 06/22/2014 at 12:03 PM    Post Op Instructions    Pt Name: Bradley Oconnell  Medical Record Number: [id number]  Today's Date: 06/22/2014    GENERAL ANESTHESIA OR SEDATION  1. Do not drive or operate hazardous machinery for 24 hours.  2. Do not make important business or personal decisions for 24 hours.  3. Do not drink alcoholic beverages or use tobacco for 24 hours.    ACTIVITY INSTRUCTIONS:  []  Rest today. Resume light to normal activity tomorrow.   [x]  You may ambulate as tolerated in your post op shoe/boot.  []  No weight is to be placed on the operative limb.  Use crutches, walker, Roll-a-bout as needed.  [x] Elevate the operative limb as much as possible to reduce swelling and discomfort for the first 72 hours      DIET INSTRUCTIONS:  [] Begin with clear liquids. If not nauseated, may increase to a low-fat diet when you desire.   [x] Regular diet as tolerated.  [] Other:  MEDICATIONS  [x] Prescription sent with you to be used as directed.   Do not drink alcohol or drive while taking these medications. You may experience dizziness or drowsiness with these medications. You may also experience constipation which can be relieved with stool softners or laxatives.  [x] You may resume your daily prescription medication schedule unless otherwise specified.  [x] If taking 325mg  Aspirin or other blood thinners such as Coumadin or Plavix, you may resume tomorrow, unless told otherwise.     WOUND/DRESSING INSTRUCTIONS:  Always ensure you and your care giver clean hands before and after caring for the wound.  [x]  Keep dressing clean and dry until you are seen in the offfice      [x]  Ice operative site for 20 minutes 4 times a day.   - you may apply ice bag behind the knee or directly on the  foot/ankle bandage.   - do not apply ice directly to the skin  [x]  You may loosen the ACE wrap if it feels too tight, but do not disturb the underlying white gauze wrap.      FOLLOW-UP CARE. SPECIFICALLY WATCH FOR:   Fever over 101 degrees by mouth   Increased redness, warmth, hardness at operative site.   Blood soaked dressing (small amounts of oozing may be normal.)   Increased or progressive drainage from the surgical area   Inability to urinate or blood in the urine   Pain not relieved by the medications ordered   Persistent nausea and/or vomiting, unable to retain fluids.   Swelling, increased redness, warmth, or hardness around operative area   Numb, tingling or cold toes   Toe(s) become white or bluish    FOLLOW-UP APPOINTMENT   [] 1 week   [] 2 weeks   [x] Other, As prevously scheduled     Call my office if you have any problem that concerns you [phone]. After hours, you can reach the answering service via the office phone number. IF YOU NEED IMMEDIATE ATTENTION, GO TO THE EMERGENCY ROOM AND YOUR DOCTOR WILL BE CONTACTED.      Dr. Madalyn Rob, DPM    Michele Mcalpine, DPM PGY-III  Foot and Ankle Surgical Resident  06/22/2014  12:03 PM

## 2014-06-22 NOTE — H&P (Signed)
[facility]  History and Physical Update    Pt Name: Bradley Oconnell  MRN: [id number]  Birthdate: [birth year]  Date of evaluation: 06/22/2014    [x]  I have examined the patient and reviewed the H&P/Consult and there are no changes to the patient or plans.    []  I have examined the patient and reviewed the H&P/Consult and have noted the following changes:        Glenice Laine DPM  Electronically signed 06/22/2014 at 7:38 AM

## 2014-06-22 NOTE — H&P (Signed)
Bradley Cadet. Andreika Vandagriff, [facility]  Pt Name: Bradley Oconnell  Date of Birth August 02, 1955   Today's Date: 06/22/2014  Medical Record Number: [id number]  Referring Provider: Melanee Left, DO  Primary Care Provider: Kipp Brood. Ring[place], MDNo chief complaint on file.    ASSESSMENT     Left inguinal hernia    Past Medical History   Diagnosis Date   ??? Depression, recurrent ([place])    ??? ED (erectile dysfunction)    ??? OA (osteoarthritis)    ??? Testosterone deficiency    ??? Genital warts    ??? Cervical pain (neck)           PLANS      1. Schedule Bradley Oconnell for L/S left inguinal hernia repair with mesh  2. In the office, I had a discussion with the patient regarding the risks of hernia surgery to include bleeding, infection, recurrence, injury to surrounding structures, continued pain in the area and possible conversion to an open procedure . We also discussed the use of mesh and its advantages and disadvantages.  We discussed the anesthetic options, conduct of the operation, outpatient status, post op recovery and length of time off of work. The anticipated consequences of not treating this problem were also discussed. After this discussion, the patient's questions were answered and has elected to proceed with surgical repair.   3. Status: outpatient  4. Planned anesthesia: general  5. He will undergo pre-operative clearance per anesthesia guidelines with risk factors listed under the past medical history diagnosis & problem list.     SUBJECTIVE      Bradley Oconnell is a 58 y.o. male seen in the consultation for evaluation of a left inguinal hernia. Symptoms were first noted a few months ago and has gradually worsened since that time. The pain is dull, intermittent, radiating to testicle, mild in severity. It is aggravated by coughing, lifting and palliated by rest. He has no additional symptoms. He denies signs or symptoms of incarceration or strangulation. He has had prior right inguinal hernia surgery. This hernia is not stated to  be work related per patient.   Past Medical History  Past Medical History   Diagnosis Date   ??? Depression, recurrent ([place])    ??? ED (erectile dysfunction)    ??? OA (osteoarthritis)    ??? Testosterone deficiency    ??? Genital warts    ??? Cervical pain (neck)      Past Surgical History  Past Surgical History   Procedure Laterality Date   ??? Foot surgery Bilateral      x 4   ??? Hernia repair  2010   ??? Hemorrhoid surgery  2006   ??? Rotator cuff repair Right    ??? Colonoscopy       polyps   ??? Upper gastrointestinal endoscopy  04/15/13     DR. Ilsa Iha NO BX.     Medications  Current Outpatient Prescriptions   Medication Sig Dispense Refill   ??? tranylcypromine (PARNATE) 10 MG tablet Take 1 tablet by mouth 3 times daily 270 tablet 3   ??? topiramate (TOPAMAX) 100 MG tablet Take 1 tablet by mouth 3 times daily 270 tablet 3   ??? finasteride (PROSCAR) 5 MG tablet Take 1 tablet by mouth daily 90 tablet 3   ??? meloxicam (MOBIC) 15 MG tablet Take 1 tablet by mouth daily 90 tablet 3   ??? oxybutynin (DITROPAN) 5 MG tablet Take 1 tablet by mouth 2 times daily 180 tablet 3   ???  tadalafil (CIALIS) 5 MG tablet Take 1 tablet daily 30 tablet 0   ??? [DISCONTINUED] oxybutynin (DITROPAN) 5 MG tablet Take 1 tablet by mouth 2 times daily. 60 tablet 11     Current Facility-Administered Medications   Medication Dose Route Frequency Provider Last Rate Last Dose   ??? 0.9 % sodium chloride infusion   Intravenous Continuous Melanee Left, DO       ??? sodium chloride flush 0.9 % injection 10 mL  10 mL Intravenous 2 times per day [employer], DO       ??? sodium chloride flush 0.9 % injection 10 mL  10 mL Intravenous PRN Melanee Left, DO       ??? ceFAZolin (ANCEF) 2 g in dextrose 3% 77mL IVPB (duplex)  2 g Intravenous On Call to [place], DO         Allergies  has No Known Allergies.  Family History  family history includes Arthritis in his father and mother; Cancer in his father; Depression in his father and son; Early Death in his son; Heart Disease in his  father; Parkinsonism in his mother; Stroke in his father. There is no history of Asthma, Birth Defects, Diabetes, Hearing Loss, High Blood Pressure, High Cholesterol, Kidney Disease, Learning Disabilities, Mental Illness, Mental Retardation, Miscarriages / Stillbirths, Substance Abuse, Vision Loss, or Other.  Social History   reports that he has quit smoking. He has never used smokeless tobacco. He reports that he drinks alcohol. He reports that he does not use illicit drugs.  Health Screening Exams  Health Maintenance   Topic Date Due   ??? FLU VACCINE YEARLY (ADULT)  02/19/2014   ??? TETANUS VACCINE ADULT (11 YEARS AND UP)  05/11/2022   ??? COLON CANCER SCREENING COLONOSCOPY  03/30/2023     Review of Systems  History obtained from the patient.  Constitutional: Denies any fever, chills.  Derm: Denies any rash or skin color change.  Eyes: Denies blurred or decreased in vision.  Ent: Denies any tinnitus or vertigo.  Resp: Denies any cough or shortness of breath.  CV: Denies any syncope, palpitations or chest pain.  GI:  Denies any abdominal pain, nausea, vomiting, constipation or diarrhea.  GU: Denies any hematuria, hesitancy, or dysuria.  Heme/Lymph: Denies any bleeding.  Musculoskeletal: Denies any myalgias, muscle weakness or neck pain.  Neuro: Denies any dizziness, paresthesia or weakness.       OBJECTIVE    VITALS:  height is 5\' 7"  (1.702 m) and weight is 206 lb 9.6 oz (93.713 kg). His temporal temperature is 98.5 ??F (36.9 ??C). His blood pressure is 130/86 and his pulse is 77. His respiration is 18 and oxygen saturation is 98%.   CONSTITUTIONAL: Alert and oriented times 3, no acute distress and cooperative to examination with proper mood and affect.  SKIN: Skin color, texture, turgor normal. No rashes or lesions.  LYMPH: no cervical nodes, no inguinal nodes  HEENT: Head is normocephalic, atraumatic. EOMI, PERRLA.  NECK: Supple, symmetrical, trachea midline, no adenopathy, thyroid symmetric, not enlarged and no  tenderness, skin normal.  CHEST/LUNGS: chest symmetric with normal A/P diameter, normal respiratory rate and rhythm, lungs clear to auscultation without wheezes, rales or rhonchi. No accessory muscle use. Scars None   CARDIOVASCULAR: Heart sounds are normal.  Regular rate and rhythm without murmur, gallop or rub. Normal S1 and S2.. Carotid and femoral pulses 2+/4 and equal bilaterally.  ABDOMEN: Normal shape. right groin scar(s) present. Normal bowel sounds.  No bruits. soft,  nontender, nondistended, no masses or organomegaly. Left inguinal hernia present which is reducible. Percussion: Normal without hepatosplenomegally.  RECTAL: deferred, not clinically indicated  NEUROLOGIC: There are no focalizing motor or sensory deficits. CN II-XII are grossly intact.Marland Kitchen    EXTREMITIES: no cyanosis, no clubbing and no edema.      Thank you for the interesting evaluation. Further recommendations as listed above.       Electronically signed by Bradley Cadet. Yamaris Cummings, DO on 06/22/2014 at 6:54 AM

## 2014-06-22 NOTE — Op Note (Signed)
[facility]                                    [place], [place]                                   RECORD OF OPERATION     PATIENT NAME: Bradley Oconnell, Bradley Oconnell               DOB: 11/09/1955  MEDICAL RECORD NO. [id number]                 ROOM: SE  ACCOUNT NO: [id number]                          DATE: 06/22/2014  SURGEON: Madalyn Rob, D.P.M.        Coralee Rud, D.P.M. dictating for Madalyn Rob, D.P.M.     SURGEON:  Madalyn Rob, D.P.M.     ASSISTANT:  Coralee Rud, D.P.M., PGY-3.     PREOPERATIVE DIAGNOSES:    1. Right ankle synovitis.    2. Right severe hallux abductovalgus.     POSTOPERATIVE DIAGNOSES:    1. Right ankle synovitis.    2. Right severe hallux abductovalgus.     PROCEDURES:    1. Right ankle arthroscopy with synovectomy.    2. Right first metatarsophalangeal joint arthrodesis.     ANESTHESIA:  General.     HEMOSTASIS:  Well padded pneumatic thigh tourniquet set to 350 mmHg. for 120  minutes.     ESTIMATED BLOOD LOSS:  5 ml.     MATERIALS:    1. Asnis Stryker 4.0 mm screw.    2. Stryker [place] first metatarsophalangeal joint place with screws x5.     INJECTABLES:  40 ml of half percent Marcaine with epinephrine.     CONDITION:  Good.     COMPLICATIONS:  None.     SPECIMEN:  Not obtained.     FINDINGS:  None.     INDICATION:  Bradley Oconnell is a 58 year old male who is being treated for a  severe bunion deformity involving the right lower extremity as well as  chronic pain in the right ankle.  The patient is being treated by Dr. Dulce Sellar  and the patient has not responded with nonsurgical therapy.  At this time,  the patient elects to have surgical intervention to include fusion of the  right first metatarsophalangeal joint and arthroscopy of the right ankle.  The patient has been educated about the risks and benefits of surgery and  understands the possible postoperative risks to include:  Increased pain,  edema, hematoma, neuritis, recurrence, overcorrection,  malunion, nonunion,  delayed union, failure of hardware, infection, DVT, and/or the need for  further surgery or procedure.  No promises have been made as to the surgical  outcome.     PROCEDURE:  The patient was transported from the Preoperative Holding Area to  the Operating Room and placed in a supine position.  A right thigh tourniquet  was set.  The right foot was then prepped and draped in the normal aseptic  manner.  An Esmarch was used to exsanguinate the right foot and ankle and the  right thigh tourniquet inflated to 350 mmHg.     Attention was directed to the anterior aspect of the ankle joint  in  preparation for portal placement for ankle arthroscopy.  The tibialis  anterior was identified and an 18 gauge needle was used to locate the ankle  joint for the medial portal.  With identification of the ankle joint, 10 ml  of half percent Marcaine with epinephrine was administered.  A 15 scalpel  blade was used to make a small incision at the site of the medial portal and  a hemostat was used to spread the soft tissues.  A trocar and cannula were  then inserted in the medial portal of the right ankle joint.  The trocar was  removed and the scope was inserted into the medial portal.  With  identification of the talus and the tibia, a lateral portal was identified.  The 18 gauge needle again was used to identify placement for the lateral  portal. The portal was placed just lateral to the extensor digitorum longus,  being mindful of the lateral dorsal cutaneous branch of the superficial  peroneal nerve.  With adequate placement using an 18 gauge needle, a 15  scalpel blade was utilized to make a small incision and soft tissues were  spread using a hemostat.  The aggressive shaver was then inserted into the  lateral portal and a synovectomy of the right ankle was performed.  Upon  inspection, there were noted adhesions with synovitis in the medial and  lateral gutters, as well as along the anterior surface of the  tibia.  After  debridement of the ankle, an electrocautery set was used to remove any  remaining adhesions or hemorrhagic synovitis.  After fluoroscopy was  completed, the skin edges were reapproximated using 4-0 Prolene in a simple,  interrupted, and vertical mattress type fashion.     PROCEDURE #2:  Attention was directed to the right first metatarsophalangeal  joint where a full thickness linear incision of approximately 5 cm was made  down to the level of the subcutaneous tissues.  Any bleeding vessels were  cauterized using Bovie and care was taken to retract the neurovascular  bundle.  A full thickness linear incision was made down to the level of  capsule, and the first metatarsophalangeal joint capsule was reflected.  With  exposure of the first metatarsal head and proximal phalanx, a McGlamry  elevator was utilized to release the first metatarsophalangeal joint in it's  entirety.  A 0.062 K-wire was then inserted into the first metatarsal in  preparation for reaming.  A size 20 cm reamer was then used to remove all  articular cartilage from the head of the first metatarsal.  The guidepin was  then removed from the first metatarsal and placed into the proximal phalanx.  Placement of the guidepins for the first metatarsal and the proximal phalanx  were checked under fluoroscopy and found to be adequate.  The proximal  phalanx was then reamed using a 20 cm reamer.  With adequate resection of the  articular cartilage, the site was then drilled using a 2.0 mm drill bit.  The  toe was then reduced in preparation for fusion of the first  metatarsophalangeal joint.  The Stryker Anchorage first metatarsophalangeal  joint place was then placed dorsally.  The toe was positioned and loaded  using a surgical set lid and placement was found to be adequate.  The three  distal holes of the [place] plate were drilled, measured, and three 2.7 mm  screws were inserted distally.  With the toe remaining reduced, the  oblong  eccentric hole was  then drilled to add compression.  A locking screw was then  placed in the eccentric compression hole.  The remaining two screw holes on  the proximal aspect of the plate at the first metatarsophalangeal joint were  drilled, measured, and two additional screws were placed.  With adequate  reduction and compression across the first metatarsophalangeal joint, an  additional screw was placed.  A guidepin for a 4.0 mm Stryker Asnis screw was  placed from distal lateral dorsal to proximal medial dorsal.  Placement of  the screw was checked under fluoroscopy and found to be adequate.  A 4.0 mm  screw was then inserted across the fusion site of the first  metatarsophalangeal joint.  All screw lengths were checked under fluoroscopy  and found to be adequate. The foot was then loaded a final time.  Placement  of the hallux toe was found to be adequate.  The site was then flushed with  copious amounts of sterile saline.  The capsule was closed using 3-0 Vicryl  in a simple interrupted fashion.  The skin edges were reapproximated using  4-0 Prolene in a simple interrupted and vertical mattress type fashion.  30  ml of half percent Marcaine with epinephrine was administered to the right  foot.  The foot was then dressed using Adaptic, 4x4 gauze, Kerlix,  stockinette, and an ace bandage.  The patient was placed in a postoperative  shoe.     The patient was transported from the Operating Room to the PACU with vital  signs stable and neurovascular status intact.  The patient tolerated the  procedure well.  The patient will follow up with Dr. Dulce Sellar in the office  next week and will be allowed to remain weightbearing as tolerated in the  reverse Morton's cut out in his CAM walker.              Coralee Rud, D.P.M.        D: 06/22/2014 16:54                                    T: 06/22/2014 17:51  cd     CC:  Madalyn Rob, D.P.M.  [facility]  [address] [place] [address]      Coralee Rud, D.P.M.

## 2014-06-22 NOTE — Progress Notes (Signed)
9381 MRSA swabs obtained. Patient states he hasn't been on an antibiotic.

## 2014-06-22 NOTE — Anesthesia Pre-Procedure Evaluation (Signed)
Department of Anesthesiology  Preprocedure Note       Name:  Bradley Oconnell   Age:  58 y.o.  DOB:  30-May-1956                                          MRN:  [id number]         Date:  06/22/2014      Surgeon:    Procedure:    Medications prior to admission:   Prior to Admission medications    Medication Sig Start Date End Date Taking? Authorizing Provider   tranylcypromine (PARNATE) 10 MG tablet Take 1 tablet by mouth 3 times daily 02/25/14   Sheila Oats, MD   topiramate (TOPAMAX) 100 MG tablet Take 1 tablet by mouth 3 times daily 02/25/14   Sheila Oats, MD   finasteride (PROSCAR) 5 MG tablet Take 1 tablet by mouth daily 02/22/14 02/22/15  Sheila Oats, MD   meloxicam (MOBIC) 15 MG tablet Take 1 tablet by mouth daily 02/22/14   Sheila Oats, MD   oxybutynin (DITROPAN) 5 MG tablet Take 1 tablet by mouth 2 times daily 02/22/14   Sheila Oats, MD   tadalafil (CIALIS) 5 MG tablet Take 1 tablet daily 02/14/14   Sheila Oats, MD   oxybutynin (DITROPAN) 5 MG tablet Take 1 tablet by mouth 2 times daily. 05/11/12 11/04/12  Sheila Oats, MD       Current medications:    Current Outpatient Prescriptions   Medication Sig Dispense Refill   ??? tranylcypromine (PARNATE) 10 MG tablet Take 1 tablet by mouth 3 times daily 270 tablet 3   ??? topiramate (TOPAMAX) 100 MG tablet Take 1 tablet by mouth 3 times daily 270 tablet 3   ??? finasteride (PROSCAR) 5 MG tablet Take 1 tablet by mouth daily 90 tablet 3   ??? meloxicam (MOBIC) 15 MG tablet Take 1 tablet by mouth daily 90 tablet 3   ??? oxybutynin (DITROPAN) 5 MG tablet Take 1 tablet by mouth 2 times daily 180 tablet 3   ??? tadalafil (CIALIS) 5 MG tablet Take 1 tablet daily 30 tablet 0   ??? [DISCONTINUED] oxybutynin (DITROPAN) 5 MG tablet Take 1 tablet by mouth 2 times daily. 60 tablet 11     Current Facility-Administered Medications   Medication Dose Route Frequency Provider Last Rate Last Dose   ??? 0.9 % sodium chloride infusion   Intravenous Continuous Melanee Left, DO        ??? sodium chloride flush 0.9 % injection 10 mL  10 mL Intravenous 2 times per day [employer], DO       ??? sodium chloride flush 0.9 % injection 10 mL  10 mL Intravenous PRN Melanee Left, DO       ??? ceFAZolin (ANCEF) 2 g in dextrose 3% 52mL IVPB (duplex)  2 g Intravenous On Call to [place], DO           Allergies:  No Known Allergies    Problem List:    Patient Active Problem List   Diagnosis Code   ??? ED (erectile dysfunction) N52.9   ??? Testosterone deficiency E29.1   ??? OA (osteoarthritis) M19.90   ??? Overactive bladder N32.81   ??? Chronic depression F32.9   ??? Obesity (BMI 30.0-34.9) E66.9       Past Medical History:  Diagnosis Date   ??? Depression, recurrent (Post)    ??? ED (erectile dysfunction)    ??? OA (osteoarthritis)    ??? Testosterone deficiency    ??? Genital warts    ??? Cervical pain (neck)        Past Surgical History:        Procedure Laterality Date   ??? Foot surgery Bilateral      x 4   ??? Hernia repair  2010   ??? Hemorrhoid surgery  2006   ??? Rotator cuff repair Right    ??? Colonoscopy       polyps   ??? Upper gastrointestinal endoscopy  04/15/13     DR. Ilsa Iha NO BX.       Social History:    History   Substance Use Topics   ??? Smoking status: Former Smoker     Quit date: 06/23/2011   ??? Smokeless tobacco: Never Used   ??? Alcohol Use: Yes      Comment: social                                Counseling given: Not Answered      Vital Signs (Current):   Filed Vitals:    06/22/14 0651   BP: 130/86   Pulse: 77   Temp: 98.5 ??F (36.9 ??C)   TempSrc: Temporal   Resp: 18   Height: 5\' 7"  (1.702 m)   Weight: 206 lb 9.6 oz (93.713 kg)   SpO2: 98%                                              BP Readings from Last 3 Encounters:   06/22/14 130/86   06/09/14 118/80   02/14/14 102/68       NPO Status: Time of last liquid consumption: 2200                        Time of last solid consumption: 2200                        Date of last liquid consumption: 06/21/14                        Date of last solid food consumption:  06/21/14    BMI:   Wt Readings from Last 3 Encounters:   06/22/14 206 lb 9.6 oz (93.713 kg)   06/09/14 194 lb 3.2 oz (88.089 kg)   02/14/14 195 lb 12.8 oz (88.814 kg)     Body mass index is 32.35 kg/(m^2).    Anesthesia Evaluation  Patient summary reviewed and Nursing notes reviewed no history of anesthetic complications:   Airway: Mallampati: II  TM distance: >3 FB   Neck ROM: full  Mouth opening: > = 3 FB Dental:          Pulmonary: breath sounds clear to auscultation         Cardiovascular:  Exercise tolerance: good (>4 METS),           Rhythm: regular  Rate: normal                 Neuro/Psych:   (+) neuromuscular disease:, psychiatric history:   GI/Hepatic/Renal:  Endo/Other:          Abdominal:                    Anesthesia Plan    ASA 2     general     intravenous induction   Anesthetic plan and risks discussed with patient.    Plan discussed with CRNA.            W[place], DO   06/22/2014

## 2014-06-22 NOTE — Brief Op Note (Signed)
Brief Postoperative Note   06/22/2014  Bradley Oconnell  58 y.o.    Surgeon: Madalyn Rob, DPM    Assistants: Michele Mcalpine, DPM PGY-III    Pre-operative Diagnosis:    1. Right ankle synovitis   2. Right severe hallux abducto valgus    Post-operative Diagnosis:    Same    Procedure:    1. Right ankle arthroscopy with synovectomy   2. Right 1st MPJ arthrodesis    Anesthesia: General    Hemostasis: A well padded pneumatic thigh tourniquet at 353mmHg 120 minutes    Estimated Blood Loss: 5cc    Materials: Asnis Stryker 4.86mm screw; Stryker [place]orage 1st MPJ plate with screws x5    Injectables: 40cc of 0.5% Marcaine with epinephrine    Condition: Good    Complications: none     Specimens: were not obtained    Findings: None    Michele Mcalpine, DPM PGY-III  Podiatric Surgical Resident  06/22/2014   10:36

## 2014-06-22 NOTE — Op Note (Signed)
[place]  RECORD OF OPERATION  PATIENT NAME: Bradley Oconnell  MEDICAL RECORD NO. [id number]  SURGEON: [name]. Kenon Delashmit, D.O. Irven Shelling  PRIMARY CARE PHYSICIAN: Kipp Brood. Ringwald, MD     PROCEDURE PERFORMED:  06/22/2014  PREOPERATIVE DIAGNOSIS:  left inguinal hernia.  POSTOPERATIVE DIAGNOSES:  indirect left inguinal hernia.  PROCEDURE PERFORMED:  Laparoscopic left inguinal hernia repair with mesh.  SURGEON:  Dr. [name]. Elody Kleinsasser  ANESTHESIA: General with local.  ESTIMATED BLOOD LOSS:   5 ml.  SPECIMENS:  None.  COMPLICATIONS:  None immediately appreciated.     DISCUSSION:  [name] is a 58 y.o. year old male who presented to my office, seen in evaluation at the request of Jori Moll A. Ringwald, MD regarding a left inguinal hernia. After history physical examination was performed, potential diagnostic and therapeutic modalities discussed with the patient, operative and non operative management discussed. Laparoscopic, open procedure, use of mesh reviewed. He was given an opportunity to ask questions, once answered informed consent was obtained. The patient brought to the operating room on 06/22/2014 for the procedure.     OPERATIVE FINDINGS: At the time of exploration, the patient was found to have a indirect left inguinal hernia which was repaired as described below.     PROCEDURE:  The patient was brought to the operating room, placed in a supine position, placed under continuous cardiac telemetry, blood pressure, pulse oximetry monitoring and placed under general anesthesia by the Anesthesia Department. The anterior abdominal wall was prepped and draped in a sterile fashion. A 1 cm incision made in the periumbilical region and a 10 mm Optiview port inserted into the abdomen under direct visualization. Pneumoperitoneum created to a level of 17 mmHg. Visual inspection of the abdomen was carried out, please see operative findings above for specifics. Two additional 5 mm ports place in the lateral abdominal wall  under direct visualization with no injury to underlying viscera. Attention was then taken to the hernia site. The peritoneum was scored with electrocautery at the level of the internal ring. Preperitoneal dissection carried out using a combination of blunt and sharp dissection Exposing the posterior pubic bone, iliac vessels and cord structures. The hernia sac was reduced towards the abdomen. A Bard 3D mesh inserted through the 10 mm port site and direct into the groin. It was secured in place using a tacker and V-lock suture to the pubic bone and anterior abdominal wall. Good approximation and adequate hemostasis was appreciated. The peritoneum close with V-clock suture. Needles were retrieved. All ports and instruments were removed from the patients abdomen and pneumoperitoneum was reduced. The fascia defect of the 10 mm port site closed using 2-0 Vicryl sutures. Skin closed using 4-0 Monocryl suture in running subcuticular fashion.  The wounds were then cleansed.  Mastisol, Steri-Strips, sterile dressings were applied.  Patient brought out of anesthesia, transferred to PACU in stable and satisfactory condition.  No immediate  complication evident.  All sponge, instrument and needle counts were correct at completion of the procedure.     Postoperative findings discussed with the patient's family. He was given discharge instructions, prescriptions for analgesics and will follow up in my office in a two week period of time for reevaluation.      Electronically signed by [name]. Emerson Schreifels, DO on 06/22/2014 at 12:28 PM

## 2014-06-22 NOTE — Anesthesia Post-Procedure Evaluation (Signed)
Anesthesia Post-op Note    Patient: BENSEN CHADDERDON  MRN: [id number]  Birthdate: 01/16/1956  Date of evaluation: 06/22/2014  Time:  1:28 PM     Procedure(s) Performed:     Last Vitals: BP 140/76 mmHg   Pulse 93   Temp(Src) 98.4 ??F (36.9 ??C) (Temporal)   Resp 15   Ht 5\' 7"  (1.702 m)   Wt 206 lb 9.6 oz (93.713 kg)   BMI 32.35 kg/m2   SpO2 93%    Aldrete Phase I: Aldrete Score: 8    Aldrete Phase II: Aldrete Score: 10    Anesthesia Post Evaluation    Final anesthesia type: general  Patient location during evaluation: PACU  Patient participation: complete - patient participated  Level of consciousness: awake and alert  Airway patency: patent  Nausea & Vomiting: no nausea and no vomiting  Complications: no  Cardiovascular status: hemodynamically stable  Respiratory status: acceptable  Hydration status: euvolemic        Nihaal Friesen, DO  1:28 PM

## 2014-06-23 LAB — CULTURE, MRSA, SCREENING: Organism: POSITIVE — AB

## 2014-06-23 NOTE — Telephone Encounter (Signed)
Called to check on patient post op. Patient states he is doing very well, minimal pain, has only taken 4 norco since surgery. Did have some leg pain on lateral aspect of left side which is gone now. I did encourage pt to call Dr. Dulce Sellar if this continues. Pt voices understanding. Patient would like to speak to you if found anything abnormal. If nothing abnormal you do not need to call.

## 2014-06-24 LAB — CULTURE, MRSA, SCREENING

## 2014-06-30 NOTE — Telephone Encounter (Signed)
Spoke to patient and he said Dr. Dulce Sellar is not requiring a pre op visit.  appt cancelled with [facility].

## 2014-06-30 NOTE — Telephone Encounter (Signed)
I wouldn't require that, is [place]??  He should be fine, nothing has changed in less than a month.

## 2014-06-30 NOTE — Telephone Encounter (Signed)
Patient just had foot surgery on 06/22/14 and Dr. Dulce Sellar is now going to do surgery on the other toe on 07/13/14.  He was told by someone that he would need a pre op appt to be cleared but he just had a pre op visit on 06/09/14.  Does he need another appt to be cleared for this appt since it has been less than 30 days?

## 2014-07-05 ENCOUNTER — Encounter: Attending: Family Medicine

## 2014-07-12 ENCOUNTER — Telehealth

## 2014-07-12 MED ORDER — MUPIROCIN 2 % EX OINT
2 % | CUTANEOUS | Status: AC
Start: 2014-07-12 — End: 2014-07-19

## 2014-07-12 MED ORDER — SULFAMETHOXAZOLE-TRIMETHOPRIM 800-160 MG PO TABS
800-160 MG | ORAL_TABLET | Freq: Two times a day (BID) | ORAL | Status: AC
Start: 2014-07-12 — End: 2014-07-22

## 2014-07-12 NOTE — Telephone Encounter (Signed)
Patient is scheduled for foot surgery after christmas and his pre op labs came back that he is positive for MRSA.  States Dr Dulce Sellar is out of the office and he is asking if you could call in an antibiotic to treat this.  States he has no active wounds.  Uses [facility] on [address].  Call him back at [phone]

## 2014-07-12 NOTE — Telephone Encounter (Signed)
Pt notified that [facility] sent bactrim orally to the pharmacy along with bactroban ointment to nares.

## 2014-07-12 NOTE — Patient Instructions (Signed)
Bring insurance info and drivers license  Wear comfortable clean clothing  Do not bring jewelry or valuables  Shower night before and morning of surgery with a liquid antibacterial soap  Bring list of medications with dosage and how often taken  NPO after midnight  Bring a driver

## 2014-07-12 NOTE — Telephone Encounter (Signed)
ep'ed bactrim orally and bactroban to nares also.

## 2014-07-19 ENCOUNTER — Encounter

## 2014-07-19 ENCOUNTER — Ambulatory Visit: Admit: 2014-07-20

## 2014-07-19 ENCOUNTER — Inpatient Hospital Stay: Admit: 2014-07-19 | Attending: Foot & Ankle Surgery

## 2014-07-19 DIAGNOSIS — R52 Pain, unspecified: Secondary | ICD-10-CM

## 2014-07-19 MED ORDER — ONDANSETRON HCL 4 MG/2ML IJ SOLN
4 MG/2ML | INTRAMUSCULAR | Status: AC
Start: 2014-07-19 — End: ?

## 2014-07-19 MED ORDER — MIDAZOLAM HCL 2 MG/2ML IJ SOLN
2 MG/ML | INTRAMUSCULAR | Status: AC
Start: 2014-07-19 — End: ?

## 2014-07-19 MED ORDER — NORMAL SALINE FLUSH 0.9 % IV SOLN
0.9 % | INTRAVENOUS | Status: DC | PRN
Start: 2014-07-19 — End: 2014-07-20

## 2014-07-19 MED ORDER — FENTANYL CITRATE 0.05 MG/ML IJ SOLN
0.05 MG/ML | INTRAMUSCULAR | Status: AC
Start: 2014-07-19 — End: ?

## 2014-07-19 MED ORDER — ONDANSETRON HCL 4 MG/2ML IJ SOLN
4 MG/2ML | Freq: Once | INTRAMUSCULAR | Status: AC | PRN
Start: 2014-07-19 — End: 2014-07-19

## 2014-07-19 MED ORDER — BUPIVACAINE-EPINEPHRINE (PF) 0.5% -1:200000 IJ SOLN
INTRAMUSCULAR | Status: AC
Start: 2014-07-19 — End: ?

## 2014-07-19 MED ORDER — FENTANYL CITRATE 0.05 MG/ML IJ SOLN
0.05 MG/ML | INTRAMUSCULAR | Status: DC | PRN
Start: 2014-07-19 — End: 2014-07-20

## 2014-07-19 MED ORDER — NORMAL SALINE FLUSH 0.9 % IV SOLN
0.9 % | Freq: Two times a day (BID) | INTRAVENOUS | Status: DC
Start: 2014-07-19 — End: 2014-07-20

## 2014-07-19 MED ORDER — CEFAZOLIN SODIUM-DEXTROSE 2-3 GM-% IV SOLR
2-3 GM-% | INTRAVENOUS | Status: AC
Start: 2014-07-19 — End: ?

## 2014-07-19 MED ORDER — HYDROCODONE-ACETAMINOPHEN 5-325 MG PO TABS
5-325 MG | ORAL | Status: AC
Start: 2014-07-19 — End: ?

## 2014-07-19 MED ORDER — CEFAZOLIN SODIUM-DEXTROSE 2-3 GM-% IV SOLR
2-3 GM-% | INTRAVENOUS | Status: AC
Start: 2014-07-19 — End: 2014-07-19
  Administered 2014-07-19: 21:00:00 2 g via INTRAVENOUS

## 2014-07-19 MED ORDER — HYDROCODONE-ACETAMINOPHEN 5-325 MG PO TABS
5-325 MG | ORAL | Status: DC | PRN
Start: 2014-07-19 — End: 2014-07-20
  Administered 2014-07-19: 23:00:00 2 via ORAL

## 2014-07-19 MED ORDER — ONDANSETRON HCL 4 MG/2ML IJ SOLN
4 MG/2ML | Freq: Four times a day (QID) | INTRAMUSCULAR | Status: DC | PRN
Start: 2014-07-19 — End: 2014-07-20

## 2014-07-19 MED ORDER — ACETAMINOPHEN 325 MG PO TABS
325 MG | ORAL | Status: DC | PRN
Start: 2014-07-19 — End: 2014-07-20

## 2014-07-19 MED ORDER — HYDROCODONE-ACETAMINOPHEN 5-325 MG PO TABS
5-325 MG | ORAL | Status: DC | PRN
Start: 2014-07-19 — End: 2014-07-20

## 2014-07-19 MED ORDER — KETOROLAC TROMETHAMINE 30 MG/ML IJ SOLN
30 MG/ML | INTRAMUSCULAR | Status: AC
Start: 2014-07-19 — End: ?

## 2014-07-19 MED ORDER — BUPIVACAINE HCL (PF) 0.5 % IJ SOLN
0.5 % | INTRAMUSCULAR | Status: AC
Start: 2014-07-19 — End: ?

## 2014-07-19 MED ORDER — LABETALOL HCL 5 MG/ML IV SOLN
5 MG/ML | INTRAVENOUS | Status: DC | PRN
Start: 2014-07-19 — End: 2014-07-20

## 2014-07-19 MED ORDER — HYDRALAZINE HCL 20 MG/ML IJ SOLN
20 MG/ML | INTRAMUSCULAR | Status: DC | PRN
Start: 2014-07-19 — End: 2014-07-20

## 2014-07-19 MED ORDER — HYDROMORPHONE HCL 2 MG/ML IJ SOLN
2 MG/ML | INTRAMUSCULAR | Status: DC | PRN
Start: 2014-07-19 — End: 2014-07-20

## 2014-07-19 MED ORDER — PROPOFOL 10 MG/ML IV EMUL
10 MG/ML | INTRAVENOUS | Status: AC
Start: 2014-07-19 — End: ?

## 2014-07-19 MED FILL — ONDANSETRON HCL 4 MG/2ML IJ SOLN: 4 MG/2ML | INTRAMUSCULAR | Qty: 2

## 2014-07-19 MED FILL — FENTANYL CITRATE 0.05 MG/ML IJ SOLN: 0.05 MG/ML | INTRAMUSCULAR | Qty: 2

## 2014-07-19 MED FILL — KETOROLAC TROMETHAMINE 30 MG/ML IJ SOLN: 30 MG/ML | INTRAMUSCULAR | Qty: 1

## 2014-07-19 MED FILL — SENSORCAINE-MPF 0.5 % IJ SOLN: 0.5 % | INTRAMUSCULAR | Qty: 30

## 2014-07-19 MED FILL — SENSORCAINE-MPF/EPINEPHRINE 0.5% -1:200000 IJ SOLN: INTRAMUSCULAR | Qty: 30

## 2014-07-19 MED FILL — MIDAZOLAM HCL 2 MG/2ML IJ SOLN: 2 MG/ML | INTRAMUSCULAR | Qty: 2

## 2014-07-19 MED FILL — HYDROCODONE-ACETAMINOPHEN 5-325 MG PO TABS: 5-325 MG | ORAL | Qty: 2

## 2014-07-19 MED FILL — CEFAZOLIN SODIUM-DEXTROSE 2-3 GM-% IV SOLR: 2-3 GM-% | INTRAVENOUS | Qty: 2

## 2014-07-19 MED FILL — DIPRIVAN 10 MG/ML IV EMUL: 10 MG/ML | INTRAVENOUS | Qty: 20

## 2014-07-19 NOTE — Anesthesia Pre-Procedure Evaluation (Signed)
Department of Anesthesiology  Preprocedure Note       Name:  Bradley Oconnell   Age:  58 y.o.  DOB:  01-23-1956                                          MRN:  [id number]         Date:  07/19/2014      Surgeon:    Procedure:    Medications prior to admission:   Prior to Admission medications    Medication Sig Start Date End Date Taking? Authorizing Provider   meloxicam (MOBIC) 15 MG tablet Take 15 mg by mouth daily   Yes Historical Provider, MD   calcium carbonate (OSCAL) 500 MG TABS tablet Take 500 mg by mouth daily Per dr Dulce Sellar for bone healing   Yes Historical Provider, MD   Ascorbic Acid (VITAMIN C) 500 MG tablet Take 500 mg by mouth daily Per dr Dulce Sellar for bone healing   Yes Historical Provider, MD   magnesium oxide (MAG-OX) 400 MG tablet Take 400 mg by mouth daily Per dr Dulce Sellar for bone healing   Yes Historical Provider, MD   Vitamin D (CHOLECALCIFEROL) 1000 UNITS CAPS capsule Take 1,000 Units by mouth daily Per dr Dulce Sellar for bone healing   Yes Historical Provider, MD   Omega-3 Fatty Acids (FISH OIL PO) Take 1 tablet by mouth daily   Yes Historical Provider, MD   sulfamethoxazole-trimethoprim (BACTRIM DS) 800-160 MG per tablet Take 1 tablet by mouth 2 times daily for 10 days 07/12/14 07/22/14 Yes Sheila Oats, MD   finasteride (PROSCAR) 5 MG tablet Take 1 tablet by mouth daily 02/22/14 02/22/15 Yes Sheila Oats, MD   oxybutynin (DITROPAN) 5 MG tablet Take 1 tablet by mouth 2 times daily 02/22/14  Yes Sheila Oats, MD   mupirocin (BACTROBAN) 2 % ointment Apply internally to nares TID x 10 days 07/12/14 07/19/14  Sheila Oats, MD   tranylcypromine (PARNATE) 10 MG tablet Take 1 tablet by mouth 3 times daily 02/25/14   Sheila Oats, MD   topiramate (TOPAMAX) 100 MG tablet Take 1 tablet by mouth 3 times daily 02/25/14   Sheila Oats, MD   oxybutynin (DITROPAN) 5 MG tablet Take 1 tablet by mouth 2 times daily. 05/11/12 07/12/14  Sheila Oats, MD       Current medications:    Current  Outpatient Prescriptions   Medication Sig Dispense Refill   ??? meloxicam (MOBIC) 15 MG tablet Take 15 mg by mouth daily     ??? calcium carbonate (OSCAL) 500 MG TABS tablet Take 500 mg by mouth daily Per dr Dulce Sellar for bone healing     ??? Ascorbic Acid (VITAMIN C) 500 MG tablet Take 500 mg by mouth daily Per dr Dulce Sellar for bone healing     ??? magnesium oxide (MAG-OX) 400 MG tablet Take 400 mg by mouth daily Per dr Dulce Sellar for bone healing     ??? Vitamin D (CHOLECALCIFEROL) 1000 UNITS CAPS capsule Take 1,000 Units by mouth daily Per dr Dulce Sellar for bone healing     ??? Omega-3 Fatty Acids (FISH OIL PO) Take 1 tablet by mouth daily     ??? sulfamethoxazole-trimethoprim (BACTRIM DS) 800-160 MG per tablet Take 1 tablet by mouth 2 times daily for 10 days 20 tablet 0   ??? finasteride (PROSCAR) 5 MG  tablet Take 1 tablet by mouth daily 90 tablet 3   ??? oxybutynin (DITROPAN) 5 MG tablet Take 1 tablet by mouth 2 times daily 180 tablet 3   ??? mupirocin (BACTROBAN) 2 % ointment Apply internally to nares TID x 10 days 1 Tube 0   ??? tranylcypromine (PARNATE) 10 MG tablet Take 1 tablet by mouth 3 times daily 270 tablet 3   ??? topiramate (TOPAMAX) 100 MG tablet Take 1 tablet by mouth 3 times daily 270 tablet 3   ??? [DISCONTINUED] oxybutynin (DITROPAN) 5 MG tablet Take 1 tablet by mouth 2 times daily. 60 tablet 11     Current Facility-Administered Medications   Medication Dose Route Frequency Provider Last Rate Last Dose   ??? sodium chloride flush 0.9 % injection 10 mL  10 mL Intravenous 2 times per day Michele Mcalpine, DPM       ??? sodium chloride flush 0.9 % injection 10 mL  10 mL Intravenous PRN Michele Mcalpine, DPM           Allergies:  No Known Allergies    Problem List:    Patient Active Problem List   Diagnosis Code   ??? ED (erectile dysfunction) N52.9   ??? Testosterone deficiency E29.1   ??? OA (osteoarthritis) M19.90   ??? Overactive bladder N32.81   ??? Chronic depression F32.9   ??? Obesity (BMI 30.0-34.9) E66.9   ??? Inguinal hernia, left K40.90        Past Medical History:        Diagnosis Date   ??? Depression, recurrent (Geraldine)    ??? ED (erectile dysfunction)    ??? OA (osteoarthritis)    ??? Testosterone deficiency    ??? Genital warts    ??? Cervical pain (neck)        Past Surgical History:        Procedure Laterality Date   ??? Foot surgery Bilateral      x 4   ??? Hernia repair  2010   ??? Hemorrhoid surgery  2006   ??? Rotator cuff repair Right    ??? Colonoscopy       polyps   ??? Upper gastrointestinal endoscopy  04/15/13     DR. Ilsa Iha NO BX.       Social History:    History   Substance Use Topics   ??? Smoking status: Former Smoker     Quit date: 06/23/2011   ??? Smokeless tobacco: Never Used   ??? Alcohol Use: Yes      Comment: social                                Counseling given: Not Answered      Vital Signs (Current):   Filed Vitals:    07/12/14 0840 07/19/14 1425   BP:  117/74   Pulse:  61   Temp:  97.9 ??F (36.6 ??C)   TempSrc:  Temporal   Resp:  16   Height: 5\' 6"  (1.676 m) 5\' 6"  (1.676 m)   Weight: 194 lb (87.998 kg) 196 lb 12.8 oz (89.268 kg)   SpO2:  96%                                              BP Readings from Last 3 Encounters:  07/19/14 117/74   06/22/14 140/76   06/09/14 118/80       NPO Status: Time of last liquid consumption: 0600                        Time of last solid consumption: 2300                        Date of last liquid consumption: 07/19/14                        Date of last solid food consumption: 07/18/14    BMI:   Wt Readings from Last 3 Encounters:   07/19/14 196 lb 12.8 oz (89.268 kg)   06/22/14 206 lb 9.6 oz (93.713 kg)   06/09/14 194 lb 3.2 oz (88.089 kg)     Body mass index is 31.78 kg/(m^2).    Anesthesia Evaluation  Patient summary reviewed and Nursing notes reviewed no history of anesthetic complications:   Airway: Mallampati: II  TM distance: >3 FB   Neck ROM: limited  Mouth opening: > = 3 FB Dental: normal exam         Pulmonary:normal exam  breath sounds clear to auscultation      ROS comment: Former smoker    Cardiovascular:negative ROS  Exercise tolerance: good (>4 METS),                  Neuro/Psych:   (+) neuromuscular disease:, psychiatric history: stable with treatment      Comments: Cervical pain GI/Hepatic/Renal: neg ROS          Endo/Other:    (+) : arthritis: OA.         Comments: Genital warts  Testosterone deficiency Abdominal:                    Anesthesia Plan    ASA 2     general     intravenous induction   Anesthetic plan and risks discussed with patient.    Plan discussed with CRNA.            Gildardo Pounds Opdyke, DO   07/19/2014

## 2014-07-19 NOTE — Op Note (Signed)
[facility]                                    [place], [place]                                   RECORD OF OPERATION     PATIENT NAME: Bradley Oconnell, Bradley Oconnell               DOB: Jun 16, 1956  MEDICAL RECORD NO. [id number]                 ROOM: SE  ACCOUNT NO: [id number]                          DATE: 07/19/2014  SURGEON: Marina Gravel. Tessa Lerner, D.P.M.        This is Danae Orleans, D.P.M., PGY-1 dictating an operative report on  behalf of Dr. Madalyn Rob, D.P.M.     SURGEON: Dr. Madalyn Rob, D.P.M.     ASSISTANT: Danae Orleans, D.P.M., PGY-1.     PREOPERATIVE DIAGNOSIS:    1.    Hammertoe left second toe.    2.    Hammertoe left third toe.    3.    Hammertoe left fourth toe.    4.    Painful hardware irritation left foot.    5.    Hallux abductovalgus left.     POSTOPERATIVE DIAGNOSIS:    1.    Hammertoe left second toe.    2.    Hammertoe left third toe.    3.    Hammertoe left fourth toe.    4.    Painful hardware irritation left foot.    5.    Hallux abductovalgus left.     PROCEDURES:    1.    Arthroplasty proximal interphalangeal joint left second toe.    2.    Tier arthroplasty of interphalangeal joint and distal interphalangeal          joint left third toe.    3.    Arthroplasty left interphalangeal joint and distal interphalangeal          joint left toe.    4.    Hardware removal left foot.    5.    Extensor hallucis tendon release, left.     ANESTHESIA: MAC, General.     HEMOSTASIS: Well padded pneumatic ankle tourniquet at 250 mmHg.     ESTIMATED BLOOD LOSS: Less than 3 ml.     MATERIALS: 3 Arthrex Trim-It pins.     INJECTABLES: 30 ml of 0.5 percent Marcaine with epinephrine.     CONDITION: Good.     COMPLICATIONS: None.     SPECIMENS: Were not obtained.     INDICATIONS: The patient is a 58 year old male with a chief complaint of  hardware irritation and painful hammertoes 2-4 who was being seen by Dr.  Dulce Sellar and being treated for painful hardware irritation hammertoes 2-4.   At  this time all conservative options have been exhausted and surgical  intervention is necessary.  The patient has been explained to the patient and  they understand the risks, benefits and possible complications including  nonunion, continued pain, possible recurrence of the deformity, toe  stiffness, wound dehiscence and infection.  No promises have been made as  to  the surgical outcome.     PROCEDURE: The patient was transferred from the preop holding to the  operating room table and placed in a supine position.  A pneumatic ankle  tourniquet was applied to the left ankle.  The foot was then prepped and  draped in a normal aseptic manner.  The left foot was then exsanguinated with  an Esmarch and the tourniquet was inflated to 250 mmHg.     A curvilinear incision was made along the dorsal aspect of the left second  digit following the scar from a previous incision.  This incision extended  down to the proximal interphalangeal joint to the metatarsophalangeal joint.  The incision was approximately 4 cm in length.  It was made full thickness  using a scalpel blade down to the level of the bone.  The skin incisions were  then carefully retracted using skin hook retractors and blunt dissection was  carried down to the level of the bone.  Care was taken to avoid all  neurovascular structures as well as Bovie all small bleeding vessels.  The  scalpel blade was then used to make a transverse tenotomy cut on the most  dorsal aspect of the proximal interphalangeal joint.  The lateral and  collateral ligaments were then released.  Following the transverse tenotomy  exposure of the dorsal extensor hood apparatus was loaded triple arm over the  metatarsophalangeal joint itself.  The dorsal capsule as well as the medial  and lateral collateral ligaments were transected allowing for adequate  exposure of the metatarsal head itself.  A TPS sagittal saw was then used to  remove the proximal phalanx head, approximately 3 mm in  length, removing all  articular cartilage.  A rongeur was then used to remove the medial, dorsal  and lateral bony prominences following the osteotomy cut.  This cut was then  repeated on the middle phalanx.  At this time a Trim-It pin guide was  obtained and drilled into the proximal phalanx  measuring approximately 20 mm  in length.  This was then repeated and drilled into the middle and distal  phalanx and out the toe measuring approximately 15 cm in length.  A Trim-It  pin was obtained and the accurate length was then trimmed.  A Trim-It pin was  then driven through the middle phalanx and out of the toe distally and then  retrogradely driven back into the middle phalanx.  Once adequate length was  obtained, the Trim-It pin was held with a hemostat.  The drill was ran and  the pin broken off at adequate length.  The toe was then compressed manually allowing for  adequate bone-on-bone apposition.  This incision was flushed with copious  amounts of normal sterile saline and skin closed with 4-0 Prolene.     Attention was then directed to the left third digit where the same procedure  was performed.  Attention was then directed to the left fourth digit where a  staple was placed into the proximal and middle phalanx.  The staple was  removed using a Soil scientist.  There was also a K-wire in this joint.  Once  adequate dissection of the joint was obtained, the pin was then removed.  Again, the Trim-It pin technique, as described before, was used on the fourth  digit.  All incisions were closed using 4-0 Prolene for the skin.     Attention was then directed to the medial aspect of the left first MPJ  where  a screw was noted to be backing out of the bone on the x-ray.  This was  causing pain and irritation.  At this time a full thickness incision down to  the level of the bone was made using a 15 blade scalpel and dissection was  carried down until the screw had been visualized.  Once the screw had been  visualized a  hemostat was placed on the screw and it was removed.  At the  conclusion the skin was flushed with copious amounts of normal sterile saline  and closed using 4-0 Prolene.     Attention was then directed to the lateral aspect of the first  metatarsophalangeal joint where a 15 blade was used to make a stab incision  down to the level of bone phalanx of the extensor tendon.  Once the extensor  tendon was felt it was cut transversely helping to relieve some of the hallux  abductovalgus deformity.  This incision was then flushed with copious amounts  of normal sterile saline and closed using Prolene.     Incisions were then covered with Adaptic followed by 4x4s, Kerlix,  stockinette and an Ace bandage.  The tourniquet was deflated.  Anesthesia was  discontinued with immediate hyperemic response noted to the operative foot.  The patient was transferred to PACU with vital signs and neurovascular status  intact to the operative foot.  The patient may weightbear as tolerated to the  operative foot and a postop shoe.  The patient will follow up with Dr.  Dulce Sellar as previously scheduled.  Dr. Dulce Sellar was present preoperatively and  postoperatively for any questions from the patient and his family.        Marina Gravel. Tessa Lerner, D.P.M.        D: 07/19/2014 20:42                                   T: 07/20/2014 05:13  lgl     CC:  Marina Gravel. Tessa Lerner[place] [address]     Madalyn Rob, D.P.M.  [facility]  [address] [place] [address]

## 2014-07-19 NOTE — H&P (Signed)
[facility]  History and Physical Update    Pt Name: Bradley Oconnell  MRN: [id number]  Birthdate: September 20, 1955  Date of evaluation: 07/19/2014    [x]  I have examined the patient and reviewed the H&P/Consult and there are no changes to the patient or plans.    []  I have examined the patient and reviewed the H&P/Consult and have noted the following changes:        Glenice Laine DPM  Electronically signed 07/19/2014 at 10:07 AM

## 2014-07-19 NOTE — Progress Notes (Signed)
Bradley Oconnell stated that when he arrived home after his surgery on 06/22/14 he had a very painful area on his left thigh above his left knee and it is still numb in that area.

## 2014-07-19 NOTE — Brief Op Note (Signed)
Brief Postoperative Note   07/19/2014  Bradley Oconnell  57 y.o.      Surgeon: Dr. Madalyn Rob, DPM    Assistants: Aliene Altes, DPM      Pre-operative Diagnosis:    1. Hammer toe Left 2nd toe   2. Hammer toe left 3rd toe    3. Hammer toe left 4th toe    4. Painful hardware irritation left foot   5. Hallux abducto valgus left      Post-operative Diagnosis:    Same    Procedure:    1. Arthroplasty of IPJ left 2nd toe   2. Arthroplasty  left IPJ and DIPJ left 3rd toe   3. Arthroplasty left IPJ and DIPJ left 4th toe   4. Hardware removal left foot   5. Extensor hallucis tendon release left      Anesthesia: MAC    Hemostasis: A well padded pneumatic ankle Tourniquet at 250 mmHG     Estimated Blood Loss: 3 cc    Materials: (3) arthrex trim it pins     Injectables: 30 cc of 0.5% marcaine with epi    Condition: good     Complications: none     Specimens: were not obtained      Aliene Altes, DPM  Podiatric Surgical Resident  07/19/2014

## 2014-07-19 NOTE — Discharge Instructions (Signed)
Post Op Instructions    Pt Name: Bradley Oconnell  Medical Record Number: [id number]  Today's Date: 07/19/2014    GENERAL ANESTHESIA OR SEDATION  1. Do not drive or operate hazardous machinery for 24 hours.  2. Do not make important business or personal decisions for 24 hours.  3. Do not drink alcoholic beverages or use tobacco for 24 hours.    ACTIVITY INSTRUCTIONS:  []  Rest today. Resume light to normal activity tomorrow.   [x]  You may ambulate as tolerated in your post op shoe/boot.  []  No weight is to be placed on the operative limb.  Use crutches, walker, Roll-a-bout as needed.  [x] Elevate the operative limb as much as possible to reduce swelling and discomfort for the first 72 hours      DIET INSTRUCTIONS:  [] Begin with clear liquids. If not nauseated, may increase to a low-fat diet when you desire.   [x] Regular diet as tolerated.  [] Other:     MEDICATIONS  [x] Prescription sent with you to be used as directed.   [] Toradol   [] Vicodin   [] Percocet   [] Tylenol #3   [] Duricef   Do not drink alcohol or drive while taking these medications. You may experience dizziness or drowsiness with these medications. You may also experience constipation which can be relieved with stool softners or laxatives.  [x] You may resume your daily prescription medication schedule unless otherwise specified.  [x] If taking 325mg  Aspirin or other blood thinners such as Coumadin or Plavix, you may resume tomorrow, unless told otherwise.     WOUND/DRESSING INSTRUCTIONS:  Always ensure you and your care giver clean hands before and after caring for the wound.  [x]  Keep dressing clean and dry until you are seen in the offfice      [x]  Ice operative site for 20 minutes 4 times a day.   - you may apply ice bag behind the knee or directly on the foot/ankle bandage.   - do not apply ice directly to the skin  [x]  You may loosen the ACE wrap if it feels too tight, but do not disturb the underlying white gauze wrap.      FOLLOW-UP CARE. SPECIFICALLY  WATCH FOR:   Fever over 101 degrees by mouth   Increased redness, warmth, hardness at operative site.   Blood soaked dressing (small amounts of oozing may be normal.)   Increased or progressive drainage from the surgical area   Inability to urinate or blood in the urine   Pain not relieved by the medications ordered   Persistent nausea and/or vomiting, unable to retain fluids.   Swelling, increased redness, warmth, or hardness around operative area   Numb, tingling or cold toes   Toe(s) become white or bluish    FOLLOW-UP APPOINTMENT   [] 1 week   [] 2 weeks   [x] Other, As prevously scheduled     Call my office if you have any problem that concerns you [phone]. After hours, you can reach the answering service via the office phone number. IF YOU NEED IMMEDIATE ATTENTION, GO TO THE EMERGENCY ROOM AND YOUR DOCTOR WILL BE CONTACTED.      Dr. Madalyn Rob, DPM    Danae Orleans, DPM PGY-I  Foot and Ankle Surgical Resident  07/19/2014  5:34 PM

## 2014-07-19 NOTE — Anesthesia Post-Procedure Evaluation (Signed)
Anesthesia Post-op Note    Patient: Bradley Oconnell  MRN: [id number]  Birthdate: 06-23-1956  Date of evaluation: 07/19/2014  Time:  5:43 PM     Procedure(s) Performed:     Last Vitals: BP 125/68 mmHg   Pulse 75   Temp(Src) 97.2 ??F (36.2 ??C) (Temporal)   Resp 22   Ht 5\' 6"  (1.676 m)   Wt 196 lb 12.8 oz (89.268 kg)   BMI 31.78 kg/m2   SpO2 96%    Aldrete Phase I: Aldrete Score: 6    Aldrete Phase II:      Anesthesia Post Evaluation    Final anesthesia type: general  Patient location during evaluation: PACU  Patient participation: complete - patient participated  Level of consciousness: sleepy but conscious  Pain score: 0  Airway patency: patent  Nausea & Vomiting: no nausea and no vomiting  Complications: no  Cardiovascular status: hemodynamically stable  Respiratory status: spontaneous ventilation, nasal cannula and acceptable  Hydration status: stable        Ysidro Evert D. Rayola Everhart, DO  5:43 PM

## 2014-07-20 NOTE — Progress Notes (Signed)
Wallace. WANTS TO GET DRESSED TO GO HOME .

## 2014-07-21 ENCOUNTER — Inpatient Hospital Stay: Attending: Foot & Ankle Surgery

## 2014-09-14 ENCOUNTER — Telehealth

## 2014-09-14 ENCOUNTER — Encounter

## 2014-09-14 DIAGNOSIS — M25512 Pain in left shoulder: Secondary | ICD-10-CM

## 2014-09-14 MED ORDER — CELECOXIB 200 MG PO CAPS
200 MG | ORAL_CAPSULE | Freq: Every day | ORAL | Status: DC
Start: 2014-09-14 — End: 2015-01-02

## 2014-09-14 NOTE — Telephone Encounter (Signed)
Patient called and is having a lot of swelling and side effects from the mobic.  Dr. Dulce Sellar switched him from the mobic to celebrex and sent it to a local pharmacy but is too expensive locally.  Is asking if you will send him a rx for the celebrex to [facility]. Celebrex 200 mg 1 po daily

## 2014-09-14 NOTE — Telephone Encounter (Signed)
ep'ed celebrex to pharm requested

## 2014-09-15 ENCOUNTER — Inpatient Hospital Stay: Admit: 2014-09-15 | Attending: Orthopaedic Surgery

## 2014-09-29 NOTE — Telephone Encounter (Signed)
appt scheduled for 10/04/2014 @ 415pm

## 2014-09-29 NOTE — Telephone Encounter (Signed)
Pt called and would like to have an epidural for his shoulder/neck pain @ [facility].  He had them done in the past and would like to have it done again.  Can he have order for the epidural, he is willing to come in for an appt if needed.    Also states prior Josem Kaufmann is needed for the celebrex, called [facility] spoke to Jette, prior auth done and approved 09/28/14-09/29/15 #[id number]

## 2014-09-29 NOTE — Telephone Encounter (Signed)
Would need appt to set up epidural.

## 2014-10-04 ENCOUNTER — Ambulatory Visit: Admit: 2014-10-04 | Discharge: 2014-10-04 | Payer: BLUE CROSS/BLUE SHIELD | Attending: Family Medicine

## 2014-10-04 DIAGNOSIS — M4802 Spinal stenosis, cervical region: Secondary | ICD-10-CM

## 2014-10-04 MED ORDER — TOLTERODINE TARTRATE ER 4 MG PO CP24
4 MG | ORAL_CAPSULE | Freq: Every day | ORAL | Status: DC
Start: 2014-10-04 — End: 2015-03-02

## 2014-10-04 NOTE — Progress Notes (Signed)
Have you seen any other physician or provider since your last visit yes - Dr Allyne Gee    Have you had any other diagnostic tests since your last visit? yes - MRI    Have you changed or stopped any medications since your last visit including any over-the-counter medicines, vitamins, or herbal medicines? no     Are you taking all your prescribed medications? Yes    If NO, why?

## 2014-10-04 NOTE — Progress Notes (Signed)
Subjective:      Patient ID: Bradley Oconnell is a 59 y.o. male.    HPI  Pt here for possible epidural for his neck.  Has had previously, they were successful.  Has known cervical spinal stenosis per MRI.  Reviewed BMI of 32.  Encouraged diet, exercise and weight loss.  Also seeing [facility] for torn rotator cuff on the left.  Partnered, former smoker,pmh reviewed.    Review of Systems   Constitutional: Negative for fever, chills, fatigue and unexpected weight change.   HENT: Negative for congestion, ear pain, rhinorrhea and sore throat.    Eyes: Negative for pain and visual disturbance.   Respiratory: Negative for cough, chest tightness, shortness of breath and wheezing.    Cardiovascular: Negative for chest pain and palpitations.   Gastrointestinal: Negative for nausea, vomiting, abdominal pain, diarrhea, constipation and blood in stool.   Genitourinary: Negative for urgency, frequency, hematuria and difficulty urinating.   Musculoskeletal: Positive for neck pain. Negative for myalgias, back pain and joint swelling.   Skin: Negative for rash.   Neurological: Negative for dizziness and headaches.   Hematological: Negative for adenopathy. Does not bruise/bleed easily.   Psychiatric/Behavioral: Negative for behavioral problems and sleep disturbance. The patient is not nervous/anxious.        Objective:   Physical Exam   Constitutional: He is oriented to person, place, and time. He appears well-developed and well-nourished.   HENT:   Head: Normocephalic and atraumatic.   Right Ear: External ear normal.   Left Ear: External ear normal.   Nose: Nose normal.   Mouth/Throat: Oropharynx is clear and moist.   Eyes: EOM are normal. Pupils are equal, round, and reactive to light.   Neck: Neck supple. No thyromegaly present.   Cardiovascular: Normal rate, regular rhythm and normal heart sounds.    Pulmonary/Chest: Breath sounds normal. He has no wheezes. He has no rales.   Abdominal: Soft. Bowel sounds are normal. There is no  tenderness. There is no rebound and no guarding.   Musculoskeletal: Normal range of motion. He exhibits no edema.   Lymphadenopathy:     He has no cervical adenopathy.   Neurological: He is alert and oriented to person, place, and time. He has normal reflexes. No cranial nerve deficit.   Skin: Skin is warm and dry. No rash noted.   Psychiatric: He has a normal mood and affect.   Nursing note and vitals reviewed.      Assessment:      Cervical spinal stenosis  OAB       Plan:      Epidural x 3 cervical, [facility].  Detrol LA 4 mg nightly  Stop ditropan.

## 2014-10-05 ENCOUNTER — Telehealth

## 2014-10-05 NOTE — Telephone Encounter (Signed)
Patient is asking if you could order some blood work to check his rheumatoid factor.  Would like to have it done at [facility].  Call him back at [phone] if any questions

## 2014-10-05 NOTE — Telephone Encounter (Signed)
Lab order faxed to [facility].  Patient notified

## 2014-10-05 NOTE — Telephone Encounter (Signed)
ok for RF --- Dx: arthralgia

## 2014-10-11 LAB — RHEUMATOID FACTOR: Rheumatoid Factor: <10 IU/mL (ref 0–13)

## 2014-10-11 NOTE — Telephone Encounter (Signed)
-----   Message from Sheila Oats, MD sent at 10/11/2014  2:37 PM EDT -----  Notify RF is normal.

## 2014-10-11 NOTE — Telephone Encounter (Signed)
Left message that RF is normal.

## 2014-10-20 ENCOUNTER — Inpatient Hospital Stay: Admit: 2014-10-20

## 2014-10-20 DIAGNOSIS — M4802 Spinal stenosis, cervical region: Secondary | ICD-10-CM

## 2014-10-20 MED ORDER — STERILE WATER FOR INJECTION IJ SOLN
Freq: Once | INTRAMUSCULAR | Status: AC
Start: 2014-10-20 — End: 2014-10-20
  Administered 2014-10-20: 16:00:00 2 mL via INTRAMUSCULAR

## 2014-10-20 MED ORDER — LIDOCAINE HCL 2 % IJ SOLN
2 % | INTRAMUSCULAR | Status: AC
Start: 2014-10-20 — End: ?

## 2014-10-20 MED ORDER — STERILE WATER FOR INJECTION IJ SOLN
INTRAMUSCULAR | Status: AC
Start: 2014-10-20 — End: ?

## 2014-10-20 MED ORDER — IOHEXOL 180 MG/ML IJ SOLN
180 MG/ML | Freq: Once | INTRAMUSCULAR | Status: AC | PRN
Start: 2014-10-20 — End: 2014-10-20
  Administered 2014-10-20: 16:00:00 15 mL via INTRAVENOUS

## 2014-10-20 MED ORDER — METHYLPREDNISOLONE ACETATE 80 MG/ML IJ SUSP
80 MG/ML | Freq: Once | INTRAMUSCULAR | Status: AC
Start: 2014-10-20 — End: 2014-10-20
  Administered 2014-10-20: 16:00:00 80 mg via INTRA_ARTICULAR

## 2014-10-20 MED ORDER — METHYLPREDNISOLONE ACETATE 80 MG/ML IJ SUSP
80 MG/ML | INTRAMUSCULAR | Status: AC
Start: 2014-10-20 — End: ?

## 2014-10-20 MED FILL — DEPO-MEDROL 80 MG/ML IJ SUSP: 80 MG/ML | INTRAMUSCULAR | Qty: 1

## 2014-10-20 MED FILL — LIDOCAINE HCL 2 % IJ SOLN: 2 % | INTRAMUSCULAR | Qty: 20

## 2014-10-20 MED FILL — STERILE WATER FOR INJECTION IJ SOLN: INTRAMUSCULAR | Qty: 10

## 2014-10-20 NOTE — Progress Notes (Incomplete)
1122 Patient received in IR for cervical epidural. Patient did not go through OPN this was okay with Dr. Robin Searing  1126 This procedure has been fully reviewed with the patient and written informed consent has been obtained.  1136 Procedure started with Dr. Robin Searing  1205 Procedure completed patient tolerated well. Band aid to cervical area no bleeding noted area soft  1208 Patient discharged in stable condition.

## 2014-11-24 NOTE — Telephone Encounter (Signed)
Pt Bradley Oconnell called requesting information.  A dr he works w/ saw a nose lesion and recommended he have it looked at, so he made appt w/ Dr Jeanette Caprice for 12/20/03.  The receptionist there suggested he may need a family physician referral for this appt.  He said he would call his insurance co to check, but he's pretty sure he needs 1 and would like [place] to do it.  Says you have never seen him for this lesion.  Pls advise.  Pls call pt at [phone] to reply.

## 2014-11-24 NOTE — Telephone Encounter (Signed)
If he needs a referral, he would need an appt.

## 2014-11-25 NOTE — Telephone Encounter (Signed)
Pt notified that he needs an appt with [facility] - pt states he called his insurance and they stated he doesn't need a referral from his pcp as long as Dr Jeanette Caprice is in network.  Pt states Dr Jeanette Caprice is in network.

## 2014-12-15 ENCOUNTER — Ambulatory Visit: Admit: 2014-12-15 | Discharge: 2014-12-15 | Payer: BLUE CROSS/BLUE SHIELD | Attending: Urology

## 2014-12-15 DIAGNOSIS — IMO0001 Reserved for inherently not codable concepts without codable children: Secondary | ICD-10-CM

## 2014-12-15 LAB — POCT URINE DIPSTICK
Leukocytes, UA: NEGATIVE
Nitrite, UA: NEGATIVE

## 2014-12-15 LAB — POST VOID RESIDUAL (PVR): post void residual: 24 ml

## 2014-12-15 MED ORDER — MIRABEGRON ER 25 MG PO TB24
25 MG | ORAL_TABLET | Freq: Every day | ORAL | Status: DC
Start: 2014-12-15 — End: 2015-02-09

## 2014-12-15 NOTE — Progress Notes (Signed)
Subjective:      Patient ID: Bradley Oconnell 59 y.o. male January 07, 1956    Chief Complaint   Patient presents with   ??? Advice Only     new pt., frequency       Other  This is a chronic (overactive bladder) problem. The current episode started more than 1 year ago. The problem occurs constantly. The problem has been unchanged. Pertinent negatives include no chest pain, chills, fever, nausea, neck pain, rash or vomiting. Nothing aggravates the symptoms. He has tried nothing for the symptoms. The treatment provided no relief.       Past Medical History   Diagnosis Date   ??? Depression, recurrent ([place])    ??? ED (erectile dysfunction)    ??? OA (osteoarthritis)    ??? Testosterone deficiency    ??? Genital warts    ??? Cervical pain (neck)        History     Social History   ??? Marital Status: Married     Spouse Name: N/A     Number of Children: N/A   ??? Years of Education: N/A     Occupational History   ??? Not on file.     Social History Main Topics   ??? Smoking status: Former Smoker     Quit date: 06/23/2011   ??? Smokeless tobacco: Never Used   ??? Alcohol Use: Yes      Comment: social   ??? Drug Use: No   ??? Sexual Activity: Yes     Other Topics Concern   ??? Not on file     Social History Narrative       Family History   Problem Relation Age of Onset   ??? Parkinsonism Mother    ??? Arthritis Mother    ??? Heart Disease Father      Congestive Heart   ??? Cancer Father    ??? Stroke Father    ??? Arthritis Father    ??? Depression Father    ??? Asthma Neg Hx    ??? Birth Defects Neg Hx    ??? Diabetes Neg Hx    ??? Hearing Loss Neg Hx    ??? High Blood Pressure Neg Hx    ??? High Cholesterol Neg Hx    ??? Kidney Disease Neg Hx    ??? Learning Disabilities Neg Hx    ??? Mental Illness Neg Hx    ??? Mental Retardation Neg Hx    ??? Miscarriages / Stillbirths Neg Hx    ??? Substance Abuse Neg Hx    ??? Vision Loss Neg Hx    ??? Other Neg Hx    ??? Depression Son    ??? Early Death Son        Past Surgical History   Procedure Laterality Date   ??? Foot surgery Bilateral      x 4   ??? Hernia  repair  2010   ??? Hemorrhoid surgery  2006   ??? Rotator cuff repair Right    ??? Colonoscopy       polyps   ??? Upper gastrointestinal endoscopy  04/15/13     DR. Ilsa Iha NO BX.   ??? Foot surgery Left 07/19/2014       No Known Allergies      Current outpatient prescriptions:   ???  tolterodine (DETROL LA) 4 MG ER capsule, Take 1 capsule by mouth daily, Disp: 30 capsule, Rfl: 0  ???  celecoxib (CELEBREX) 200 MG capsule, Take 1  capsule by mouth daily, Disp: 90 capsule, Rfl: 1  ???  calcium carbonate (OSCAL) 500 MG TABS tablet, Take 500 mg by mouth daily Per dr Dulce Sellar for bone healing, Disp: , Rfl:   ???  Ascorbic Acid (VITAMIN C) 500 MG tablet, Take 500 mg by mouth daily Per dr Dulce Sellar for bone healing, Disp: , Rfl:   ???  magnesium oxide (MAG-OX) 400 MG tablet, Take 400 mg by mouth daily Per dr Dulce Sellar for bone healing, Disp: , Rfl:   ???  Vitamin D (CHOLECALCIFEROL) 1000 UNITS CAPS capsule, Take 1,000 Units by mouth daily Per dr Dulce Sellar for bone healing, Disp: , Rfl:   ???  Omega-3 Fatty Acids (FISH OIL PO), Take 1 tablet by mouth daily, Disp: , Rfl:   ???  tranylcypromine (PARNATE) 10 MG tablet, Take 1 tablet by mouth 3 times daily, Disp: 270 tablet, Rfl: 3  ???  topiramate (TOPAMAX) 100 MG tablet, Take 1 tablet by mouth 3 times daily, Disp: 270 tablet, Rfl: 3  ???  finasteride (PROSCAR) 5 MG tablet, Take 1 tablet by mouth daily, Disp: 90 tablet, Rfl: 3  ???  [DISCONTINUED] oxybutynin (DITROPAN) 5 MG tablet, Take 1 tablet by mouth 2 times daily., Disp: 60 tablet, Rfl: 11    Review of Systems   Constitutional: Negative for fever and chills.   Eyes: Negative for pain and redness.   Respiratory: Negative for chest tightness and shortness of breath.    Cardiovascular: Negative for chest pain and leg swelling.   Gastrointestinal: Negative for nausea and vomiting.   Endocrine: Negative for cold intolerance and heat intolerance.   Genitourinary: Positive for urgency and frequency.   Musculoskeletal: Negative for neck pain and neck stiffness.   Skin:  Negative for color change and rash.   Allergic/Immunologic: Negative for environmental allergies and food allergies.   Neurological: Negative for dizziness and light-headedness.   Hematological: Negative for adenopathy. Bruises/bleeds easily.       BP 110/80 mmHg   Ht 5\' 7"  (1.702 m)   Wt 195 lb (88.451 kg)   BMI 30.53 kg/m2    Objective:   Physical Exam   Constitutional: He is oriented to person, place, and time. Vital signs are normal. He appears well-developed and well-nourished. He is cooperative. No distress.   HENT:   Head: Normocephalic and atraumatic.   Mouth/Throat: Oropharynx is clear and moist and mucous membranes are normal. No oropharyngeal exudate.   Eyes: EOM are normal. Pupils are equal, round, and reactive to light. Right eye exhibits no discharge. Left eye exhibits no discharge. No scleral icterus.   Neck: Trachea normal. No JVD present. No tracheal deviation present.   Pulmonary/Chest: Effort normal. No respiratory distress. He has no wheezes.   Abdominal: Soft. He exhibits no distension. There is no tenderness. There is no rebound and no CVA tenderness.   Genitourinary: Rectum normal and penis normal. No penile tenderness.   Prostate minimally enlarged and smooth without nodule or induration.   Musculoskeletal: He exhibits no edema or tenderness.   Lymphadenopathy:        Right: No supraclavicular adenopathy present.        Left: No supraclavicular adenopathy present.   Neurological: He is alert and oriented to person, place, and time. No cranial nerve deficit.   Skin: Skin is warm and dry. He is not diaphoretic.   Psychiatric: He has a normal mood and affect. His behavior is normal.   Nursing note and vitals reviewed.    Labs  Results for POC orders placed in visit on 12/15/14   POCT Urinalysis no Micro   Result Value Ref Range    Color, UA      Clarity, UA      Glucose, UA POC      Bilirubin, UA      Ketones, UA      Spec Grav, UA      Blood, UA POC trace     pH, UA      Protein, UA POC       Urobilinogen, UA      Leukocytes, UA neg     Nitrite, UA neg     Appearance, Fluid  Clear, Slightly Cloudy   poct post void residual   Result Value Ref Range    post void residual 24 ml       Lab Results   Component Value Date    CREATININE 0.9 12/20/2014    BUN 22 12/20/2014    NA 138 12/20/2014    K 3.7 12/20/2014    CL 104 12/20/2014    CO2 19* 12/20/2014       Lab Results   Component Value Date    PSA 0.31 05/22/2012       Assessment:      1. Frequency  POCT Urinalysis no Micro    poct post void residual       Mr. Mahany presents today in consultation for Primary Diagnosis: Frequency [R35.0].    This has been a long standing problem.  He also has microscopic hematuria but he reports this was well worked up in the past and no abnormality found.  He had a cystoscopy as part of that workup.  His biggest complaint is bladder overactivity and severe urgency and frequency.  It is bothersome and has been causing him frustration.  He has tried several medications but none of these have helped much.  He has never tried [employer].        Plan:        We will try Myrbetriq and I will follow up with Waunita Schooner at the hospital to see if the medication is working.

## 2014-12-20 ENCOUNTER — Inpatient Hospital Stay
Admit: 2014-12-20 | Discharge: 2014-12-20 | Disposition: A | Discharge status: Home / Self Care | Payer: BLUE CROSS/BLUE SHIELD

## 2014-12-20 DIAGNOSIS — R197 Diarrhea, unspecified: Secondary | ICD-10-CM

## 2014-12-20 LAB — CBC WITH AUTO DIFFERENTIAL
Basophils Absolute: 0 10*3/uL (ref 0.0–0.1)
Basophils: 0 %
Eosinophils Absolute: 0.1 10*3/uL (ref 0.0–0.4)
Eosinophils: 0.9 %
Hematocrit: 44.8 % (ref 42.0–52.0)
Hemoglobin: 15.1 gm/dl (ref 14.0–18.0)
Lymphocytes Absolute: 0.3 10*3/uL — ABNORMAL LOW (ref 1.0–4.8)
Lymphocytes: 3.5 %
MCH: 31 pg (ref 27.0–31.0)
MCHC: 33.8 gm/dl (ref 33.0–37.0)
MCV: 91.6 fL (ref 80.0–94.0)
MPV: 7.7 mcm (ref 7.4–10.4)
Monocytes Absolute: 0.6 10*3/uL (ref 0.4–1.3)
Monocytes: 7 %
Platelets: 177 10*3/uL (ref 130–400)
RBC Morphology: NORMAL
RBC: 4.89 10*6/uL (ref 4.70–6.10)
RDW: 13.4 % (ref 11.5–14.5)
Seg Neutrophils: 88.6 %
Segs Absolute: 7 10*3/uL (ref 1.8–7.7)
WBC: 7.9 10*3/uL (ref 4.8–10.8)
nRBC: 0 /100 wbc

## 2014-12-20 LAB — HEPATIC FUNCTION PANEL
ALT: 19 U/L (ref 11–66)
AST: 21 U/L (ref 5–40)
Albumin: 4.3 gm/dl (ref 3.5–5.1)
Alkaline Phosphatase: 58 U/L (ref 38–126)
Bilirubin, Direct: <0.2 mg/dl (ref 0.0–0.3)
Total Bilirubin: 0.6 mg/dl (ref 0.3–1.2)
Total Protein: 6.9 gm/dl (ref 6.1–8.0)

## 2014-12-20 LAB — TROPONIN: Troponin T: <0.01 ng/ml

## 2014-12-20 LAB — AMYLASE: Amylase: 39 U/L (ref 20–104)

## 2014-12-20 LAB — URINALYSIS WITH REFLEX TO CULTURE
Bilirubin Urine: NEGATIVE
Blood, Urine: NEGATIVE
Glucose, Ur: NEGATIVE mg/dl
Leukocyte Esterase, Urine: NEGATIVE
Nitrite, Urine: NEGATIVE
Protein, UA: NEGATIVE
Specific Gravity, Urine: 1.02 (ref 1.002–1.03)
Urobilinogen, Urine: 0.2 eu/dl (ref 0.0–1.0)
pH, UA: 5.5 (ref 5.0–9.0)

## 2014-12-20 LAB — BASIC METABOLIC PANEL
BUN: 22 mg/dl (ref 7–22)
CO2: 19 meq/l — ABNORMAL LOW (ref 23–33)
Calcium: 8.5 mg/dl (ref 8.5–10.5)
Chloride: 104 meq/l (ref 98–111)
Creatinine: 0.9 mg/dl (ref 0.4–1.2)
Glucose: 102 mg/dl (ref 70–108)
Potassium: 3.7 meq/l (ref 3.5–5.2)
Sodium: 138 meq/l (ref 135–145)

## 2014-12-20 LAB — LIPASE: Lipase: 21.9 U/L (ref 5.6–51.3)

## 2014-12-20 LAB — ANION GAP: Anion Gap: 15 meq/l (ref 8.0–16.0)

## 2014-12-20 LAB — GLOMERULAR FILTRATION RATE, ESTIMATED: Est, Glom Filt Rate: 86 mL/min/{1.73_m2} — AB

## 2014-12-20 LAB — OSMOLALITY: Osmolality Calc: 279.2 mOsmol/kg (ref 275.0–300)

## 2014-12-20 MED ORDER — PANTOPRAZOLE SODIUM 40 MG PO TBEC
40 MG | ORAL_TABLET | Freq: Every day | ORAL | Status: DC
Start: 2014-12-20 — End: 2015-03-02

## 2014-12-20 MED ORDER — ONDANSETRON 4 MG PO TBDP
4 MG | Freq: Once | ORAL | Status: AC
Start: 2014-12-20 — End: 2014-12-20
  Administered 2014-12-20: 15:00:00 4 mg via ORAL

## 2014-12-20 MED ORDER — ONDANSETRON 4 MG PO TBDP
4 MG | ORAL_TABLET | Freq: Three times a day (TID) | ORAL | Status: AC | PRN
Start: 2014-12-20 — End: 2014-12-24

## 2014-12-20 MED FILL — ONDANSETRON 4 MG PO TBDP: 4 MG | ORAL | Qty: 1

## 2014-12-20 NOTE — ED Notes (Signed)
Pt states zofran helped. Pt denies any nausea/vomiting since arrival. Pt tolerating PO fluids.     Artemio Aly  12/20/14 1344

## 2014-12-20 NOTE — ED Notes (Signed)
Pt c/o nausea, epigastric pain, vomiting, and watery diarrhea that began last night around 2000. Pt alert. Pink, warm and dry. No dizziness.    Lenore Manner, RN  12/20/14 1034

## 2014-12-20 NOTE — ED Notes (Signed)
Pt given ice water per request, will continue to monitor.     Artemio Aly  12/20/14 1135

## 2014-12-20 NOTE — Telephone Encounter (Signed)
Called myrbetriq to pharmacy.

## 2014-12-20 NOTE — ED Provider Notes (Signed)
[facility]  eMERGENCY dEPARTMENT eNCOUnter          [place]       Chief Complaint   Patient presents with   ??? Diarrhea   ??? Nausea   ??? Abdominal Pain     epigastric       Nurses Notes reviewed and I agree except as noted in the HPI.      HISTORY OF PRESENT ILLNESS    Bradley Oconnell is a 59 y.o. male   HPI Comments: Patient went to a cookout one day ago and reports eating some under cooked chicken. He has had an abdominal hernia repair. Patient recently began taking Myrbetriq for his overactive bladder.     Patient is a 59 y.o. male presenting with nausea. The history is provided by the patient.   Nausea & Vomiting  Severity:  Mild  Duration:  2 days  Timing:  Intermittent  Number of daily episodes:  2  Quality:  Stomach contents and bilious material  Able to tolerate:  Liquids  Progression:  Unchanged  Relieved by:  Nothing  Ineffective treatments:  Antiemetics (Peptobismol )  Associated symptoms: abdominal pain, chills and diarrhea    Associated symptoms: no headaches and no sore throat    Abdominal pain:     Location:  Epigastric    Quality:  Burning    Severity:  Mild    Onset quality:  Sudden    Duration:  2 days    Timing:  Intermittent    Progression:  Unchanged  Diarrhea:     Quality:  Watery    Severity:  Moderate    Duration:  2 days    Timing:  Intermittent    Progression:  Unchanged  Risk factors: suspect food intake        REVIEW OF SYSTEMS     Review of Systems   Constitutional: Positive for chills. Negative for fever.   HENT: Negative for congestion, rhinorrhea and sore throat.    Eyes: Negative for visual disturbance.   Respiratory: Negative for cough, chest tightness and shortness of breath.    Cardiovascular: Negative for chest pain and leg swelling.   Gastrointestinal: Positive for nausea, abdominal pain and diarrhea. Negative for vomiting.   Endocrine: Negative for cold intolerance and heat intolerance.   Genitourinary: Negative for dysuria.   Skin: Negative for rash.    Neurological: Negative for dizziness, numbness and headaches.   Psychiatric/Behavioral: Negative for confusion and dysphoric mood.        PAST MEDICAL HISTORY    has a past medical history of Depression, recurrent ([place]); ED (erectile dysfunction); OA (osteoarthritis); Testosterone deficiency; Genital warts; and Cervical pain (neck).    SURGICAL HISTORY      has past surgical history that includes Foot surgery (Bilateral); hernia repair (2010); Hemorrhoid surgery (2006); Rotator cuff repair (Right); Colonoscopy; Upper gastrointestinal endoscopy (04/15/13); and Foot surgery (Left, 07/19/2014).    CURRENT MEDICATIONS       Discharge Medication List as of 12/20/2014  2:38 PM      CONTINUE these medications which have NOT CHANGED    Details   !! Mirabegron ER (MYRBETRIQ) 25 MG TB24 Take 1 tablet by mouth daily, Disp-30 tablet, R-3      !! Mirabegron ER (MYRBETRIQ) 25 MG TB24 Take 1 tablet by mouth daily, Disp-30 tablet, R-3      tolterodine (DETROL LA) 4 MG ER capsule Take 1 capsule by mouth daily, Disp-30 capsule, R-0  celecoxib (CELEBREX) 200 MG capsule Take 1 capsule by mouth daily, Disp-90 capsule, R-1      calcium carbonate (OSCAL) 500 MG TABS tablet Take 500 mg by mouth daily Per dr Dulce Sellar for bone healing      Ascorbic Acid (VITAMIN C) 500 MG tablet Take 500 mg by mouth daily Per dr Dulce Sellar for bone healing      magnesium oxide (MAG-OX) 400 MG tablet Take 400 mg by mouth daily Per dr Dulce Sellar for bone healing      Vitamin D (CHOLECALCIFEROL) 1000 UNITS CAPS capsule Take 1,000 Units by mouth daily Per dr Dulce Sellar for bone healing      Omega-3 Fatty Acids (FISH OIL PO) Take 1 tablet by mouth daily      tranylcypromine (PARNATE) 10 MG tablet Take 1 tablet by mouth 3 times daily, Disp-270 tablet, R-3      topiramate (TOPAMAX) 100 MG tablet Take 1 tablet by mouth 3 times daily, Disp-270 tablet, R-3      finasteride (PROSCAR) 5 MG tablet Take 1 tablet by mouth daily, Disp-90 tablet, R-3       !! - Potential duplicate  medications found. Please discuss with provider.          ALLERGIES     has No Known Allergies.    FAMILY HISTORY     indicated that his mother is deceased. He indicated that his father is deceased.  family history includes Arthritis in his father and mother; Cancer in his father; Depression in his father and son; Early Death in his son; Heart Disease in his father; Parkinsonism in his mother; Stroke in his father. There is no history of Asthma, Birth Defects, Diabetes, Hearing Loss, High Blood Pressure, High Cholesterol, Kidney Disease, Learning Disabilities, Mental Illness, Mental Retardation, Miscarriages / Stillbirths, Substance Abuse, Vision Loss, or Other.    SOCIAL HISTORY      reports that he quit smoking about 3 years ago. He has never used smokeless tobacco. He reports that he drinks alcohol. He reports that he does not use illicit drugs.    PHYSICAL EXAM     INITIAL VITALS:  height is 5\' 7"  (1.702 m) and weight is 195 lb (88.451 kg). His oral temperature is 98.6 ??F (37 ??C). His blood pressure is 131/61 and his pulse is 86. His respiration is 16 and oxygen saturation is 97%.    Physical Exam   Constitutional: He is oriented to person, place, and time. He appears well-developed and well-nourished. No distress.   HENT:   Head: Normocephalic and atraumatic.   Mouth/Throat: Oropharynx is clear and moist.   Eyes: EOM are normal. Pupils are equal, round, and reactive to light.   Neck: Normal range of motion. Neck supple.   Cardiovascular: Normal rate, regular rhythm and normal heart sounds.  Exam reveals no gallop and no friction rub.    No murmur heard.  Heart score of 2   Pulmonary/Chest: Effort normal and breath sounds normal. No respiratory distress. He has no wheezes. He has no rales.   Abdominal: Soft. Bowel sounds are normal. He exhibits no distension. There is tenderness (mild) in the epigastric area.   Musculoskeletal: He exhibits no edema.   Lymphadenopathy:     He has no cervical adenopathy.    Neurological: He is alert and oriented to person, place, and time.   Skin: Skin is warm and dry. He is not diaphoretic.   Psychiatric: He has a normal mood and affect.   Nursing note  and vitals reviewed.      DIFFERENTIAL DIAGNOSIS:   Including but not limited to:    Gastroenteritis, pancreatitis, food bourne illness, doubt cardiac     DIAGNOSTIC RESULTS     EKG: All EKG's are interpreted by the Emergency Department Physician who either signs or Co-signs this chart in the absence of a cardiologist.    EKG read and interpreted by myself with comparison to June 07, 2014 gives impression of normal sinus rhythm with heart rate of 76; interval 172; QRS 92;QTc 438; axis 49. No STEMI.   Normal EKG    RADIOLOGY: non-plain film images(s) such as CT, Ultrasound and MRI are read by the radiologist.    No orders to display       LABS:   Labs Reviewed   BASIC METABOLIC PANEL - Abnormal; Notable for the following:     CO2 19 (*)     All other components within normal limits   CBC WITH AUTO DIFFERENTIAL - Abnormal; Notable for the following:     Lymphocytes Absolute 0.3 (*)     All other components within normal limits   URINE RT REFLEX TO CULTURE - Abnormal; Notable for the following:     Ketones, Urine TRACE (*)     All other components within normal limits   GLOMERULAR FILTRATION RATE, ESTIMATED - Abnormal; Notable for the following:     Est, Glom Filt Rate 86 (*)     All other components within normal limits   HEPATIC FUNCTION PANEL   LIPASE   AMYLASE   TROPONIN   OSMOLALITY   ANION GAP       EMERGENCY DEPARTMENT COURSE:   Vitals:    Filed Vitals:    12/20/14 1032 12/20/14 1251   BP: 128/76 131/61   Pulse: 88 86   Temp: 98.6 ??F (37 ??C)    TempSrc: Oral    Resp: 16 16   Height: 5\' 7"  (1.702 m)    Weight: 195 lb (88.451 kg)    SpO2: 96% 97%     Patient with epigastric pain, vomiting and diarrhea after eating suspicious food yesterday.  No chest pain, no SOB, after workup his heart score is 2. I am not suspicious for  cardiac at this time.  Feels better after zofran.  Tolerating PO w/o difficulty.  He is a little dehydrated but his labs are otherwise normal.  He feels well to go home.  I suspect this is likely a simpel gastroenteritis that may be resolving it self now.  Zofran and protonix prescribed for home.  To folllow up with PCP if not better tomorrow.    CRITICAL CARE:   none    CONSULTS:  none    PROCEDURES:  none    FINAL IMPRESSION      1. Vomiting and diarrhea    2. Abdominal pain, epigastric          DISPOSITION/PLAN   The patient was discharged home in stable condition.      PATIENT REFERRED TO:  Sheila Oats, MD  [address]  [place] [address]  [phone]    Call in 1 day  to report how you are feeling and to schedule follow up as needed.    the ER    Go to  for worsening pain, persistent vomiting, chest pain, difficulty breathing, fever or any other new or concerning symptoms.      DISCHARGE MEDICATIONS:  Discharge Medication List as of 12/20/2014  2:38 PM      START taking these medications    Details   ondansetron (ZOFRAN ODT) 4 MG disintegrating tablet Take 1 tablet by mouth 3 times daily as needed for Nausea or Vomiting, Disp-12 tablet, R-0      pantoprazole (PROTONIX) 40 MG tablet Take 1 tablet by mouth daily, Disp-30 tablet, R-0             (Please note that portions of this note were completed with a voice recognition program.  Efforts were made to edit the dictations but occasionally words are mis-transcribed.)    Scribe:  Clearence Cheek 12/20/14 2:17 PM Scribing for and in the presence of Nicky Pugh, PA-C.    Provider:  I personally performed the services described in the documentation, reviewed and edited the documentation which was dictated to the scribe in my presence, and it accurately records my words and actions.    Nicky Pugh, PA-C 12/20/14 4:20 PM        Nicky Pugh, PA-C  12/20/14 1622

## 2014-12-20 NOTE — ED Notes (Signed)
Urine collected and sent to lab, will continue to monitor.     Bradley Oconnell  12/20/14 1108

## 2014-12-22 LAB — EKG 12-LEAD
Atrial Rate: 76 {beats}/min
P Axis: 58 degrees
P-R Interval: 172 ms
Q-T Interval: 390 ms
QRS Duration: 92 ms
QTc Calculation (Bazett): 438 ms
R Axis: 49 degrees
T Axis: 39 degrees
Ventricular Rate: 76 {beats}/min

## 2014-12-27 NOTE — Telephone Encounter (Signed)
Per Romie Minus at [facility] this pt has already picked up his Myrbetriq, I called to do a PA but the insurance stated the pt was not in the system they would have to put him in. No PA needed.

## 2015-01-02 ENCOUNTER — Encounter

## 2015-01-02 MED ORDER — CELECOXIB 200 MG PO CAPS
200 MG | ORAL_CAPSULE | Freq: Every day | ORAL | Status: DC
Start: 2015-01-02 — End: 2015-03-02

## 2015-01-02 NOTE — Telephone Encounter (Signed)
ep'ed celebrex to pharm requested    What is rheum for.  Normal RF factor, need a reason to refer.

## 2015-01-02 NOTE — Telephone Encounter (Signed)
Patient called to request a Rx for his Celebrex.  Would like it sent to [facility].  Is also requesting a referral to a rheumatologist.  Would like to see Dr Sherrell Puller with [facility].  He called to make an appt himself but they are requesting a referral from his family Dr.  Dr Ramsey's phone # is [phone]

## 2015-01-04 NOTE — Addendum Note (Signed)
Addended by: Lavona Mound A on: 01/04/2015 03:26 PM     Modules accepted: Level of Service

## 2015-01-05 NOTE — Telephone Encounter (Signed)
Spoke with pt and he states he's still having OA, bilateral leg edema and generalized pain.    Spoke with Dr Ramsey's office and they need the referral, office notes, and labs faxed to them.  The pt wanted to call and make his appt.  Information faxed.

## 2015-02-09 MED ORDER — MIRABEGRON ER 25 MG PO TB24
25 MG | ORAL_TABLET | Freq: Every day | ORAL | 3 refills | Status: DC
Start: 2015-02-09 — End: 2015-03-02

## 2015-02-09 NOTE — Telephone Encounter (Signed)
Myrbetriq sent to [facility].

## 2015-02-09 NOTE — Telephone Encounter (Signed)
Pt called asking for a refill on hs Myrbetriq, he would like a 90 day supply. I have [facility] in there, he last seen Dr Lorel Monaco in May 2016. Thank you

## 2015-02-16 NOTE — Telephone Encounter (Signed)
Script clarification faxed to [facility] , please see attached.

## 2015-02-24 ENCOUNTER — Encounter

## 2015-02-24 NOTE — Telephone Encounter (Signed)
Pt is requesting medication refills, he is due for annual appointment. Last annual 02/14/2014. Please call and schedule, verify Pt has enough medication.

## 2015-02-24 NOTE — Telephone Encounter (Signed)
Called/spoke w/ pt Bradley Oconnell:  Made annual appt for 03/02/15.  Pt says he has enough of all meds to get thru w/ no missed doses.

## 2015-02-27 NOTE — Telephone Encounter (Signed)
Pt. Called and was here on March 15th to discuss his epidural but now needs his meds refilled he would like to know does he have to come in for annual or can his meds be refilled without.  We have him scheduled this Thursday for annual. Please call him if he doesn't have to keep appt.

## 2015-02-27 NOTE — Telephone Encounter (Signed)
Med refills is a wellness exam, keep appt.

## 2015-02-27 NOTE — Telephone Encounter (Signed)
Called pt. And told to keep appt.

## 2015-03-02 ENCOUNTER — Ambulatory Visit: Admit: 2015-03-02 | Discharge: 2015-03-02 | Payer: BLUE CROSS/BLUE SHIELD | Attending: Family Medicine

## 2015-03-02 DIAGNOSIS — Z0000 Encounter for general adult medical examination without abnormal findings: Secondary | ICD-10-CM

## 2015-03-02 MED ORDER — TRANYLCYPROMINE SULFATE 10 MG PO TABS
10 MG | ORAL_TABLET | Freq: Three times a day (TID) | ORAL | 3 refills | Status: DC
Start: 2015-03-02 — End: 2015-10-10

## 2015-03-02 MED ORDER — MODAFINIL 200 MG PO TABS
200 MG | ORAL_TABLET | Freq: Every day | ORAL | 3 refills | Status: AC
Start: 2015-03-02 — End: 2015-04-01

## 2015-03-02 MED ORDER — TOPIRAMATE 100 MG PO TABS
100 MG | ORAL_TABLET | Freq: Three times a day (TID) | ORAL | 3 refills | Status: DC
Start: 2015-03-02 — End: 2015-10-10

## 2015-03-02 MED ORDER — MIRABEGRON ER 25 MG PO TB24
25 MG | ORAL_TABLET | Freq: Every day | ORAL | 3 refills | Status: DC
Start: 2015-03-02 — End: 2015-05-03

## 2015-03-02 MED ORDER — DOXYCYCLINE HYCLATE 100 MG PO TABS
100 MG | ORAL_TABLET | Freq: Two times a day (BID) | ORAL | 3 refills | Status: DC
Start: 2015-03-02 — End: 2015-10-10

## 2015-03-02 MED ORDER — FINASTERIDE 5 MG PO TABS
5 MG | ORAL_TABLET | Freq: Every day | ORAL | 3 refills | Status: DC
Start: 2015-03-02 — End: 2015-10-10

## 2015-03-02 MED ORDER — ETODOLAC 500 MG PO TABS
500 MG | ORAL_TABLET | Freq: Two times a day (BID) | ORAL | 3 refills | Status: DC
Start: 2015-03-02 — End: 2015-04-13

## 2015-03-02 NOTE — Patient Instructions (Signed)
Well Visit, Men 50 to 65: Care Instructions  Your Care Instructions  Physical exams can help you stay healthy. Your doctor has checked your overall health and may have suggested ways to take good care of yourself. He or she also may have recommended tests. At home, you can help prevent illness with healthy eating, regular exercise, and other steps.  Follow-up care is a key part of your treatment and safety. Be sure to make and go to all appointments, and call your doctor if you are having problems. It's also a good idea to know your test results and keep a list of the medicines you take.  How can you care for yourself at home?  ?? Reach and stay at a healthy weight. This will lower your risk for many problems, such as obesity, diabetes, heart disease, and high blood pressure.  ?? Get at least 30 minutes of exercise on most days of the week. Walking is a good choice. You also may want to do other activities, such as running, swimming, cycling, or playing tennis or team sports.  ?? Do not smoke. Smoking can make health problems worse. If you need help quitting, talk to your doctor about stop-smoking programs and medicines. These can increase your chances of quitting for good.  ?? Always wear sunscreen on exposed skin. Make sure to use a broad-spectrum sunscreen that has a sun protection factor (SPF) of 30 or higher. Use it every day, even when it is cloudy.  ?? See a dentist one or two times a year for checkups and to have your teeth cleaned.  ?? Wear a seat belt in the car.  ?? Limit alcohol to 2 drinks a day. Too much alcohol can cause health problems.  Follow your doctor's advice about when to have certain tests. These tests can spot problems early.  ?? Cholesterol. Your doctor will tell you how often to have this done based on your overall health and other things that can increase your risk for heart attack and stroke.  ?? Blood pressure. You will likely have your blood pressure checked at any visit to your doctor. If  you are healthy and have a blood pressure below 120/80 mm Hg, have your blood pressure checked at least every 1 to 2 years. This can be done during a routine doctor visit. If you have slightly higher or high blood pressure, or are at risk for heart disease, your doctor will suggest more frequent tests.  ?? Prostate exam. Talk to your doctor about whether you should have a blood test (called a PSA test) for prostate cancer. Experts disagree on whether men should have this test. Some experts recommend that you discuss the benefits and risks of the test with your doctor.  ?? Diabetes. Ask your doctor whether you should have tests for diabetes.  ?? Vision. Some experts recommend that you have yearly exams for glaucoma and other age-related eye problems starting at age 50.  ?? Hearing. Tell your doctor if you notice any change in your hearing. You can have tests to find out how well you hear.  ?? Colon cancer. You should begin tests for colon cancer at age 50. You may have one of several tests. Your doctor will tell you how often to have tests based on your age and risk. Risks include whether you already had a precancerous polyp removed from your colon or whether your parent, brother, sister, or child has had colon cancer.  ?? Heart attack and stroke risk.   At least every 4 to 6 years, you should have your risk for heart attack and stroke assessed. Your doctor uses factors such as your age, blood pressure, cholesterol, and whether you smoke or have diabetes to show what your risk for a heart attack or stroke is over the next 10 years.  ?? Abdominal aortic aneurysm. Ask your doctor whether you should have a test to check for an aneurysm. You may need a test if you ever smoked or if your parent, brother, sister, or child has had an aneurysm.  When should you call for help?  Watch closely for changes in your health, and be sure to contact your doctor if you have any problems or symptoms that concern you.   Where can you learn more?    Go to [email or web address] and sign in to your MyChart account. Enter [phone] in the [place] box to learn more about ???Well Visit, Men 61 to 88: Care Instructions.???    If you do not have an account, please click on the ???Sign Up Now??? link.   ?? 2006-2016 Healthwise, Incorporated. Care instructions adapted under license by [facility]. This care instruction is for use with your licensed healthcare professional. If you have questions about a medical condition or this instruction, always ask your healthcare professional. [place] any warranty or liability for your use of this information.  Content Version: 10.8.513193; Current as of: September 10, 2013        Depression Treatment: Care Instructions  Your Care Instructions  Depression is a condition that affects the way you feel, think, and act. It causes symptoms such as low energy, loss of interest in daily activities, and sadness or grouchiness that goes on for a long time. Depression is very common and affects men and women of all ages.  Depression is a medical illness caused by changes in the natural chemicals in your brain. It is not a character flaw, and it does not mean that you are a bad or weak person. It does not mean that you are going crazy.  It is important to know that depression can be treated. Medicines, counseling, and self-care can all help. Many people do not get help because they are embarrassed or think that they will get over the depression on their own. But some people do not get better without treatment.  Follow-up care is a key part of your treatment and safety. Be sure to make and go to all appointments, and call your doctor if you are having problems. It's also a good idea to know your test results and keep a list of the medicines you take.  How can you care for yourself at home?  Learn about antidepressant medicines  Antidepressant medicines can improve or end the symptoms of  depression. You may need to take the medicine for at least 6 months, and often longer. Keep taking your medicine even if you feel better. If you stop taking it too soon, your symptoms may come back or get worse.  You may start to feel better within 1 to 3 weeks of taking antidepressant medicine. But it can take as many as 6 to 8 weeks to see more improvement. Talk to your doctor if you have problems with your medicine or if you do not notice any improvement after 3 weeks.  Antidepressants can make you feel tired, dizzy, or nervous. Some people have dry mouth, constipation, headaches, sexual problems, an upset stomach, or diarrhea. Many of these side  effects are mild and go away on their own after you take the medicine for a few weeks. Some may last longer. Talk to your doctor if side effects bother you too much. You might be able to try a different medicine. If you are pregnant or breastfeeding, talk to your doctor about what medicines you can take.  Learn about counseling  In many cases, counseling can work as well as medicines to treat mild to moderate depression. Counseling is done by licensed mental health providers, such as psychologists, social workers, and some types of nurses. It can be done in one-on-one sessions or in a group setting. Many people find group sessions helpful.  Cognitive-behavioral therapy is a type of counseling. In this treatment therapy, you learn how to see and change unhelpful thinking styles that may be adding to your depression. Counseling and medicines often work well when used together.  To manage depression  ?? Be physically active. Getting 30 minutes of exercise each day is good for your body and your mind. Begin slowly if it is hard for you to get started. If you already exercise, keep it up.  ?? Plan something pleasant for yourself every day. Include activities that you have enjoyed in the past.  ?? Get enough sleep. Talk to your doctor if you have problems sleeping.  ?? Eat a  balanced diet. If you do not feel hungry, eat small snacks rather than large meals.  ?? Do not drink alcohol, use illegal drugs, or take medicines that your doctor has not prescribed for you. They may interfere with your treatment.  ?? Spend time with family and friends. It may help to speak openly about your depression with people you trust.  ?? Take your medicines exactly as prescribed. Call your doctor if you think you are having a problem with your medicine.  ?? Do not make major life decisions while you are depressed. Depression may change the way you think. You will be able to make better decisions after you feel better.  ?? Think positively. Challenge negative thoughts with statements such as "I am hopeful"; "Things will get better"; and "I can ask for the help I need." Write down these statements and read them often, even if you don't believe them yet.  ?? Be patient with yourself. It took time for your depression to develop, and it will take time for your symptoms to improve. Do not take on too much or be too hard on yourself.  ?? Learn all you can about depression from written and online materials.  ?? Check out behavioral health classes to learn more about dealing with depression.  ?? Keep the numbers for these national suicide hotlines: 1-800-273-TALK (253)828-3119) and 1-800-SUICIDE 831-349-4658). If you or someone you know talks about suicide or feeling hopeless, get help right away.  When should you call for help?  Call 911 anytime you think you may need emergency care. For example, call if:  ?? You feel you cannot stop from hurting yourself or someone else.  Call your doctor now or seek immediate medical care if:  ?? You hear voices.  ?? You feel much more depressed.  Watch closely for changes in your health, and be sure to contact your doctor if:  ?? You are having problems with your depression medicine.  ?? You are not getting better as expected.   Where can you learn more?   Go to  [email or web address] and sign in to your MyChart account. Enter [phone] in the  [facility] Information box to learn more about ???Depression Treatment: Care Instructions.???    If you do not have an account, please click on the ???Sign Up Now??? link.   ?? 2006-2016 Healthwise, Inc[employer]. Care instructions adapted under license by [facility]. This care instruction is for use with your licensed healthcare professional. If you have questions about a medical condition or this instruction, always ask your healthcare professional. [place] any warranty or liability for your use of this information.  Content Version: 10.8.513193; Current as of: June 10, 2014        DASH Diet: Care Instructions  Your Care Instructions  The DASH diet is an eating plan that can help lower your blood pressure. DASH stands for Dietary Approaches to Stop Hypertension. Hypertension is high blood pressure.  The DASH diet focuses on eating foods that are high in calcium, potassium, and magnesium. These nutrients can lower blood pressure. The foods that are highest in these nutrients are fruits, vegetables, low-fat dairy products, nuts, seeds, and legumes. But taking calcium, potassium, and magnesium supplements instead of eating foods that are high in those nutrients does not have the same effect. The DASH diet also includes whole grains, fish, and poultry.  The DASH diet is one of several lifestyle changes your doctor may recommend to lower your high blood pressure. Your doctor may also want you to decrease the amount of sodium in your diet. Lowering sodium while following the DASH diet can lower blood pressure even further than just the DASH diet alone.  Follow-up care is a key part of your treatment and safety. Be sure to make and go to all appointments, and call your doctor if you are having problems. It's also a good idea to know your test results and keep a list of the medicines you take.  How can  you care for yourself at home?  Following the DASH diet  ?? Eat 4 to 5 servings of fruit each day. A serving is 1 medium-sized piece of fruit, ?? cup chopped or canned fruit, 1/4 cup dried fruit, or 4 ounces (?? cup) of fruit juice. Choose fruit more often than fruit juice.  ?? Eat 4 to 5 servings of vegetables each day. A serving is 1 cup of lettuce or raw leafy vegetables, ?? cup of chopped or cooked vegetables, or 4 ounces (?? cup) of vegetable juice. Choose vegetables more often than vegetable juice.  ?? Get 2 to 3 servings of low-fat and fat-free dairy each day. A serving is 8 ounces of milk, 1 cup of yogurt, or 1 ?? ounces of cheese.  ?? Eat 6 to 8 servings of grains each day. A serving is 1 slice of bread, 1 ounce of dry cereal, or ?? cup of cooked rice, pasta, or cooked cereal. Try to choose whole-grain products as much as possible.  ?? Limit lean meat, poultry, and fish to 2 servings each day. A serving is 3 ounces, about the size of a deck of cards.  ?? Eat 4 to 5 servings of nuts, seeds, and legumes (cooked dried beans, lentils, and split peas) each week. A serving is 1/3 cup of nuts, 2 tablespoons of seeds, or ?? cup of cooked beans or peas.  ?? Limit fats and oils to 2 to 3 servings each day. A serving is 1 teaspoon of vegetable oil or 2 tablespoons of salad dressing.  ?? Limit sweets and added sugars to 5 servings or less a week. A serving is 1  tablespoon jelly or jam, ?? cup sorbet, or 1 cup of lemonade.  ?? Eat less than 2,300 milligrams (mg) of sodium a day. If you have high blood pressure, diabetes, or chronic kidney disease, if you are African-American, or if you are older than age 68, try to limit the amount of sodium you eat to less than 1,500 mg a day.  Tips for success  ?? Start small. Do not try to make dramatic changes to your diet all at once. You might feel that you are missing out on your favorite foods and then be more likely to not follow the plan. Make small changes, and stick with them. Once those  changes become habit, add a few more changes.  ?? Try some of the following:  ?? Make it a goal to eat a fruit or vegetable at every meal and at snacks. This will make it easy to get the recommended amount of fruits and vegetables each day.  ?? Try yogurt topped with fruit and nuts for a snack or healthy dessert.  ?? Add lettuce, tomato, cucumber, and onion to sandwiches.  ?? Combine a ready-made pizza crust with low-fat mozzarella cheese and lots of vegetable toppings. Try using tomatoes, squash, spinach, broccoli, carrots, cauliflower, and onions.  ?? Have a variety of cut-up vegetables with a low-fat dip as an appetizer instead of chips and dip.  ?? Sprinkle sunflower seeds or chopped almonds over salads. Or try adding chopped walnuts or almonds to cooked vegetables.  ?? Try some vegetarian meals using beans and peas. Add garbanzo or kidney beans to salads. Make burritos and tacos with mashed pinto beans or black beans.   Where can you learn more?   Go to [email or web address] and sign in to your MyChart account. Enter [phone] in the [place] box to learn more about ???DASH Diet: Care Instructions.???    If you do not have an account, please click on the ???Sign Up Now??? link.   ?? 2006-2016 Healthwise, Incorporated. Care instructions adapted under license by [facility]. This care instruction is for use with your licensed healthcare professional. If you have questions about a medical condition or this instruction, always ask your healthcare professional. [place] any warranty or liability for your use of this information.  Content Version: 10.8.513193; Current as of: September 10, 2013

## 2015-03-02 NOTE — Progress Notes (Signed)
Subjective:      Patient ID: EULOGIO REQUENA is a 59 y.o. male.    HPI  Pt here for annual wellness exam and med refills.  Needs bloodwork orders.  Reviewed BMI of 31.  Encouraged diet, exercise and weight loss.  Reviewed health maintenance, colonoscopy up to date.  Denies any current problems, is feeling well.  Married, former smoker,pmh reviewed.    Review of Systems   Constitutional: Negative for chills, fatigue, fever and unexpected weight change.   HENT: Negative for congestion, ear pain, rhinorrhea and sore throat.    Eyes: Negative for pain and visual disturbance.   Respiratory: Negative for cough, chest tightness, shortness of breath and wheezing.    Cardiovascular: Negative for chest pain and palpitations.   Gastrointestinal: Negative for abdominal pain, blood in stool, constipation, diarrhea, nausea and vomiting.   Genitourinary: Negative for difficulty urinating, frequency, hematuria and urgency.   Musculoskeletal: Negative for back pain, joint swelling, myalgias and neck pain.   Skin: Negative for rash.   Neurological: Negative for dizziness and headaches.   Hematological: Negative for adenopathy. Does not bruise/bleed easily.   Psychiatric/Behavioral: Negative for behavioral problems and sleep disturbance. The patient is not nervous/anxious.        Objective:   Physical Exam   Constitutional: He is oriented to person, place, and time. He appears well-developed and well-nourished.   HENT:   Head: Normocephalic and atraumatic.   Right Ear: External ear normal.   Left Ear: External ear normal.   Nose: Nose normal.   Mouth/Throat: Oropharynx is clear and moist.   Eyes: EOM are normal. Pupils are equal, round, and reactive to light.   Neck: Neck supple. Carotid bruit is not present. No thyromegaly present.   Cardiovascular: Normal rate, regular rhythm and normal heart sounds.    Pulmonary/Chest: Breath sounds normal. He has no wheezes. He has no rales.   Abdominal: Soft. Bowel sounds are normal. There is no  tenderness. There is no rebound and no guarding.   Musculoskeletal: Normal range of motion.        Right lower leg: He exhibits edema.        Left lower leg: He exhibits edema.   1+ pitting edema bilaterally   Lymphadenopathy:     He has no cervical adenopathy.   Neurological: He is alert and oriented to person, place, and time. He has normal reflexes. No cranial nerve deficit.   Skin: Skin is warm and dry. No rash noted.   Psychiatric: He has a normal mood and affect.   Nursing note and vitals reviewed.      Health Maintenance   Topic Date Due   ??? HEPATITIS  C SCREENING  06/09/1956   ??? Tetanus (DTaP/Tdap/Td) (1 - Tdap) 07/05/1973   ??? DIABETES SCREENING  07/05/1994   ??? ZOSTAVAX VACCINE  07/05/2014   ??? Flu Vaccine (1) 02/20/2015   ??? LIPIDS  05/22/2017   ??? COLON CANCER SCREENING COLONOSCOPY  03/30/2023   ??? HIV SCREENING  Completed       Assessment:      59 year old well exam      Plan:      Refill meds  Psa  Encouraged diet, exercise and weight loss.  Dash diet  Reviewed health maintenance    Landin received counseling on the following healthy behaviors: nutrition, exercise and medication adherence    Patient given educational materials on Nutrition and Exercise    I have instructed Aikeem to complete  a self tracking handout on We[employer] and instructed them to bring it with them to his next appointment.     Discussed use, benefit, and side effects of prescribed medications.  Barriers to medication compliance addressed.  All patient questions answered.  Pt voiced understanding.

## 2015-03-10 NOTE — Telephone Encounter (Signed)
Pt called after receiving the denial letter for modafinil. He is requesting we add diagnosis to this request and fax back to [facility].  Please advise,letter attached.

## 2015-03-10 NOTE — Telephone Encounter (Signed)
Left message on machine for call back.

## 2015-03-10 NOTE — Telephone Encounter (Signed)
What specific diagnosis does he think he has.  We need testing documentation to verify.

## 2015-03-21 ENCOUNTER — Inpatient Hospital Stay: Admit: 2015-03-21 | Discharge: 2015-03-21 | Disposition: A | Discharge status: Home / Self Care

## 2015-03-21 ENCOUNTER — Telehealth

## 2015-03-21 LAB — CBC WITH AUTO DIFFERENTIAL
Basophils Absolute: 0 10*3/uL (ref 0.0–0.1)
Basophils: 0.9 %
Eosinophils Absolute: 0.3 10*3/uL (ref 0.0–0.4)
Eosinophils: 6.5 %
Hematocrit: 41.1 % — ABNORMAL LOW (ref 42.0–52.0)
Hemoglobin: 13.6 gm/dl — ABNORMAL LOW (ref 14.0–18.0)
Lymphocytes Absolute: 1.2 10*3/uL (ref 1.0–4.8)
Lymphocytes: 25.4 %
MCH: 30.8 pg (ref 27.0–31.0)
MCHC: 33.1 gm/dl (ref 33.0–37.0)
MCV: 92.9 fL (ref 80.0–94.0)
MPV: 8.1 mcm (ref 7.4–10.4)
Monocytes Absolute: 0.5 10*3/uL (ref 0.4–1.3)
Monocytes: 10.4 %
Platelets: 163 10*3/uL (ref 130–400)
RBC Morphology: NORMAL
RBC: 4.42 10*6/uL — ABNORMAL LOW (ref 4.70–6.10)
RDW: 13.5 % (ref 11.5–14.5)
Seg Neutrophils: 56.8 %
Segs Absolute: 2.8 10*3/uL (ref 1.8–7.7)
WBC: 4.9 10*3/uL (ref 4.8–10.8)
nRBC: 0 /100 wbc

## 2015-03-21 LAB — COMPREHENSIVE METABOLIC PANEL
ALT: 21 U/L (ref 11–66)
AST: 29 U/L (ref 5–40)
Albumin: 4.4 gm/dl (ref 3.5–5.1)
Alkaline Phosphatase: 68 U/L (ref 38–126)
BUN: 19 mg/dl (ref 7–22)
CO2: 21 meq/l — ABNORMAL LOW (ref 23–33)
Calcium: 9 mg/dl (ref 8.5–10.5)
Chloride: 103 meq/l (ref 98–111)
Creatinine: 1 mg/dl (ref 0.4–1.2)
Glucose: 91 mg/dl (ref 70–108)
Potassium: 4.2 meq/l (ref 3.5–5.2)
Sodium: 140 meq/l (ref 135–145)
Total Bilirubin: 0.3 mg/dl (ref 0.3–1.2)
Total Protein: 6.8 gm/dl (ref 6.1–8.0)

## 2015-03-21 LAB — CK: Total CK: 256 U/L — ABNORMAL HIGH (ref 55–170)

## 2015-03-21 LAB — SEDIMENTATION RATE: Sed Rate: 3 mm/hr (ref 0–10)

## 2015-03-21 LAB — ANION GAP: Anion Gap: 16 meq/l (ref 8.0–16.0)

## 2015-03-21 LAB — URIC ACID: Uric Acid: 3.5 mg/dl — ABNORMAL LOW (ref 3.7–7.0)

## 2015-03-21 LAB — GLOMERULAR FILTRATION RATE, ESTIMATED: Est, Glom Filt Rate: 76 mL/min/{1.73_m2} — AB

## 2015-03-21 LAB — PSA PROSTATIC SPECIFIC ANTIGEN: PSA: 0.27 ng/ml (ref 0.00–2.50)

## 2015-03-21 LAB — MRSA BY PCR: MRSA SCREEN RT-PCR: NEGATIVE

## 2015-03-21 NOTE — Telephone Encounter (Signed)
His ekg is normal.  Would need to be having chest pain or SOB with exertion to justify a stress test.  Family history is not enough.

## 2015-03-21 NOTE — Telephone Encounter (Signed)
T/C Bradley Oconnell - [phone] - states he is going to have rotator surgery in November.  He sat up pre op visit in October.  He has had his EKG done, but he is wondering if he might get a stress test done since his father had congestive heart.  Is that something you could order up for him before his visit?

## 2015-03-22 LAB — EKG 12-LEAD
Atrial Rate: 57 {beats}/min
P Axis: 60 degrees
P-R Interval: 180 ms
Q-T Interval: 428 ms
QRS Duration: 94 ms
QTc Calculation (Bazett): 416 ms
R Axis: 47 degrees
T Axis: 29 degrees
Ventricular Rate: 57 {beats}/min

## 2015-03-22 NOTE — Telephone Encounter (Signed)
Edema would indicate an echo, but not a stress test.

## 2015-03-22 NOTE — Telephone Encounter (Signed)
Patient notified that his ekg is normal and that he would need to be having chest pain or SOB with exertion to justify a stress test. Family history is not enough.    Patient states he is concerned about it because of the bilateral 2+ pitting edema.  If you think that this is not of concern with the surgery issue he will keep the pre op appt and proceed  with the surgery.    Please call him back

## 2015-03-22 NOTE — Telephone Encounter (Signed)
Patient notified and would like to have the echo done.  Requests we order up in epic and he will call to schedule the test.   ECHO ordered.

## 2015-03-24 LAB — CYCLIC CITRUL PEPTIDE ANTIBODY, IGG: Cyclic Citrullin Peptide Ab: 4 Units (ref 0–19)

## 2015-04-04 ENCOUNTER — Inpatient Hospital Stay: Admit: 2015-04-04 | Attending: Family Medicine

## 2015-04-04 DIAGNOSIS — R609 Edema, unspecified: Secondary | ICD-10-CM

## 2015-04-04 LAB — ECHOCARDIOGRAM COMPLETE 2D W DOPPLER W COLOR: Left Ventricular Ejection Fraction: 55

## 2015-04-05 NOTE — Telephone Encounter (Signed)
Patient notified of results.

## 2015-04-05 NOTE — Telephone Encounter (Signed)
-----   Message from Sheila Oats, MD sent at 04/04/2015  7:53 PM EDT -----  Notify echo is good.  Normal EF, no wall abnormalities.

## 2015-04-06 ENCOUNTER — Encounter

## 2015-04-11 ENCOUNTER — Inpatient Hospital Stay: Admit: 2015-04-11

## 2015-04-11 ENCOUNTER — Inpatient Hospital Stay: Admit: 2015-04-11 | Attending: Thoracic Surgery (Cardiothoracic Vascular Surgery)

## 2015-04-11 DIAGNOSIS — R600 Localized edema: Secondary | ICD-10-CM

## 2015-04-13 ENCOUNTER — Ambulatory Visit
Admit: 2015-04-13 | Discharge: 2015-04-13 | Payer: BLUE CROSS/BLUE SHIELD | Attending: Thoracic Surgery (Cardiothoracic Vascular Surgery)

## 2015-04-13 DIAGNOSIS — R600 Localized edema: Secondary | ICD-10-CM

## 2015-04-13 NOTE — Progress Notes (Signed)
Office Note      Patient's Name/Date of Birth: Bradley Oconnell / 1955/11/02 (59 y.o.)    Date: April 27, 2015     Dear Dr. Manfred Shirts,       We had the pleasure of seeing Bradley Oconnell in the office today, as you know this is a very pleasant  59 y.o. year old male well know to me who is being seen because of his concerns about bilateral leg edema and cramping in his legs.  He has no history of cardiovascular disease.  He had Echocardiogram and lower extermith arterial and venous dopplers prior to visit.  .     Patient has not had any problems with claudication or nonhealing wounds to the legs in the past.       Chief Complaint   Patient presents with   ??? New Patient     DISCUSS BLE EDEMA DOPPLER [place]     Patient does have a power of attorney documented.  Patient does not have a living will documented.    Past Medical History:  Donn  has a past medical history of Cervical pain (neck); Depression, recurrent [facility]); ED (erectile dysfunction); Genital warts; OA (osteoarthritis); and Testosterone deficiency.    Past Surgical History:  The patient  has a past surgical history that includes Foot surgery (Bilateral); hernia repair (2010); Hemorrhoid surgery (2006); Rotator cuff repair (Right); Colonoscopy; Upper gastrointestinal endoscopy (04/15/13); and Foot surgery (Left, 07/19/2014).    Allergies:  The patient has No Known Allergies.    Medications:    Current Outpatient Prescriptions:   ???  etodolac (LODINE XL) 500 MG extended release tablet, Take 1 tablet by mouth 2 times daily, Disp: , Rfl:   ???  ketorolac (TORADOL) 10 MG tablet, Take 1 tablet by mouth as needed , Disp: , Rfl:   ???  finasteride (PROSCAR) 5 MG tablet, Take 1 tablet by mouth daily, Disp: 90 tablet, Rfl: 3  ???  topiramate (TOPAMAX) 100 MG tablet, Take 1 tablet by mouth 3 times daily, Disp: 270 tablet, Rfl: 3  ???  tranylcypromine (PARNATE) 10 MG tablet, Take 1 tablet by mouth 3 times daily, Disp: 270 tablet, Rfl: 3  ???  Mirabegron ER (MYRBETRIQ)  25 MG TB24, Take 1 tablet by mouth daily, Disp: 90 tablet, Rfl: 3  ???  doxycycline (VIBRA-TABS) 100 MG tablet, Take 1 tablet by mouth 2 times daily, Disp: 180 tablet, Rfl: 3  ???  [DISCONTINUED] oxybutynin (DITROPAN) 5 MG tablet, Take 1 tablet by mouth 2 times daily., Disp: 60 tablet, Rfl: 11    Family History:  This patient's family history includes Arthritis in his father and mother; Cancer in his father; Depression in his father and son; Early Death in his son; Heart Disease in his father; Parkinsonism in his mother; Stroke in his father. There is no history of Asthma, Birth Defects, Diabetes, Hearing Loss, High Blood Pressure, High Cholesterol, Kidney Disease, Learning Disabilities, Mental Illness, Mental Retardation, Miscarriages / Stillbirths, Substance Abuse, Vision Loss, or Other.    Social History:  Bradley Oconnell  reports that he quit smoking about 3 years ago. He has never used smokeless tobacco. He reports that he drinks alcohol. He reports that he does not use illicit drugs.    ROS:   ROS    Physical Exam:  BP 113/80  Pulse 78  Wt 198 lb 9.6 oz (90.1 kg)  SpO2 95%  BMI 31.17 kg/m2    Labs:  CBC:   Lab Results   Component Value Date    WBC 4.9 03/21/2015    WBC 6.2 02/26/2010    HGB 13.6 03/21/2015    HCT 41.1 03/21/2015    MCV 92.9 03/21/2015    PLT 163 03/21/2015     BMP:   Lab Results   Component Value Date    NA 140 03/21/2015    K 4.2 03/21/2015    CL 103 03/21/2015    CO2 21 03/21/2015    BUN 19 03/21/2015    BUN 24 02/26/2010    CREATININE 1.0 03/21/2015    CREATININE 1.1 02/26/2010     PT/INR: No results found for: PROTIME, INR    Physical Exam  Pulses Right Leg:  Femoral: strong palp   Popliteal: strong palp      KV:QQVZDG palp    PT: strong palp                 Left Leg:  Femoral: strong palp   Popliteal: strong palp      LO:VFIEPP palp    PT: strong palp         Assessment:  1. Bilateral leg edema        Bradley Oconnell has ED (erectile dysfunction); Testosterone deficiency; OA (osteoarthritis); Overactive  bladder; Chronic depression; Obesity (BMI 30.0-34.9); and Inguinal hernia, left on his problem list.    Plan 04/27/15:  Patient's vascular exam and echo and arterial and venous dopplers are unremarkable.  I feel that the patient's leg edema is likely occupational; with standing extended period of time and not too much activity.  I do not see any indication for further testing or intervention.    I discussed this with the patient and reassured him that he has no significant vascular or cardiac valvulr disease.    He will not need any follow up with him and no further testing is indicated.    Electronically signed by Cindee Lame, MD on 04/27/2015 at 9:35 AM

## 2015-05-01 ENCOUNTER — Encounter: Attending: Family Medicine

## 2015-05-03 ENCOUNTER — Ambulatory Visit: Admit: 2015-05-03 | Discharge: 2015-05-03 | Payer: BLUE CROSS/BLUE SHIELD | Attending: Family Medicine

## 2015-05-03 DIAGNOSIS — Z01818 Encounter for other preprocedural examination: Secondary | ICD-10-CM

## 2015-05-03 MED ORDER — OXYBUTYNIN CHLORIDE 5 MG PO TABS
5 MG | ORAL_TABLET | Freq: Two times a day (BID) | ORAL | 2 refills | Status: DC
Start: 2015-05-03 — End: 2016-05-27

## 2015-05-03 NOTE — Progress Notes (Signed)
After obtaining consent, and per orders of Dr. Manfred Shirts, injection of Influenza vaccine 0.5 mL IM left deltoid given by Sander Radon. Patient tolerated well.    Rx mailed to Pt.

## 2015-05-03 NOTE — Progress Notes (Signed)
Subjective:      Patient ID: Bradley Oconnell is a 59 y.o. male.    HPI  Pt here for pre-op physical at the request of DR. Misson.  Scheduled to have left shoulder repair on 05/29/2015 at [facility] for left torn rotator cuff.  Reviewed BMI of 31.  Encouraged diet, exercise and weight loss.  Pt has a normal functional capacity.  No stress testing or heart caths in the last 5 years.  No chest pain or SOB.  Partnered, former smoker,pmh reviewed.    Review of Systems   Constitutional: Negative for chills, fatigue, fever and unexpected weight change.   HENT: Negative for congestion, ear pain, rhinorrhea and sore throat.    Eyes: Negative for pain and visual disturbance.   Respiratory: Negative for cough, chest tightness, shortness of breath and wheezing.    Cardiovascular: Negative for chest pain and palpitations.   Gastrointestinal: Negative for abdominal pain, blood in stool, constipation, diarrhea, nausea and vomiting.   Genitourinary: Negative for difficulty urinating, frequency, hematuria and urgency.   Musculoskeletal: Negative for back pain, joint swelling, myalgias and neck pain.        Left shoulder pain   Skin: Negative for rash.   Neurological: Negative for dizziness and headaches.   Hematological: Negative for adenopathy. Does not bruise/bleed easily.   Psychiatric/Behavioral: Negative for behavioral problems and sleep disturbance. The patient is not nervous/anxious.      Past Medical History   Diagnosis Date   ??? Cervical pain (neck)    ??? Depression, recurrent ([place])    ??? ED (erectile dysfunction)    ??? Genital warts    ??? OA (osteoarthritis)    ??? Testosterone deficiency      Past Surgical History   Procedure Laterality Date   ??? Foot surgery Bilateral      x 4   ??? Hernia repair  2010   ??? Hemorrhoid surgery  2006   ??? Rotator cuff repair Right    ??? Colonoscopy       polyps   ??? Upper gastrointestinal endoscopy  04/15/13     DR. Ilsa Iha NO BX.   ??? Foot surgery Left 07/19/2014     No Known Allergies    Current Outpatient  Prescriptions:   ???  etodolac (LODINE XL) 500 MG extended release tablet, Take 1 tablet by mouth 2 times daily, Disp: , Rfl:   ???  ketorolac (TORADOL) 10 MG tablet, Take 1 tablet by mouth as needed , Disp: , Rfl:   ???  finasteride (PROSCAR) 5 MG tablet, Take 1 tablet by mouth daily, Disp: 90 tablet, Rfl: 3  ???  Mirabegron ER (MYRBETRIQ) 25 MG TB24, Take 1 tablet by mouth daily, Disp: 90 tablet, Rfl: 3  ???  doxycycline (VIBRA-TABS) 100 MG tablet, Take 1 tablet by mouth 2 times daily, Disp: 180 tablet, Rfl: 3  ???  topiramate (TOPAMAX) 100 MG tablet, Take 1 tablet by mouth 3 times daily, Disp: 270 tablet, Rfl: 3  ???  tranylcypromine (PARNATE) 10 MG tablet, Take 1 tablet by mouth 3 times daily, Disp: 270 tablet, Rfl: 3  ???  [DISCONTINUED] oxybutynin (DITROPAN) 5 MG tablet, Take 1 tablet by mouth 2 times daily., Disp: 60 tablet, Rfl: 11  Social History     Social History   ??? Marital status: Married     Spouse name: N/A   ??? Number of children: N/A   ??? Years of education: N/A     Occupational History   ???  Not on file.     Social History Main Topics   ??? Smoking status: Former Smoker     Quit date: 06/23/2011   ??? Smokeless tobacco: Never Used   ??? Alcohol use Yes      Comment: social   ??? Drug use: No   ??? Sexual activity: Yes     Other Topics Concern   ??? Not on file     Social History Narrative     Family History   Problem Relation Age of Onset   ??? Parkinsonism Mother    ??? Arthritis Mother    ??? Heart Disease Father      Congestive Heart   ??? Cancer Father    ??? Stroke Father    ??? Arthritis Father    ??? Depression Father    ??? Depression Son    ??? Early Death Son    ??? Asthma Neg Hx    ??? Birth Defects Neg Hx    ??? Diabetes Neg Hx    ??? Hearing Loss Neg Hx    ??? High Blood Pressure Neg Hx    ??? High Cholesterol Neg Hx    ??? Kidney Disease Neg Hx    ??? Learning Disabilities Neg Hx    ??? Mental Illness Neg Hx    ??? Mental Retardation Neg Hx    ??? Miscarriages / Stillbirths Neg Hx    ??? Substance Abuse Neg Hx    ??? Vision Loss Neg Hx    ??? Other Neg Hx         Objective:   Physical Exam   Constitutional: He is oriented to person, place, and time. He appears well-developed and well-nourished.   HENT:   Head: Normocephalic and atraumatic.   Right Ear: External ear normal.   Left Ear: External ear normal.   Nose: Nose normal.   Mouth/Throat: Oropharynx is clear and moist.   Eyes: EOM are normal. Pupils are equal, round, and reactive to light.   Neck: Neck supple. Carotid bruit is not present. No thyromegaly present.   Cardiovascular: Normal rate, regular rhythm and normal heart sounds.    Pulmonary/Chest: Breath sounds normal. He has no wheezes. He has no rales.   Abdominal: Soft. Bowel sounds are normal. There is no tenderness. There is no rebound and no guarding.   Musculoskeletal: He exhibits no edema.        Left shoulder: He exhibits decreased range of motion, pain and decreased strength.   Lymphadenopathy:     He has no cervical adenopathy.   Neurological: He is alert and oriented to person, place, and time. He has normal reflexes. No cranial nerve deficit.   Skin: Skin is warm and dry. No rash noted.   Psychiatric: He has a normal mood and affect.   Nursing note and vitals reviewed.    Vitals:    05/03/15 1031   BP: 112/62   Site: Right Arm   Position: Sitting   Cuff Size: Large Adult   Pulse: 72   Resp: 16   Weight: 203 lb 12.8 oz (92.4 kg)   Height: 5' 7.8" (1.722 m)     Assessment:      Pre-op physical  Left rotator cuff tear  Chronic depression      Plan:      After reviewing pre-op work up, he is cleared for proposed surgery.  Note to Dr. Allyne Gee  Electronically signed by Sheila Oats, MD on 05/03/2015 at 10:47 AM

## 2015-05-03 NOTE — Progress Notes (Signed)
Pre op clearance form and office notes faxed to Dr. Allyne Gee and Dr. Dulce Sellar

## 2015-05-03 NOTE — Patient Instructions (Signed)
Rotator Cuff Problems: Care Instructions  Your Care Instructions  The rotator cuff is a group of tendons and muscles around the shoulder that keeps the shoulder joint stable and allows you to raise and rotate your arm. Over time, daily wear and exercise can cause the tendons to rub on the bones of your shoulder. This is called impingement. This condition may cause the tendons to bruise, degenerate, or tear.  In many people, these problems do not cause pain. When they do cause pain, you can use rest, physical therapy, ice and heat, and anti-inflammatory medicine to reduce pain and swelling. If you still have pain after trying these treatments, you and your doctor can discuss having a steroid injection or surgery.  Follow-up care is a key part of your treatment and safety. Be sure to make and go to all appointments, and call your doctor if you are having problems. It's also a good idea to know your test results and keep a list of the medicines you take.  How can you care for yourself at home?  ?? Be safe with medicines. Read and follow all instructions on the label.  ?? If the doctor gave you a prescription medicine for pain, take it as prescribed.  ?? If you are not taking a prescription pain medicine, ask your doctor if you can take an over-the-counter medicine.  ?? Put ice or a cold pack on your shoulder for 10 to 20 minutes at a time. Try to do this every 1 to 2 hours for the next 3 days (when you are awake). Put a thin cloth between the ice pack and your skin.  ?? After 3 days, put a warm, wet towel on your shoulder. This is to relax the muscles and increase blood flow. While holding the towel on your shoulder, lean forward so your arm hangs freely, and gently swing your arm back and forth like a pendulum. You also can do this standing under a warm shower.  ?? Follow your doctor's advice for physical therapy. When your doctor says it is okay, try these stretching exercises. Do them slowly to avoid injury. Put a warm,  wet towel on your shoulder before exercising. Stop any exercise that increases pain.  ?? Range-of-motion exercises. If it is not too painful, stretch your arm in four directions: across the body, up the back, to the side, and overhead.  ?? Pendulum exercise. Lean forward and hold onto a table or the back of a chair with your good arm. Bend at the waist, letting the arm with the sore shoulder hang straight down. Swing your arm back and forth like a pendulum, then in circles that start small and slowly grow larger. This exercise does not use the arm muscles. Instead, use your legs and your hips to create movement that makes your arm swing freely. Try this for about 5 minutes, several times a day.  ?? Wall climbing (to the side). Stand with your side to a wall so that your fingers can just touch it. Then turn so your body is turned slightly toward the wall. Walk the fingers of your injured arm up the wall as high as pain permits. Try not to shrug your shoulder up toward your ear as you move your arm up. Hold that position for a count of 15 to 30 seconds. Walk your fingers down to the starting position. Repeat 2 to 4 times, trying to reach higher each time.  ?? Wall climbing (to the front). Face a   wall, standing so your fingers can just touch it. Walk the fingers of your affected arm up the wall as high as pain permits. Try not to shrug your shoulder up toward your ear as you move your arm up. Hold that position for a count of 15 to 30 seconds. Slowly walk your fingers to the starting position. Repeat 2 to 4 times, trying to reach higher each time.  ?? Rest your shoulder when you are not doing stretches and other exercises. Your doctor may tell you to wait for the pain to go away before doing exercises. Do not lift heavy bags of groceries, play sports, or do anything else that makes you twist or stress your shoulder. Avoid activities where you move your affected arm above your head.  When should you call for help?  Call your  doctor now or seek immediate medical care if:  ?? You have severe pain.  ?? You cannot move your shoulder or arm.  ?? You have tingling or numbness in your arm or hand.  ?? Your arm or hand is cool or pale.  Watch closely for changes in your health, and be sure to contact your doctor if:  ?? Your pain gets worse.  ?? You have new or worse swelling in your arm or hand.  ?? You do not get better as expected.   Where can you learn more?   Go to [email or web address] and sign in to your MyChart account. Enter E207 in the Search Health Information box to learn more about ???Rotator Cuff Problems: Care Instructions.???    If you do not have an account, please click on the ???Sign Up Now??? link.   ?? 2006-2016 [employer]. Care instructions adapted under license by [facility]. This care instruction is for use with your licensed healthcare professional. If you have questions about a medical condition or this instruction, always ask your healthcare professional. Healthwise, Incorporated disclaims any warranty or liability for your use of this information.  Content Version: 10.8.513193; Current as of: Dec 10, 2013

## 2015-05-04 ENCOUNTER — Encounter: Attending: Family Medicine

## 2015-05-15 ENCOUNTER — Encounter

## 2015-05-15 ENCOUNTER — Inpatient Hospital Stay: Admit: 2015-05-15 | Attending: Foot & Ankle Surgery

## 2015-05-15 DIAGNOSIS — M4802 Spinal stenosis, cervical region: Secondary | ICD-10-CM

## 2015-05-29 LAB — IRON SATURATION: Iron Saturation: 43 % (ref 20–50)

## 2015-05-29 LAB — VITAMIN B12 & FOLATE
Folate: 6.7 ng/mL (ref 4.8–24.2)
Vitamin B-12: 313 pg/mL (ref 211–911)

## 2015-05-29 LAB — CBC WITH AUTO DIFFERENTIAL
Basophils Absolute: 0 10*3/uL (ref 0.0–0.1)
Basophils: 0.6 %
Eosinophils Absolute: 0.3 10*3/uL (ref 0.0–0.4)
Eosinophils: 4.6 %
Hematocrit: 41.4 % — ABNORMAL LOW (ref 42.0–52.0)
Hemoglobin: 13.7 gm/dl — ABNORMAL LOW (ref 14.0–18.0)
Lymphocytes Absolute: 1.1 10*3/uL (ref 1.0–4.8)
Lymphocytes: 19.3 %
MCH: 30.5 pg (ref 27.0–31.0)
MCHC: 33.1 gm/dl (ref 33.0–37.0)
MCV: 92 fL (ref 80.0–94.0)
MPV: 8.3 mcm (ref 7.4–10.4)
Monocytes Absolute: 0.5 10*3/uL (ref 0.4–1.3)
Monocytes: 9 %
Platelets: 221 10*3/uL (ref 130–400)
RBC Morphology: NORMAL
RBC: 4.5 10*6/uL — ABNORMAL LOW (ref 4.70–6.10)
RDW: 14.4 % (ref 11.5–14.5)
Seg Neutrophils: 66.5 %
Segs Absolute: 3.9 10*3/uL (ref 1.8–7.7)
WBC: 5.8 10*3/uL (ref 4.8–10.8)
nRBC: 0 /100 wbc

## 2015-05-29 LAB — GLOMERULAR FILTRATION RATE, ESTIMATED: Est, Glom Filt Rate: >90 mL/min/{1.73_m2}

## 2015-05-29 LAB — CK: Total CK: 308 U/L — ABNORMAL HIGH (ref 55–170)

## 2015-05-29 LAB — SEDIMENTATION RATE: Sed Rate: 2 mm/hr (ref 0–10)

## 2015-05-29 LAB — ALT: ALT: 24 U/L (ref 11–66)

## 2015-05-29 LAB — IRON AND TIBC
Iron: 124 ug/dL (ref 65–195)
TIBC: 290 ug/dL (ref 171–450)

## 2015-05-29 LAB — FERRITIN: Ferritin: 305 ng/ml (ref 22–322)

## 2015-05-29 LAB — CREATININE: Creatinine: 0.8 mg/dl (ref 0.4–1.2)

## 2015-05-31 NOTE — Progress Notes (Addendum)
[facility]    Date:  05/31/2015  OUTPATIENT REHABILITATION  INSURANCE BENEFIT INFORMATION    PATIENT NAME:   Bradley Oconnell   ACCOUNT NUMBER:   [id number]  PRIMARY INSURANCE COMPANY: [employer]  INSURANCE COMPANY REPRESENTATIVE:  AMY  INSURANCE COMPANY TELEPHONE NUMBER:   [phone]  INSURANCE COMPANY FAX NUMBER:   OUTPATIENT BENEFITS:    DEDUCTIBLE: $3,500 MET     OUT OF POCKET: $NONE    INSURANCE PAYS AT: 1005    PATIENT RESPONSIBILITY AND/OR CO-PAY: NONE  SECONDARY INSURANCE COMPANY:  NONE    INSURANCE THERAPY BENEFIT:  20 VISITS PER CALENDAR YEAR (HARD LIMIT)  AQUATIC THERAPY COVERED: YES  MODALITIES COVERED:  YES  PRE CERTIFICATION REQUIRED:  YES  AUTHORIZATION:   EVAL ONLY APPROVED, FAX EVAL AND RX TO [employer] FOR FURTHER AUTHORIZATION OF TREATMENT.   If you have any questions regarding your insurance benefits please contact our insurance benefit coordinator Claiborne Billings, at [phone].  Please notify a clerical staff member if your insurance information changes during your course of treatment.  This is a quote of benefits and not a guarantee of payment.    VOICE CERT NUMBER:  [id number]      _________________________________   ________________  PATIENT SIGNATURE     DATE      _________________________________   _________________  Melvern Banker     DATE

## 2015-06-01 NOTE — Progress Notes (Signed)
I certify that I have examined the above patient and determined that Physical Medicine and Rehabilitation service is necessary; that the secondary diagnosis for the provision of rehabilitation services is consistent with identified needs; that service will be furnished on an outpatient basis while the patient is in my care; that I approve the above plan of care for up to 90 days or as specifically noted above and will review it within that time frame or more often if the patient???s condition requires.  Attestation, signature or co-signature of physician indicates approval of certification requirements.    ______________________________________  ______________________  Physician Signature      Date    [facility]  OUTPATIENT OCCUPATIONAL THERAPY  EVALUATION  [facility]    Time In: 1105  Time Out: 1145  Minutes: 40  Timed Code Treatment Minutes: 15 Minutes     Date: 06/01/2015  Patient Name: Bradley Oconnell        MRN: [id number]      ACCOUNT #: [id number]  DOB: Jan 26, 1956  (59 y.o.)  Gender: male   Referring Practitioner: Marca Ancona, MD  Diagnosis: L RCR and BT  Treatment Diagnosis: L RCR and BT  Additional Pertinent Hx: Patient presents to therapy after surgery on his left shoulder 05-29-15.     Pain has increased from 30 years of wear and tear.  Follow Dr. Oneida Arenas Type II protocol with no isolation of bicep for 6 weeks.   Patient had RCR on R shoulder 2 years ago.    General:  OT Visit Information  Onset Date: 06/01/15  OT Insurance Information: [employer]BS  Total # of Visits Approved: 20  Total # of Visits to Date: 1  Certification Period Expiration Date: 08/24/15  Progress Note Counter: 1/10 for PN  Comments: Patient returns to Dr. Oneida Arenas on 06/20/15       Restrictions/Precautions:  Restrictions/Precautions: Surgical Protocols     Required Braces or Orthoses  Left Upper Extremity Brace/Splint: Sling    Subjective:  Subjective: Patient arrives to outpatient OT this date status post  RCR and BT on 05/29/15 by Dr. Oneida Arenas. Patient walks into the clinic this date without wearing sling but holding it.  Patient instructed that he sould be wearing sling to prevent injury and promote healing.  Patient repots to have had surgery on R shoulder 2 years prior.  Patient reports 30 years of wear and tear caused his tear.          Pain:  Pain Assessment  Patient Currently in Pain: Yes  Pain Assessment: 0-10  Pain Level: 9  Pain Location: Shoulder  Pain Orientation: Left    Social/Functional History:    Lives With: Significant other     IADL Comments: Patient reports increased difficulty with dressing, toileting, driving, yard work, Pharmacologist, cooking     ADL Assistance: Independent  Homemaking Assistance: Independent  Homemaking Responsibilities: Yes  Meal Prep Responsibility: Secondary  Laundry Responsibility: Secondary  Cleaning Responsibility: Secondary  Shopping Responsibility: Secondary  Dependent Care Responsibility: Secondary (3 dogs)  Ambulation Assistance: Independent  Transfer Assistance: Independent    Active Driver: Yes  Mode of Transportation: Car  Occupation: Full time employment, Retired (reports that he should be retired, but since the hospital needs assistance, he continues to work.)  Type of occupation: Garment/textile technologist at [employer]  Leisure & Hobbies: gardening, reading    Objective     Sensation  Overall Sensation Status: WNL     Hand Dominance: Right  ROM: Impaired    LUE PROM (degrees)  L Shoulder Flex  0-180: 90  L Shoulder Ext Rotation  0-90: 43         Activity Tolerance:  Functional Activity Tolerance: Tolerates 30 min exercise with multiple rests  Additional Comments: Patient tolerated evalutation well.       Assessment:  Conditions Requiring Skilled Therapeutic Intervention : Decreased ADL status, Decreased ROM, Decreased strength, Decreased high-level IADLs  Assessment: Patient presents to outpatient OT this date status post L RCR and BT.  Follow Dr. Oneida Arenas Type II protocol with no  isolation of bicep for 6 weeks.  Patient displayes decrease ROM and strength and increase pain this date.  Patient is now having difficulty with bathing, dressing, toileting, cooking, folding laundry, sleeping and taking care of dogs.      Patient Education:  Patient Education: Patient educated on OT POC, treatment initiated:  pendulums, scapular pinches, and PROM elbow flexion/extension.  Patient provided with picture demonstration and good understanding.  Patient also educated on protocol and wear of sling.          Plan:  Times per week: 2x/wk for 12 wks  Current Treatment Recommendations: ROM, Strengthening, Modalities as needed, Manual Therapy: soft tissue mobiliztion  Plan Comment: POC initiated.    Specific instructions for Next Treatment: ther ex, ROM, strength.  Follow Dr. Oneida Arenas Type II protocol with no isolation of bicep for 6 weeks.  Surgery date 05/29/15    Goals:  Patient goals : Increase ROM and strength of L shoulder    Short term goals  Time Frame for Short term goals: 6 weeks  Short term goal 1: Patient will verbalize and demonstrate independence with HEP to increase ability to perform bathing.  Short term goal 2: Patient will increase PROM of L shoulder flexion to 145 and ER to 70 to increase ability to get dressed.  Short term goal 3: Patient will report no greater than 2/10 pain at rest to sleep through the night.    Short term goal 4: Patient will report no greater than 5/10 pain during dressing.    Long term goals  Time Frame for Long term goals : 12 weeks  Long term goal 1: Patient will increase AROM of L shoulder flexion to 130 degrees and ER to 45 degrees to increase ability to fold laundry.  Long term goal 2: Patient will report no greater than 4/10 pain with movement to increase ability to perform light yard work.        Alcario Drought [place]ck S/OT  Mechele Collin, MOT, OTR/L [phone]

## 2015-06-06 NOTE — Progress Notes (Signed)
[facility]      Date: 06/06/2015    OUTPATIENT REHABILITATION  INSURANCE BENEFIT FOLLOW-UP    PATIENT NAME:  Bradley Oconnell   ACCOUNT NUMBER:  [id number]      INSURANCE COMPANY:   [employer]        INSURANCE COMPANY TELEPHONE NUMBER:    INSURANCE COMPANY FAX NUMBER:        Received authorization of occupational therapy for a total of 12 visits from 06/05/15 to 07/20/15  Auth. [id number]

## 2015-06-20 ENCOUNTER — Inpatient Hospital Stay: Admit: 2015-06-20

## 2015-06-20 NOTE — Progress Notes (Signed)
[facility]  OUTPATIENT OCCUPATIONAL THERAPY  Daily Note  [place]    Time In: 0800  Time Out: [id number]  Minutes: 35  Timed Code Treatment Minutes: 35 Minutes     Date: 06/20/2015  Patient Name: Bradley Oconnell        MRN: [id number]      ACCOUNT #: [id number]  DOB: 08/03/1955  (59 y.o.)  Gender: male   Referring Practitioner: Wilford Corner, MD  Diagnosis: L RCR and BT  Treatment Diagnosis: L RCR and BT  Additional Pertinent Hx: Patient presents to therapy after surgery on his left shoulder 05-29-15.     Pain has increased from 30 years of wear and tear.  Follow Dr. Allyne Gee Type II protocol with no isolation of bicep for 6 weeks.   Patient had RCR on R shoulder 2 years ago.    General:  OT Visit Information  Onset Date: 06/01/15  OT Insurance Information: [employer] BC/BS  Total # of Visits Approved: 20  Total # of Visits to Date: 2  Certification Period Expiration Date: 08/24/15  Progress Note Counter: 2/10 for PN  Comments: Patient returns to Dr. Allyne Gee on 06/20/15       Restrictions/Precautions:  Restrictions/Precautions: Surgical Protocols     Required Braces or Orthoses  Left Upper Extremity Brace/Splint: Sling    Subjective:  Subjective: Patient reports to be feeling well.  Patient reports and average of 6/10 pain that is waking him up at night.  Patient reports performing his exercises at home, but does admit to trying to move his arm actively.  Reports that he is gentle.  Cautioned against active movement at this time.            Pain:  Patient Currently in Pain: Yes  Pain Assessment: 0-10  Pain Level: 6  Pain Location: Shoulder  Pain Orientation: Left     Objective:     Upper Extremity Function  UE PROM: PROM to L shoulder flexion and ER within protocol to patients tolerance.  PROM to L elbow flexion extension 10 reps each.    UE AAROM: Pendulums back and forth and CW/CCW circles 10 reps each, cues provided for proper body movement/passive movement of the shoulder.          Activity  Tolerance:  Additional Comments: Patient tolerated treatment well this date.     Assessment:  Assessment: Patient is progressing toward goals.      Patient Education:  Patient Education: Patient educated on protocol and to continue with current HEP.  Patient educated on no AROM with L elbow and shoulder.  Patient aware of prorocol but still reports to be using LUE.          Plan:  Times per week: 2x/wk for 12 wks  Current Treatment Recommendations: ROM, Strengthening, Modalities (comment), Manual Therapy:  STM  Plan Comment: POC initiated.    Specific instructions for Next Treatment: ther ex, ROM, strength.  Follow Dr. Allyne Gee Type II protocol with no isolation of bicep for 6 weeks.  surgery date 05/29/15     [facility] S/OT   Princess Bruins, MOT, OTR/L [phone]

## 2015-06-22 ENCOUNTER — Inpatient Hospital Stay

## 2015-06-22 NOTE — Progress Notes (Signed)
[place]    Date: 06/22/2015  Patient Name: Bradley Oconnell        MRN: [id number]    Account #: [id number]  DOB: 08/10/1955  (59 y.o.)  Gender: male        OT Visit Information  Canceled Appointment: 1    REASON FOR MISSED TREATMENT:  UNABLE TO MAKE THIS APPOINTMENT.  WILL BE IN AT NEXT SCHEDULED VISIT      Lara Mulch 06/22/2015 1:00 PM

## 2015-06-23 NOTE — Progress Notes (Signed)
[facility]      Date: 06/23/2015    OUTPATIENT REHABILITATION  INSURANCE BENEFIT FOLLOW-UP    PATIENT NAME:  Bradley Oconnell   ACCOUNT NUMBER:  [id number]      INSURANCE COMPANY:  [employer]        INSURANCE COMPANY TELEPHONE NUMBER:    INSURANCE COMPANY FAX NUMBER:    RECEIVED ADJUSTED APPROVAL WITH THE CORRECTED DIAGNOSIS CODE OF S43.422D FOR 12 OT VISITS FROM 06/05/15-07/20/15  REF# [phone]

## 2015-06-26 ENCOUNTER — Encounter

## 2015-06-26 DIAGNOSIS — S43422D Sprain of left rotator cuff capsule, subsequent encounter: Secondary | ICD-10-CM

## 2015-06-27 ENCOUNTER — Inpatient Hospital Stay: Admit: 2015-06-27

## 2015-06-27 ENCOUNTER — Inpatient Hospital Stay: Admit: 2015-06-27 | Attending: Foot & Ankle Surgery

## 2015-06-27 NOTE — Progress Notes (Signed)
[facility]  OUTPATIENT OCCUPATIONAL THERAPY  Daily Note  [place]    Time In: 1000  Time Out: 1030  Minutes: 30  Timed Code Treatment Minutes: 30 Minutes     Date: 06/27/2015  Patient Name: Bradley Oconnell        MRN: [id number]      ACCOUNT #: [id number]  DOB: 15-Aug-1955  (59 y.o.)  Gender: male   Referring Practitioner: Wilford Corner, MD  Diagnosis: L RCR and BT          General:  OT Visit Information  OT Insurance Information: [employer] BC/BS  Total # of Visits Approved: 20  Total # of Visits to Date: 3  Progress Note Counter: 3/10 for PN  Comments: Patient returns to Dr. Allyne Gee in January, unsure of exact date.        Restrictions/Precautions:  Restrictions/Precautions: Surgical Protocols         Required Braces or Orthoses  Left Upper Extremity Brace/Splint: Sling    Subjective:  Subjective: Patient reports his left shoulder is sore, sometimes worse than others. Reports he is completing HEP 3x a day and has no questions regarding them.           Pain:  Patient Currently in Pain: Yes  Pain Assessment: 0-10  Pain Level: 6  Pain Location: Shoulder  Pain Orientation: Left       Objective:     Upper Extremity Function  UE AROM: Scapular retraction x10. Wrist flex/ext, forearm pronation/supination x10 reps each.   UE PROM: Supine PROM to L shoulder flexion to 90 degress and ER to tolerance within protocol.   UE AAROM: Pendulums back and forth and CW/CCW circles 10 reps each, cues provided for proper body movement. Supine elbow flex/ext with shoulder propped on towel roll.     Activity Tolerance:  Additional Comments: Patient tolerated treatment well this date.     Assessment:  Assessment: Patient is progressing toward goals.     Patient Education:  Patient Education: Reviewed protocol, plan of care, and scheduling.        Plan:  Plan Comment: Continue with current plan of care.        [employer] COTA/L [phone]

## 2015-06-29 ENCOUNTER — Inpatient Hospital Stay: Admit: 2015-06-29

## 2015-06-29 NOTE — Progress Notes (Signed)
[facility]  OUTPATIENT OCCUPATIONAL THERAPY  Daily Note  [place]    Time In: 0845  Time Out: 0915  Minutes: 30  Timed Code Treatment Minutes: 30 Minutes     Date: 06/29/2015  Patient Name: Bradley Oconnell        MRN: [id number]      ACCOUNT #: [id number]  DOB: 1955/09/28  (59 y.o.)  Gender: male   Referring Practitioner: Wilford Corner, MD  Diagnosis: L RCR and BT          General:  OT Visit Information  OT Insurance Information: [employer] BC/BS  Total # of Visits Approved: 20  Total # of Visits to Date: 4  Progress Note Counter: 4/10 for PN       Restrictions/Precautions:  Restrictions/Precautions: Surgical Protocols         Required Braces or Orthoses  Left Upper Extremity Brace/Splint: Sling    Subjective:  Subjective: Patient reports his shoulder is sore which is normal for in the morning, reports he didn't take pain medication today stating "I wanted to show you how it really is."           Pain:  Patient Currently in Pain: Yes  Pain Assessment: 0-10  Pain Level: 6  Pain Location: Shoulder  Pain Orientation: Left       Objective:     Upper Extremity Function  UE AROM: Scapular pinches x15, shoulder depressors x15, wrist flex/ext x10, full fisting x10.   UE PROM: Pendulums all directions to warm up, Supine PROM to L shoulder flexion to 90 degress and ER to tolerance within protocol.   UE AAROM: Supine elbow flex/ext with shoulder propped on towel roll x10.   UE Isometrics: Gentle massage and IASTM to L upper trap and gentle scap mobs to decrease tightness with ease noted.     Activity Tolerance:  Additional Comments: Patient tolerated treatment well this date.     Assessment:  Assessment: Patient is progressing toward goals.     Patient Education:  Patient Education: Reviewed protocol.        Plan:  Plan Comment: Continue with current plan of care.        [employer] COTA/L [phone]

## 2015-07-04 ENCOUNTER — Inpatient Hospital Stay: Admit: 2015-07-04

## 2015-07-04 NOTE — Progress Notes (Signed)
[facility]  OUTPATIENT OCCUPATIONAL THERAPY  Daily Note  [place]    Time In: 0730  Time Out: 0808  Minutes: 38  Timed Code Treatment Minutes: 60 Minutes     Date: 07/04/2015  Patient Name: Bradley Oconnell        MRN: [id number]      ACCOUNT #: [id number]  DOB: 11/16/1955  (59 y.o.)  Gender: male   Referring Practitioner: Wilford Corner, MD  Diagnosis: L RCR and BT          General:  OT Visit Information  OT Insurance Information: [employer] BC/BS  Total # of Visits Approved: 20  Total # of Visits to Date: 5  Progress Note Counter: 5/10 for PN  Comments: Patient returns to Dr. Allyne Gee 07/25/15.       Restrictions/Precautions:  Restrictions/Precautions: Surgical Protocols         Required Braces or Orthoses  Left Upper Extremity Brace/Splint: Sling    Subjective:  Subjective: Patient reports his left shoulder was very sore and achy all day yesterday but is feeling much better today. States there are 3 appointments left before he leaves for vacation.           Pain:  Patient Currently in Pain: Yes  Pain Assessment: 0-10  Pain Level: 3  Pain Location: Shoulder  Pain Orientation: Left       Objective:     Upper Extremity Function  UE AROM: Scapular pinches x15, shoulder depressors x15, supine with shoulder supported elbow flex/ext, forearm pronation/supination, wrist flex/ext, full fist x10 reps each.   UE PROM: Pendulums all directions to warm up, supine PROM to L shoulder flexion and ER to tolerance within protocol.   UE Isometrics: Gentle massage and IASTM to L upper trap and down medial scap border to decrease tightness.     Activity Tolerance:  Additional Comments: Patient tolerated treatment well this date.     Assessment:  Assessment: Patient is progressing toward goals.     Patient Education:  Patient Education: Reviewed protocol and scheduling.        Plan:  Plan Comment: Continue with current plan of care.        [employer] COTA/L [phone]

## 2015-07-07 ENCOUNTER — Inpatient Hospital Stay: Admit: 2015-07-07

## 2015-07-07 NOTE — Progress Notes (Signed)
[facility]  OUTPATIENT OCCUPATIONAL THERAPY  Daily Note  [place]    Time In: [id number]  Time Out: [place]  Minutes: 40  Timed Code Treatment Minutes: 40 Minutes     Date: 07/07/2015  Patient Name: Bradley Oconnell        MRN: [id number]      ACCOUNT #: [id number]  DOB: 1956/02/16  (59 y.o.)  Gender: male   Referring Practitioner: Wilford Corner, MD  Diagnosis: L RCR and BT          General:  OT Visit Information  Onset Date: 06/01/15  OT Insurance Information: [employer] BC/BS  Total # of Visits Approved: 20  Total # of Visits to Date: 6  Certification Period Expiration Date: 08/24/15  Progress Note Counter: 6/10 for PN       Restrictions/Precautions:       Position Activity Restriction  Other position/activity restrictions: s/p surgery on 05-29-15; Type II protocol with no isolation of bicep for 6 weeks.          Subjective:  Subjective: Patient reports that his pain has not changed and is not to bad.            Pain:  Patient Currently in Pain: Yes  Pain Assessment: 0-10  Pain Level: 2  Pain Location: Shoulder  Pain Orientation: Left       Objective:     Upper Extremity Function  UE AROM: Scapular pinches x15, shoulder depressors x15  UE PROM: Pendulums all directions to warm up, supine PROM to L shoulder flexion, IR/ER to tolerance within protocol.   UE Isometrics: at the beginning of the session, great crepitus noted in the muscle of the scapula during retraction.  IASTM to the upper trap and medial border of the scapula, cross friction massage to the upper trap and levator scap and no crepitis in the muscle noted after.          Activity Tolerance:  Additional Comments: Patient tolerated treatment well this date.     Assessment:  Assessment: Patient is progressing toward goals.     Patient Education:  Patient Education: Reviewed protocol and scheduling.  Plan to add additional exercises next treatment session due to patient being at week 6 of protocol and going on vacation and will not  return until after the new year.              Plan:  Times per week: 2x/wk for 12 wks  Current Treatment Recommendations: ROM, Strengthening, Modalities (comment), Manual Therapy:  STM  Plan Comment: Continue with current plan of care.   Specific instructions for Next Treatment: ther ex, ROM, strength.  Follow Dr. Allyne Gee Type II protocol with no isolation of bicep for 6 weeks.  surgery date 05/29/15        Princess Bruins, MOT, OTR/L [phone]

## 2015-07-11 ENCOUNTER — Inpatient Hospital Stay: Admit: 2015-07-11

## 2015-07-11 NOTE — Progress Notes (Signed)
[facility]  OUTPATIENT OCCUPATIONAL THERAPY  Daily Note  [place]    Time In: 1000  Time Out: 1040  Minutes: 40  Timed Code Treatment Minutes: 40 Minutes     Date: 07/11/2015  Patient Name: Bradley Oconnell        MRN: [id number]      ACCOUNT #: [id number]  DOB: July 04, 1956  (59 y.o.)  Gender: male   Referring Practitioner: Wilford Corner, MD  Diagnosis: L RCR and BT          General:  OT Visit Information  Onset Date: 06/01/15  OT Insurance Information: [employer]them BC/BS  Total # of Visits Approved: 20  Total # of Visits to Date: 7  Certification Period Expiration Date: 08/24/15  Progress Note Counter: 7/10 for PN       Restrictions/Precautions:  Restrictions/Precautions: Surgical Protocols    Position Activity Restriction  Other position/activity restrictions: s/p surgery on 05-29-15; Type II protocol with no isolation of bicep for 6 weeks.          Subjective:  Subjective: Patient reports that he is having muscle tightness and pain          Pain:  Patient Currently in Pain: Yes  Pain Assessment: 0-10  Pain Level: 6  Pain Location: Shoulder  Pain Orientation: Left       Objective:     Upper Extremity Function  UE AROM: Scapular pinches x15, shoulder depressors x15  UE PROM: Pendulums all directions to warm up with cues for technique, supine PROM to L shoulder flexion, IR/ER to tolerance within protocol.   UE AAROM: supine dowel bench press and flexion to tolerance, ER with dowel rod, table slides in flexion  UE Stretching: IASTM to the upper trap and medial border of the scapula, cross friction massage to the upper trap and levator scap to decrease the great tightness noted, use of vibration wand to assist to decrease tightness prior to IASTM and cross friction massage  UE Isometrics: initiated isometrics this date: flexion, extension, ER, IR and abduction with a 5 second hold, 50% effort                Activity Tolerance:  Additional Comments: Patient tolerated treatment well this date.  No compliants with new additions to HEP    Assessment:  Assessment: Patient is progressing toward goals.  Great tightness noted in the levator scap and upper trap again this visit.  Increased time during the session required to decrease tightness    Patient Education:  Patient Education: added supine dowel exercises, table slides and isometrics this visit            Plan:  Times per week: 2x/wk for 12 wks  Plan Comment: Continue with current plan of care. Patient will not return to the clinic until 07-24-14 due to vacation.    Specific instructions for Next Treatment: ther ex, ROM, strength.  Follow Dr. Allyne Gee Type II protocol with no isolation of bicep for 6 weeks.  surgery date 05/29/15        Princess Bruins, MOT, OTR/L [phone]

## 2015-07-12 LAB — CBC WITH AUTO DIFFERENTIAL
Basophils Absolute: 0 10*3/uL (ref 0.0–0.1)
Basophils: 0.4 %
Eosinophils Absolute: 0.2 10*3/uL (ref 0.0–0.4)
Eosinophils: 3.7 %
Hematocrit: 43.2 % (ref 42.0–52.0)
Hemoglobin: 14.9 gm/dl (ref 14.0–18.0)
Lymphocytes Absolute: 1.3 10*3/uL (ref 1.0–4.8)
Lymphocytes: 23 %
MCH: 31 pg (ref 27.0–31.0)
MCHC: 34.5 gm/dl (ref 33.0–37.0)
MCV: 90.1 fL (ref 80.0–94.0)
MPV: 7.6 mcm (ref 7.4–10.4)
Monocytes Absolute: 0.5 10*3/uL (ref 0.4–1.3)
Monocytes: 8.6 %
Platelets: 253 10*3/uL (ref 130–400)
RBC Morphology: NORMAL
RBC: 4.8 10*6/uL (ref 4.70–6.10)
RDW: 14.1 % (ref 11.5–14.5)
Seg Neutrophils: 64.3 %
Segs Absolute: 3.5 10*3/uL (ref 1.8–7.7)
WBC: 5.5 10*3/uL (ref 4.8–10.8)
nRBC: 0 /100 wbc

## 2015-07-12 LAB — SEDIMENTATION RATE: Sed Rate: 2 mm/hr (ref 0–10)

## 2015-07-12 LAB — CREATININE: Creatinine: 0.8 mg/dl (ref 0.4–1.2)

## 2015-07-12 LAB — ALT: ALT: 20 U/L (ref 11–66)

## 2015-07-12 LAB — GLOMERULAR FILTRATION RATE, ESTIMATED: Est, Glom Filt Rate: >90 mL/min/{1.73_m2}

## 2015-07-14 ENCOUNTER — Encounter

## 2015-07-18 ENCOUNTER — Encounter

## 2015-07-20 ENCOUNTER — Encounter

## 2015-07-25 ENCOUNTER — Inpatient Hospital Stay

## 2015-07-25 NOTE — Progress Notes (Signed)
[place]    Date: 07/25/2015  Patient Name: Bradley Oconnell        MRN: [id number]    Account #: [id number]  DOB: 01-12-1956  (60 y.o.)  Gender: male        OT Visit Information  Canceled Appointment: 1    REASON FOR MISSED TREATMENT:  Cancelled by patient going to see Doctor this afternoon not sure if her will return doing well per patient.      Gillian Scarce 07/25/2015 8:17 AM

## 2015-07-25 NOTE — Progress Notes (Signed)
[place]    Date: 07/25/2015  Patient Name: Bradley Oconnell        MRN: [id number]    Account #: [id number]  DOB: 02-Nov-1955  (60 y.o.)  Gender: male   Referring Practitioner: Wilford Corner, MD  Diagnosis: L RCR and BT    Patient is discharged from Occupational Therapy services at this time.  See last note for details related to results of therapy and goal achievement.    Reason for discharge:  Patient called the clinic and cancelled appointment today due to follow up doctors appointment conflicting. Following appointment patient called and notified clinic that his physician approved discharge from OT at this time.       [employer] COTA/L [phone]

## 2015-07-27 ENCOUNTER — Encounter

## 2015-09-14 ENCOUNTER — Telehealth

## 2015-09-14 NOTE — Telephone Encounter (Signed)
Ok ,unsure who dr Sherrell Puller is.

## 2015-09-14 NOTE — Telephone Encounter (Signed)
Per HIPAA, left message for pt notifying him ok for referral but to call the office for more information on the physician he is requesting referral to.

## 2015-09-14 NOTE — Telephone Encounter (Signed)
Pt Bradley Oconnell requests referral to Dr Sherrell Puller d/t   1.) progressively worse arthritis pain; says he's "getting nowhere" with meds per Dr Conan Bowens  2.) unhappy with long (up to 3-hour) wait time to see Dr Conan Bowens for scheduled appts.  Pls call pt at [phone] to reply.  He understands he may not hear back from [place] until next week.  Last ov: 05/03/15 pre-op

## 2015-09-14 NOTE — Telephone Encounter (Signed)
Pt called back:  Dr Porfirio Mylar (maybe an internist but he says she's listed as arthritis specialist) phone: [phone].

## 2015-09-19 NOTE — Telephone Encounter (Signed)
Tried calling Dr Ramsay's office but no answer.  Will try again later.

## 2015-09-19 NOTE — Telephone Encounter (Signed)
Tried calling Dr Ramsay's office again and still no answer.  [phone].

## 2015-09-20 NOTE — Telephone Encounter (Signed)
Left message for pt to call the office.    Per Dr. Dionisio Keahi notes, med list and labs faxed to their office at [phone].  They will contact the pt to schedule an appt.

## 2015-09-20 NOTE — Telephone Encounter (Signed)
Patient notified information was faxed to Dr. Senaida Lange office and that they will be contacting him to schedule an appt

## 2015-10-10 ENCOUNTER — Encounter

## 2015-10-10 MED ORDER — TRANYLCYPROMINE SULFATE 10 MG PO TABS
10 MG | ORAL_TABLET | Freq: Three times a day (TID) | ORAL | 1 refills | Status: DC
Start: 2015-10-10 — End: 2016-09-02

## 2015-10-10 MED ORDER — FINASTERIDE 5 MG PO TABS
5 MG | ORAL_TABLET | Freq: Every day | ORAL | 1 refills | Status: DC
Start: 2015-10-10 — End: 2019-03-30

## 2015-10-10 MED ORDER — TOPIRAMATE 100 MG PO TABS
100 MG | ORAL_TABLET | Freq: Three times a day (TID) | ORAL | 1 refills | Status: DC
Start: 2015-10-10 — End: 2016-09-02

## 2015-10-10 MED ORDER — DOXYCYCLINE HYCLATE 100 MG PO TABS
100 MG | ORAL_TABLET | Freq: Two times a day (BID) | ORAL | 1 refills | Status: DC
Start: 2015-10-10 — End: 2016-05-27

## 2015-10-10 NOTE — Telephone Encounter (Signed)
ep'ed requested meds to pharm requested

## 2015-10-10 NOTE — Telephone Encounter (Signed)
T/C [place] - states he has new insurance with [facility] from his spouse and now has to have med refills changed to [place].

## 2015-11-10 ENCOUNTER — Ambulatory Visit: Admit: 2015-11-10 | Discharge: 2015-11-10 | Payer: PRIVATE HEALTH INSURANCE | Attending: Family Medicine

## 2015-11-10 DIAGNOSIS — M2550 Pain in unspecified joint: Secondary | ICD-10-CM

## 2015-11-10 LAB — HEMOGLOBIN AND HEMATOCRIT
Hematocrit: 41.7 % — ABNORMAL LOW (ref 42.0–52.0)
Hemoglobin: 14.3 gm/dl (ref 14.0–18.0)

## 2015-11-10 LAB — CREATININE: Creatinine: 0.9 mg/dl (ref 0.4–1.2)

## 2015-11-10 LAB — ALT: ALT: 20 U/L (ref 11–66)

## 2015-11-10 LAB — VITAMIN D 25 HYDROXY: Vit D, 25-Hydroxy: 51 ng/ml (ref 30–100)

## 2015-11-10 LAB — GLOMERULAR FILTRATION RATE, ESTIMATED: Est, Glom Filt Rate: 86 mL/min/{1.73_m2} — AB

## 2015-11-10 MED ORDER — GABAPENTIN 300 MG PO CAPS
300 MG | ORAL_CAPSULE | Freq: Three times a day (TID) | ORAL | 3 refills | Status: DC
Start: 2015-11-10 — End: 2015-12-13

## 2015-11-10 NOTE — Progress Notes (Signed)
Subjective:      Patient ID: Bradley Oconnell is a 60 y.o. male.    HPI  Pt here for pain everywhere.  Has seen rheum, tried multiple NSAIDS without help.  Is wondering about fibro meds.  Has had to quit working.  Reviewed BMI of 32.  Encouraged diet, exercise and weight loss.  Partnered, former smoker,pmh reviewed.    Review of Systems   Constitutional: Negative for chills, fatigue, fever and unexpected weight change.   HENT: Negative for congestion, ear pain, rhinorrhea and sore throat.    Eyes: Negative for pain and visual disturbance.   Respiratory: Negative for cough, chest tightness, shortness of breath and wheezing.    Cardiovascular: Negative for chest pain and palpitations.   Gastrointestinal: Negative for abdominal pain, blood in stool, constipation, diarrhea, nausea and vomiting.   Genitourinary: Negative for difficulty urinating, frequency, hematuria and urgency.   Musculoskeletal: Positive for arthralgias. Negative for back pain, joint swelling, myalgias and neck pain.   Skin: Negative for rash.   Neurological: Negative for dizziness and headaches.   Hematological: Negative for adenopathy. Does not bruise/bleed easily.   Psychiatric/Behavioral: Negative for behavioral problems and sleep disturbance. The patient is not nervous/anxious.        Objective:   Physical Exam   Constitutional: He is oriented to person, place, and time. He appears well-developed and well-nourished.   HENT:   Head: Normocephalic and atraumatic.   Right Ear: External ear normal.   Left Ear: External ear normal.   Nose: Nose normal.   Mouth/Throat: Oropharynx is clear and moist.   Eyes: EOM are normal. Pupils are equal, round, and reactive to light.   Neck: Neck supple. No thyromegaly present.   Cardiovascular: Normal rate, regular rhythm and normal heart sounds.    Pulmonary/Chest: Breath sounds normal. He has no wheezes. He has no rales.   Abdominal: Soft. Bowel sounds are normal. There is no tenderness. There is no rebound and no  guarding.   Musculoskeletal: Normal range of motion. He exhibits no edema.   Lymphadenopathy:     He has no cervical adenopathy.   Neurological: He is alert and oriented to person, place, and time. He has normal reflexes. No cranial nerve deficit.   Skin: Skin is warm and dry. No rash noted.   Psychiatric: He has a normal mood and affect.   Nursing note and vitals reviewed.      Assessment:      Arthalgias      Plan:      Neurontin 300 mg TID

## 2015-11-10 NOTE — Patient Instructions (Signed)
Joint Pain: Care Instructions  Your Care Instructions  Many people have small aches and pains from overuse or injury to muscles and joints. Joint injuries often happen during sports or recreation, work tasks, or projects around the home. An overuse injury can happen when you put too much stress on a joint or when you do an activity that stresses the joint over and over, such as using the computer or rowing a boat.  You can take action at home to help your muscles and joints get better. You should feel better in 1 to 2 weeks, but it can take 3 months or more to heal completely.  Follow-up care is a key part of your treatment and safety. Be sure to make and go to all appointments, and call your doctor if you are having problems. It's also a good idea to know your test results and keep a list of the medicines you take.  How can you care for yourself at home?  ?? Do not put weight on the injured joint for at least a day or two.  ?? For the first day or two after an injury, do not take hot showers or baths, and do not use hot packs. The heat could make swelling worse.  ?? Put ice or a cold pack on the sore joint for 10 to 20 minutes at a time. Try to do this every 1 to 2 hours for the next 3 days (when you are awake) or until the swelling goes down. Put a thin cloth between the ice and your skin.  ?? Wrap the injury in an elastic bandage. Do not wrap it too tightly because this can cause more swelling.  ?? Prop up the sore joint on a pillow when you ice it or anytime you sit or lie down during the next 3 days. Try to keep it above the level of your heart. This will help reduce swelling.  ?? Take an over-the-counter pain medicine, such as acetaminophen (Tylenol), ibuprofen (Advil, Motrin), or naproxen (Aleve). Read and follow all instructions on the label.  ?? After 1 or 2 days of rest, begin moving the joint gently. While the joint is still healing, you can begin to exercise using activities that do not strain or hurt the  painful joint.  When should you call for help?  Call your doctor now or seek immediate medical care if:  ?? You have signs of infection, such as:  ?? Increased pain, swelling, warmth, and redness.  ?? Red streaks leading from the joint.  ?? A fever.  Watch closely for changes in your health, and be sure to contact your doctor if:  ?? Your movement or symptoms are not getting better after 1 to 2 weeks of home treatment.  Where can you learn more?  Go to [email or web address] and sign in to your MyChart account. Enter P205 in the Search Health Information box to learn more about "Joint Pain: Care Instructions."     If you do not have an account, please click on the "Sign Up Now" link.  Current as of: Dec 12, 2014  Content Version: 11.2  ?? 2006-2017 [employer]. Care instructions adapted under license by [facility]. If you have questions about a medical condition or this instruction, always ask your healthcare professional. [employer], [employer] disclaims any warranty or liability for your use of this information.

## 2015-12-13 ENCOUNTER — Telehealth

## 2015-12-13 MED ORDER — PREGABALIN 75 MG PO CAPS
75 MG | ORAL_CAPSULE | Freq: Two times a day (BID) | ORAL | 2 refills | Status: DC
Start: 2015-12-13 — End: 2016-05-27

## 2015-12-13 NOTE — Telephone Encounter (Signed)
T/C Waunita Schooner - was in a couple weeks ago and given RX of Gabapentin.  He had a reaction of leg swelling.  He stopped it. He is interested in trying the Lyrica.  Uses [facility] pharmacy.  He will check pharm if he does not hear back.

## 2015-12-13 NOTE — Telephone Encounter (Signed)
ep'ed lyrica to pharm requested    Controlled Substances Monitoring: Attestation: The Prescription Monitoring Report for this patient was reviewed today. Sheila Oats, MD)  Documentation: No signs of potential drug abuse or diversion identified. Sheila Oats, MD)

## 2016-01-24 ENCOUNTER — Encounter: Attending: Family Medicine

## 2016-01-31 ENCOUNTER — Inpatient Hospital Stay

## 2016-01-31 ENCOUNTER — Encounter

## 2016-01-31 ENCOUNTER — Ambulatory Visit: Admit: 2016-01-31

## 2016-01-31 DIAGNOSIS — M1990 Unspecified osteoarthritis, unspecified site: Secondary | ICD-10-CM

## 2016-01-31 LAB — HEPATITIS C ANTIBODY: Hepatitis C Ab: NEGATIVE

## 2016-01-31 LAB — HEPATITIS B SURFACE ANTIBODY: Hep B S Ab: POSITIVE

## 2016-01-31 LAB — HEPATITIS B CORE ANTIBODY, IGM: Hep B Core Ab, IgM: NEGATIVE

## 2016-01-31 LAB — HEPATITIS B SURFACE ANTIGEN: Hepatitis B Surface Ag: NEGATIVE

## 2016-01-31 NOTE — Other (Addendum)
Patient Acct Nbr:  [id number]  Primary AUTH/CERT:    [place] Name:   [place]  Primary Insurance Plan Name:  PLUS [facility] EMP  Primary Insurance Group Number:  [id number]  Primary Insurance Plan Type:   Primary Insurance Policy Number:  [id number]    Secondary AUTH/CERT:    [place] Name:   .COMMERCIAL PRO FEE  Secondary Insurance Plan Name:  PLUS [facility] EMP  Secondary Insurance Group Number:  [id number]  Secondary Insurance Plan Type:   Secondary Insurance Policy Number:  [id number]

## 2016-03-11 ENCOUNTER — Inpatient Hospital Stay: Payer: PRIVATE HEALTH INSURANCE

## 2016-03-11 DIAGNOSIS — M159 Polyosteoarthritis, unspecified: Secondary | ICD-10-CM

## 2016-03-11 LAB — SEDIMENTATION RATE: Sed Rate: 2 mm/hr (ref 0–10)

## 2016-03-11 LAB — CBC WITH AUTO DIFFERENTIAL
Basophils Absolute: 0 10*3/uL (ref 0.0–0.1)
Basophils: 0.8 %
Eosinophils Absolute: 0.2 10*3/uL (ref 0.0–0.4)
Eosinophils: 3.3 %
Hematocrit: 41.6 % — ABNORMAL LOW (ref 42.0–52.0)
Hemoglobin: 14.1 gm/dl (ref 14.0–18.0)
Lymphocytes Absolute: 1.2 10*3/uL (ref 1.0–4.8)
Lymphocytes: 20.9 %
MCH: 31.8 pg — ABNORMAL HIGH (ref 27.0–31.0)
MCHC: 33.8 gm/dl (ref 33.0–37.0)
MCV: 94.1 fL — ABNORMAL HIGH (ref 80.0–94.0)
MPV: 9.4 mcm (ref 7.4–10.4)
Monocytes Absolute: 0.4 10*3/uL (ref 0.4–1.3)
Monocytes: 7.7 %
Platelets: 182 10*3/uL (ref 130–400)
RBC Morphology: NORMAL
RBC: 4.42 10*6/uL — ABNORMAL LOW (ref 4.70–6.10)
RDW: 13.6 % (ref 11.5–14.5)
Seg Neutrophils: 67.3 %
Segs Absolute: 3.8 10*3/uL (ref 1.8–7.7)
WBC: 5.7 10*3/uL (ref 4.8–10.8)
nRBC: 0 /100 wbc

## 2016-03-11 LAB — CREATININE: Creatinine: 0.9 mg/dL (ref 0.4–1.2)

## 2016-03-11 LAB — ALT: ALT: 15 U/L (ref 11–66)

## 2016-03-11 LAB — GLOMERULAR FILTRATION RATE, ESTIMATED: Est, Glom Filt Rate: 86 mL/min/{1.73_m2} — AB

## 2016-03-11 LAB — ALBUMIN: Albumin: 4.3 g/dL (ref 3.5–5.1)

## 2016-05-27 ENCOUNTER — Ambulatory Visit: Admit: 2016-05-27 | Discharge: 2016-05-27 | Payer: PRIVATE HEALTH INSURANCE | Attending: Family Medicine

## 2016-05-27 ENCOUNTER — Inpatient Hospital Stay: Admit: 2016-05-27 | Payer: PRIVATE HEALTH INSURANCE

## 2016-05-27 DIAGNOSIS — M79661 Pain in right lower leg: Secondary | ICD-10-CM

## 2016-05-27 NOTE — Telephone Encounter (Signed)
Notify pt, doppler was negative, no clotting.  Await MRI scans from specialists.

## 2016-05-27 NOTE — Telephone Encounter (Signed)
Pt notified of results.

## 2016-05-27 NOTE — Progress Notes (Signed)
STAT venous doppler scheduled for today @ 1:00pm.  Arrive @ 12:30 - 2nd floor heart center.    Pt notified of appt time and date in the office.

## 2016-05-27 NOTE — Telephone Encounter (Signed)
Bradley Oconnell with vascular calling stating Bradley Oconnell is negative for dvt in the right leg.

## 2016-05-27 NOTE — Telephone Encounter (Signed)
Left message for pt to call the office.

## 2016-05-27 NOTE — Progress Notes (Signed)
Subjective:      Patient ID: Bradley Oconnell is a 60 y.o. male.    HPI  Pt here for 5 days of progressive right leg and calf pain.  It is awaking him up at night.  Not bad when he is up on it.  Has MRIs upcoming on the ankle per [facility].  Has bad circulation also.  The pain is severe, using heat, norcos.  Reviewed BMI of 32.  Encouraged diet, exercise and weight loss.  Partnered, former smoker, pmh reviewed.     Review of Systems   Constitutional: Negative for chills, fatigue, fever and unexpected weight change.   HENT: Negative for congestion, ear pain, rhinorrhea and sore throat.    Eyes: Negative for pain and visual disturbance.   Respiratory: Negative for cough, chest tightness, shortness of breath and wheezing.    Cardiovascular: Negative for chest pain and palpitations.   Gastrointestinal: Negative for abdominal pain, blood in stool, constipation, diarrhea, nausea and vomiting.   Genitourinary: Negative for difficulty urinating, frequency, hematuria and urgency.   Musculoskeletal: Negative for back pain, joint swelling, myalgias and neck pain.        Right leg, calf, ankle pain   Skin: Negative for rash.   Neurological: Negative for dizziness and headaches.   Hematological: Negative for adenopathy. Does not bruise/bleed easily.   Psychiatric/Behavioral: Negative for behavioral problems and sleep disturbance. The patient is not nervous/anxious.        Objective:   Physical Exam   Constitutional: He is oriented to person, place, and time. He appears well-developed and well-nourished.   HENT:   Head: Normocephalic and atraumatic.   Right Ear: External ear normal.   Left Ear: External ear normal.   Nose: Nose normal.   Mouth/Throat: Oropharynx is clear and moist.   Eyes: EOM are normal. Pupils are equal, round, and reactive to light.   Neck: Neck supple. Carotid bruit is not present. No thyromegaly present.   Cardiovascular: Normal rate, regular rhythm and normal heart sounds.    Pulmonary/Chest: Breath sounds  normal. He has no wheezes. He has no rales.   Abdominal: Soft. Bowel sounds are normal. There is no tenderness. There is no rebound and no guarding.   Musculoskeletal:        Right ankle: He exhibits decreased range of motion and swelling. Tenderness.        Right lower leg: He exhibits tenderness and edema.        Feet:    Negative Homans sign.  Right leg mid calf 39 cm, left leg mid calf 37 cm.     Lymphadenopathy:     He has no cervical adenopathy.   Neurological: He is alert and oriented to person, place, and time. He has normal reflexes. No cranial nerve deficit.   Skin: Skin is warm and dry. No rash noted.   Psychiatric: He has a normal mood and affect.   Nursing note and vitals reviewed.      Assessment:      Right calf pain  Right ankle pain  Recurrent depression        Plan:      Stat venous doppler LE, [facility].  Rule out DVT with the size difference.

## 2016-06-17 ENCOUNTER — Encounter

## 2016-06-17 ENCOUNTER — Inpatient Hospital Stay: Admit: 2016-06-17 | Payer: PRIVATE HEALTH INSURANCE

## 2016-06-17 DIAGNOSIS — M8560 Other cyst of bone, unspecified site: Secondary | ICD-10-CM

## 2016-08-26 ENCOUNTER — Inpatient Hospital Stay: Admit: 2016-08-26 | Payer: PRIVATE HEALTH INSURANCE

## 2016-08-26 ENCOUNTER — Inpatient Hospital Stay: Payer: PRIVATE HEALTH INSURANCE

## 2016-08-26 ENCOUNTER — Encounter

## 2016-08-26 DIAGNOSIS — M1712 Unilateral primary osteoarthritis, left knee: Secondary | ICD-10-CM

## 2016-08-26 LAB — CBC
Hematocrit: 42.6 % (ref 42.0–52.0)
Hemoglobin: 14.4 gm/dl (ref 14.0–18.0)
MCH: 31.1 pg — ABNORMAL HIGH (ref 27.0–31.0)
MCHC: 33.7 gm/dl (ref 33.0–37.0)
MCV: 92.2 fL (ref 80.0–94.0)
MPV: 8.7 fL (ref 7.4–10.4)
Platelets: 222 10*3/uL (ref 130–400)
RBC: 4.62 10*6/uL — ABNORMAL LOW (ref 4.70–6.10)
RDW: 13.8 % (ref 11.5–14.5)
WBC: 6.8 10*3/uL (ref 4.8–10.8)

## 2016-08-26 LAB — ANION GAP: Anion Gap: 13 meq/L (ref 8.0–16.0)

## 2016-08-26 LAB — URINE WITH REFLEXED MICRO
Blood, Urine: NEGATIVE
CASTS 2: NONE SEEN /lpf
Casts UA: NONE SEEN /lpf
Crystals, UA: NONE SEEN
Epithelial Cells, UA: NONE SEEN /hpf (ref 3–?)
Glucose, Ur: NEGATIVE mg/dl
Ketones, Urine: NEGATIVE
Leukocyte Esterase, Urine: NEGATIVE
MISCELLANEOUS 2: NONE SEEN
Nitrite, Urine: NEGATIVE
Protein, UA: NEGATIVE
RBC, UA: NONE SEEN /hpf (ref 0–?)
Renal Epithelial, UA: NONE SEEN
Specific Gravity, Urine: 1.015 (ref 1.002–1.03)
Urobilinogen, Urine: 0.2 eu/dl (ref 0.0–1.0)
Yeast, UA: NONE SEEN
pH, UA: 7.5 (ref 5.0–9.0)

## 2016-08-26 LAB — BILE ACIDS, TOTAL: Ictotest: NEGATIVE

## 2016-08-26 LAB — COMPREHENSIVE METABOLIC PANEL
ALT: 16 U/L (ref 11–66)
AST: 23 U/L (ref 5–40)
Albumin: 4.6 g/dL (ref 3.5–5.1)
Alkaline Phosphatase: 75 U/L (ref 38–126)
BUN: 19 mg/dL (ref 7–22)
CO2: 25 meq/L (ref 23–33)
Calcium: 8.9 mg/dL (ref 8.5–10.5)
Chloride: 102 meq/L (ref 98–111)
Creatinine: 1 mg/dL (ref 0.4–1.2)
Glucose: 89 mg/dL (ref 70–108)
Potassium: 3.8 meq/L (ref 3.5–5.2)
Sodium: 140 meq/L (ref 135–145)
Total Bilirubin: 0.6 mg/dL (ref 0.3–1.2)
Total Protein: 7.5 g/dL (ref 6.1–8.0)

## 2016-08-26 LAB — EKG 12-LEAD
Atrial Rate: 58 {beats}/min
P Axis: 57 degrees
P-R Interval: 174 ms
Q-T Interval: 450 ms
QRS Duration: 96 ms
QTc Calculation (Bazett): 441 ms
R Axis: 53 degrees
T Axis: 49 degrees
Ventricular Rate: 58 {beats}/min

## 2016-08-26 LAB — GLOMERULAR FILTRATION RATE, ESTIMATED: Est, Glom Filt Rate: 76 mL/min/{1.73_m2} — AB

## 2016-08-26 LAB — ABO/RH: Rh Factor: NEGATIVE

## 2016-08-26 LAB — APTT: aPTT: 30.5 seconds (ref 22.0–38.0)

## 2016-08-26 LAB — PROTIME-INR: INR: 0.94 (ref 0.85–1.13)

## 2016-08-26 LAB — MRSA BY PCR: MRSA SCREEN RT-PCR: POSITIVE — AB

## 2016-09-02 ENCOUNTER — Ambulatory Visit: Admit: 2016-09-02 | Discharge: 2016-09-02 | Payer: PRIVATE HEALTH INSURANCE | Attending: Family Medicine

## 2016-09-02 DIAGNOSIS — Z01818 Encounter for other preprocedural examination: Secondary | ICD-10-CM

## 2016-09-02 MED ORDER — TOPIRAMATE 100 MG PO TABS
100 | ORAL_TABLET | Freq: Three times a day (TID) | ORAL | 3 refills | Status: DC
Start: 2016-09-02 — End: 2018-04-22

## 2016-09-02 MED ORDER — ETODOLAC ER 600 MG PO TB24
600 | ORAL_TABLET | Freq: Two times a day (BID) | ORAL | 3 refills | Status: DC
Start: 2016-09-02 — End: 2016-11-19

## 2016-09-02 MED ORDER — TRANYLCYPROMINE SULFATE 10 MG PO TABS
10 | ORAL_TABLET | Freq: Three times a day (TID) | ORAL | 3 refills | Status: DC
Start: 2016-09-02 — End: 2018-04-22

## 2016-09-02 NOTE — Patient Instructions (Signed)
Patient Education        Total Knee Replacement: Before Your Surgery  What is a total knee replacement?    A total knee replacement replaces the worn ends of the bones where they meet at the knee. Those bones are the thighbone (femur) and the lower leg bone (tibia). Your doctor will remove the damaged bone. Then he or she will replace it with plastic and metal parts. These new parts may be attached to your bones with cement.  Your doctor will make a cut down the center of your knee. This cut is called an incision. It will be about 8 to 10 inches long. Sometimes the surgery can be done with a smaller incision that is 4 to 6 inches. Both kinds of incisions leave scars that usually fade with time.  You probably will be able to go home about 3 to 5 days after surgery. If you have both knees done at the same time, you may need to be in the hospital for 1 week. If there is no one to help you at home, you may go to a rehab center.  Most people go back to normal activities or work in 4 to 16 weeks. This depends on your health. It also depends on how well your knee does in your rehab program. This may take longer if you have both knees done at the same time.  Follow-up care is a key part of your treatment and safety. Be sure to make and go to all appointments, and call your doctor if you are having problems. It's also a good idea to know your test results and keep a list of the medicines you take.  What happens before surgery?  ?Surgery can be stressful. This information will help you understand what you can expect. And it will help you safely prepare for surgery.  ?Preparing for surgery  ? ?? Understand exactly what surgery is planned, along with the risks, benefits, and other options.    ?? Tell your doctors ALL the medicines, vitamins, supplements, and herbal remedies you take. Some of these can increase the risk of bleeding or interact with anesthesia.   ? ?? If you take blood thinners, such as warfarin (Coumadin),  clopidogrel (Plavix), or aspirin, be sure to talk to your doctor. He or she will tell you if you should stop taking these medicines before your surgery. Make sure that you understand exactly what your doctor wants you to do.   ? ?? Your doctor will tell you which medicines to take or stop before your surgery. You may need to stop taking certain medicines a week or more before surgery. So talk to your doctor as soon as you can.   ? ?? If you have an advance directive, let your doctor know. It may include a living will and a durable power of attorney for health care. Bring a copy to the hospital. If you don't have one, you may want to prepare one. It lets your doctor and loved ones know your health care wishes. Doctors advise that everyone prepare these papers before any type of surgery or procedure.   ? ?? You may need to shower or bathe with a special soap the night before and the morning of your surgery. The soap contains chlorhexidine. It reduces the amount of bacteria on your skin that could cause an infection after surgery.   What happens on the day of surgery?   ?? Follow the instructions exactly about when to   stop eating and drinking. If you don't, your surgery may be canceled. If your doctor told you to take your medicines on the day of surgery, take them with only a sip of water.   ? ?? Take a bath or shower before you come in for your surgery. Do not apply lotions, perfumes, deodorants, or nail polish.   ? ?? Do not shave the surgical site yourself.   ? ?? Take off all jewelry and piercings. And take out contact lenses, if you wear them.   ?At the hospital or surgery center   ?? Bring a picture ID.   ? ?? The area for surgery is often marked to make sure there are no errors.   ? ?? You will be kept comfortable and safe by your anesthesia provider. The anesthesia may make you sleep. Or it may just numb the area being worked on.   ? ?? You also will get antibiotics through the IV tube before surgery. This lowers the  risk of an infection of the incision.   ? ?? The surgery will take about 2 to 3 hours.   Going home   ?? Be sure you have someone to drive you home. Anesthesia and pain medicine make it unsafe for you to drive.   ? ?? You will be given more specific instructions about recovering from your surgery. They will cover things like diet, wound care, follow-up care, driving, and getting back to your normal routine.   When should you call your doctor?   ?? You have questions or concerns.   ? ?? You don't understand how to prepare for your surgery.   ? ?? You become ill before the surgery (such as fever, flu, or a cold).   ? ?? You need to reschedule or have changed your mind about having the surgery.   Where can you learn more?  Go to [email or web address] and sign in to your MyChart account. Enter F652 in the Search Health Information box to learn more about "Total Knee Replacement: Before Your Surgery."     If you do not have an account, please click on the "Sign Up Now" link.  Current as of: October 10, 2015  Content Version: 11.5  ?? 2006-2017 [employer]. Care instructions adapted under license by [facility]. If you have questions about a medical condition or this instruction, always ask your healthcare professional. Healthwise, Inc[employer] disclaims any warranty or liability for your use of this information.

## 2016-09-02 NOTE — Progress Notes (Signed)
Pre-op form and office note faxed to [facility].

## 2016-09-02 NOTE — Progress Notes (Signed)
Subjective:      Patient ID: Bradley Oconnell is a 61 y.o. male.    HPI  Pt here for pre-op physical at the request of Dr. Allyne Gee.  Scheduled to have Left total knee replacement on 09/23/2016 at [facility] for severe left knee arthritis.  Reviewed BMI of 32.   Encouraged diet, exercise and weight loss.  No previous anesthesia issues.  Pt has a normal functional capacity.  Denies chest pains and SOB.  No stress tests or heart caths in the last 5 years.  REviewed pre-op work up, all good except for positive MRSA.  Married, former smoker, pmh reviewed.     Review of Systems   Constitutional: Negative for chills, fatigue, fever and unexpected weight change.   HENT: Negative for congestion, ear pain, rhinorrhea and sore throat.    Eyes: Negative for pain and visual disturbance.   Respiratory: Negative for cough, chest tightness, shortness of breath and wheezing.    Cardiovascular: Negative for chest pain and palpitations.   Gastrointestinal: Negative for abdominal pain, blood in stool, constipation, diarrhea, nausea and vomiting.   Genitourinary: Negative for difficulty urinating, frequency, hematuria and urgency.   Musculoskeletal: Negative for back pain, joint swelling, myalgias and neck pain.        Left knee pain   Skin: Negative for rash.   Neurological: Negative for dizziness and headaches.   Hematological: Negative for adenopathy. Does not bruise/bleed easily.   Psychiatric/Behavioral: Negative for behavioral problems and sleep disturbance. The patient is not nervous/anxious.      Past Medical History:   Diagnosis Date   ??? Cervical pain (neck)    ??? Depression, recurrent ([place])    ??? ED (erectile dysfunction)    ??? Genital warts    ??? OA (osteoarthritis)    ??? Testosterone deficiency      Past Surgical History:   Procedure Laterality Date   ??? COLONOSCOPY      polyps   ??? FOOT SURGERY Bilateral     x 4   ??? FOOT SURGERY Left 07/19/2014   ??? HEMORRHOID SURGERY  2006   ??? HERNIA REPAIR  2010   ??? ROTATOR CUFF REPAIR Right    ??? UPPER  GASTROINTESTINAL ENDOSCOPY  04/15/13    DR. Ilsa Iha NO BX.     Allergies   Allergen Reactions   ??? Gabapentin Swelling       Current Outpatient Prescriptions:   ???  etodolac (LODINE XL) 600 MG extended release tablet, Take 600 mg by mouth daily, Disp: , Rfl:   ???  Biotin 1000 MCG TABS, Take by mouth, Disp: , Rfl:   ???  tranylcypromine (PARNATE) 10 MG tablet, Take 1 tablet by mouth 3 times daily, Disp: 270 tablet, Rfl: 1  ???  topiramate (TOPAMAX) 100 MG tablet, Take 1 tablet by mouth 3 times daily, Disp: 270 tablet, Rfl: 1  ???  finasteride (PROSCAR) 5 MG tablet, Take 1 tablet by mouth daily, Disp: 90 tablet, Rfl: 1  Social History     Social History   ??? Marital status: Married     Spouse name: N/A   ??? Number of children: N/A   ??? Years of education: N/A     Occupational History   ??? Not on file.     Social History Main Topics   ??? Smoking status: Former Smoker     Quit date: 06/23/2011   ??? Smokeless tobacco: Never Used   ??? Alcohol use Yes      Comment:  social   ??? Drug use: No   ??? Sexual activity: Yes     Other Topics Concern   ??? Not on file     Social History Narrative   ??? No narrative on file     Family History   Problem Relation Age of Onset   ??? Parkinsonism Mother    ??? Arthritis Mother    ??? Heart Disease Father      Congestive Heart   ??? Cancer Father    ??? Stroke Father    ??? Arthritis Father    ??? Depression Father    ??? Depression Son    ??? Early Death Son    ??? Asthma Neg Hx    ??? Birth Defects Neg Hx    ??? Diabetes Neg Hx    ??? Hearing Loss Neg Hx    ??? High Blood Pressure Neg Hx    ??? High Cholesterol Neg Hx    ??? Kidney Disease Neg Hx    ??? Learning Disabilities Neg Hx    ??? Mental Illness Neg Hx    ??? Mental Retardation Neg Hx    ??? Miscarriages / Stillbirths Neg Hx    ??? Substance Abuse Neg Hx    ??? Vision Loss Neg Hx    ??? Other Neg Hx        Objective:   Physical Exam   Constitutional: He is oriented to person, place, and time. He appears well-developed and well-nourished.   HENT:   Head: Normocephalic and atraumatic.   Right Ear:  External ear normal.   Left Ear: External ear normal.   Nose: Nose normal.   Mouth/Throat: Oropharynx is clear and moist.   Eyes: EOM are normal. Pupils are equal, round, and reactive to light.   Neck: Neck supple. Carotid bruit is not present. No thyromegaly present.   Cardiovascular: Normal rate, regular rhythm and normal heart sounds.    Pulmonary/Chest: Breath sounds normal. He has no wheezes. He has no rales.   Abdominal: Soft. Bowel sounds are normal. There is no tenderness. There is no rebound and no guarding.   Musculoskeletal:        Left knee: He exhibits decreased range of motion. Tenderness found.        Right lower leg: He exhibits edema.        Left lower leg: He exhibits edema.   Lymphadenopathy:     He has no cervical adenopathy.   Neurological: He is alert and oriented to person, place, and time. He has normal reflexes. No cranial nerve deficit.   Skin: Skin is warm and dry. No rash noted.   Psychiatric: He has a normal mood and affect.   Nursing note and vitals reviewed.    Vitals:    09/02/16 0917   BP: 116/68   Site: Right Arm   Position: Sitting   Cuff Size: Large Adult   Pulse: 72   Resp: 12   Weight: 202 lb 3.2 oz (91.7 kg)   Height: 5' 6.6" (1.692 m)     Immunization History   Administered Date(s) Administered   ??? Influenza Virus Vaccine 05/06/2016   ??? Influenza, Quadv, 3 Years and older, IM 05/03/2015   ??? Zoster 09/03/2011       Assessment:      Pre-op physical  Left knee OA  Recurrent depression          Plan:      After reviewing pre-op work up, he is cleared for  proposed surgery.  Note to DR. [place]  Electronically signed by Sheila Oats, MD on 09/02/2016 at 9:34 AM    Reviewed health maintenance  Mrsa treatment per OIO.

## 2016-09-09 ENCOUNTER — Telehealth

## 2016-09-09 NOTE — Telephone Encounter (Signed)
Ok to culture if the lab will do it.

## 2016-09-09 NOTE — Telephone Encounter (Signed)
Patient states that he is scheduled for surgery with Dr Allyne Gee and his pre surgical tests came back positive for MRSA.  He says that he developed a boil on his right side last Wednesday that is draining some pus.  He called Dr Misson's office but he is out of town all week.  He is asking if you could order a culture of the boil for him or do you need to see him?  He would like to have orders sent to [facility] on [address].  Call him back if any problems

## 2016-09-09 NOTE — Telephone Encounter (Signed)
Patient notified order faxed to [facility]

## 2016-09-10 ENCOUNTER — Ambulatory Visit: Admit: 2016-09-10 | Discharge: 2016-09-10 | Payer: PRIVATE HEALTH INSURANCE | Attending: Surgery

## 2016-09-10 DIAGNOSIS — L02419 Cutaneous abscess of limb, unspecified: Secondary | ICD-10-CM

## 2016-09-10 NOTE — Progress Notes (Signed)
Bradley Oconnell. Hasson Gaspard, [place]  General Surgery  Established Patient Evaluation in Office  Pt Name: Bradley Oconnell  Date of Birth May 01, 1956   Today's Date: 09/10/2016  Medical Record Number: [id number]  Referring Provider: No ref. provider found  Primary Care Provider: Sheila Oats, MD  Chief Complaint   Patient presents with   ??? Surgical Consult     est pt-has boil wants lanced-     ASSESSMENT      1. Abscess of hip  Anaerobic and Aerobic Culture     Past Medical History:   Diagnosis Date   ??? Cervical pain (neck)    ??? Depression, recurrent ([place])    ??? ED (erectile dysfunction)    ??? Genital warts    ??? OA (osteoarthritis)    ??? Testosterone deficiency       PLANS      Orders Placed This Encounter   Procedures   ??? Anaerobic and Aerobic Culture   ??    ?? The risks complications and benefits of the procedure where discussed with the patient including but not limited to bleeding, infection, open wound and recurrence. The patient was given an opportunity to ask questions. Once answered, the patient agreed to undergo the procedure.  ?? Office I&D performed today  ?? Local wound care  ?? Already on antibiotics follow up as needed     SUBJECTIVE      Bradley Oconnell is a 61 y.o. male seen in the consultation for evaluation regarding cutaneous abscess. Lesion is located in the right hip. It began 1 week ago and has gradually improved. Symptoms include discharge, redness, swelling and pain.  He does not have previous history of cutaneous abscesses. He does not have diabetes, immunosuppression.   Past Medical History  Past Medical History:   Diagnosis Date   ??? Cervical pain (neck)    ??? Depression, recurrent ([place])    ??? ED (erectile dysfunction)    ??? Genital warts    ??? OA (osteoarthritis)    ??? Testosterone deficiency      Past Surgical History  Past Surgical History:   Procedure Laterality Date   ??? COLONOSCOPY      polyps   ??? FOOT SURGERY Bilateral     x 4   ??? FOOT SURGERY Left 07/19/2014   ??? HEMORRHOID SURGERY  2006   ??? HERNIA REPAIR  2010    ??? ROTATOR CUFF REPAIR Right    ??? UPPER GASTROINTESTINAL ENDOSCOPY  04/15/13    DR. Ilsa Iha NO BX.     Medications  Current Outpatient Prescriptions   Medication Sig Dispense Refill   ??? topiramate (TOPAMAX) 100 MG tablet Take 1 tablet by mouth 3 times daily 270 tablet 3   ??? tranylcypromine (PARNATE) 10 MG tablet Take 1 tablet by mouth 3 times daily 270 tablet 3   ??? etodolac (LODINE XL) 600 MG extended release tablet Take 1 tablet by mouth 2 times daily 180 tablet 3   ??? Biotin 1000 MCG TABS Take by mouth     ??? finasteride (PROSCAR) 5 MG tablet Take 1 tablet by mouth daily 90 tablet 1     No current facility-administered medications for this visit.      Allergies  is allergic to gabapentin.  Family History  family history includes Arthritis in his father and mother; Cancer in his father; Depression in his father and son; Early Death in his son; Heart Disease in his father; Parkinsonism in his mother; Stroke in his  father.  Social History   reports that he quit smoking about 5 years ago. He has never used smokeless tobacco. He reports that he drinks alcohol. He reports that he does not use drugs.  Health Screening Exams  Health Maintenance   Topic Date Due   ??? DTaP/Tdap/Td vaccine (1 - Tdap) 07/05/1973   ??? Lipid screen  05/22/2017   ??? Colon cancer screen colonoscopy  04/16/2023   ??? Zostavax vaccine  Completed   ??? Flu vaccine  Completed   ??? Hepatitis C screen  Completed   ??? HIV screen  Completed     Review of Systems  Constitutional: Negative for chills, fatigue, fever and unexpected weight change.   HENT: Negative for congestion, ear pain, rhinorrhea and sore throat.    Eyes: Negative for pain and visual disturbance.   Respiratory: Negative for cough, chest tightness, shortness of breath and wheezing.    Cardiovascular: Negative for chest pain and palpitations.   Gastrointestinal: Negative for abdominal pain, blood in stool, constipation, diarrhea, nausea and vomiting.   Genitourinary: Negative for difficulty urinating,  frequency, hematuria and urgency.   Musculoskeletal: Negative for back pain, joint swelling, myalgias and neck pain.        Left knee pain   Skin: Negative for rash. + boil right hip  Neurological: Negative for dizziness and headaches.   Hematological: Negative for adenopathy. Does not bruise/bleed easily.   Psychiatric/Behavioral: Negative for behavioral problems and sleep disturbance. The patient is not nervous/anxious.      OBJECTIVE    VITALS:  height is 5\' 7"  (1.702 m) and weight is 202 lb (91.6 kg). His tympanic temperature is 97.5 ??F (36.4 ??C). His blood pressure is 122/70 and his pulse is 84. His respiration is 18 and oxygen saturation is 99%.  Pain Score:   0 - No pain  CONSTITUTIONAL: Alert and oriented times 3, no acute distress and cooperative to examination with proper mood and affect.  SKIN: Skin color, texture, turgor normal. There is a fluctuant, tender 1.5 cm lesion located on the left hip.  Consistent with abscess  LYMPH: no cervical nodes, no inguinal nodes  HEENT: Head is normocephalic, atraumatic. EOMI, PERRLA.  NECK: Supple, symmetrical, trachea midline, no adenopathy, thyroid symmetric, not enlarged and no tenderness, skin normal.  CHEST/LUNGS: chest symmetric with normal A/P diameter, normal respiratory rate and rhythm, lungs clear to auscultation without wheezes, rales or rhonchi. No accessory muscle use. Scars None   CARDIOVASCULAR: Heart sounds are normal.  Regular rate and rhythm without murmur, gallop or rub. Normal S1 and S2. Carotid and femoral pulses 2+/4 and equal bilaterally.  ABDOMEN: Normal shape. hernia scar(s) present. Normal bowel sounds.  No bruits. soft, nontender, nondistended, no masses or organomegaly. no evidence of hernia. Percussion: Normal without hepatosplenomegally.   RECTAL: deferred, not clinically indicated  NEUROLOGIC: There are no focalizing motor or sensory deficits. CN II-XII are grossly intact.Marland Kitchen   EXTREMITIES: no cyanosis, no clubbing and no edema.         Abscess/Hematoma Drainage Procedure Note    Indication: left hip abscess   Procedure: The patient was placed in the appropriate position and the skin was prepped and draped in sterile fashion. There are infiltrated with local anesthetic. Using an #11 scalpel blade, the wound was opened, a small amount of pustulent fluid was removed and sent for culture. This did not appear to be extended to the fascia level. The wound was copiously irrigated and sterile dressings were applied.  The patient  tolerated the procedure well.  Complications: None    Thank you for the interesting evaluation. Further recommendations as listed above.       Electronically signed by Wendee Copp, DO on 09/10/2016 at 12:01 PM

## 2016-09-19 LAB — CULTURE, ANAEROBIC AND AEROBIC

## 2016-09-27 ENCOUNTER — Inpatient Hospital Stay: Payer: PRIVATE HEALTH INSURANCE

## 2016-09-27 DIAGNOSIS — Z22322 Carrier or suspected carrier of Methicillin resistant Staphylococcus aureus: Secondary | ICD-10-CM

## 2016-09-28 LAB — CULTURE, MRSA, SCREENING

## 2016-11-19 ENCOUNTER — Telehealth

## 2016-11-19 MED ORDER — CELECOXIB 200 MG PO CAPS
200 MG | ORAL_CAPSULE | Freq: Every day | ORAL | 2 refills | Status: DC
Start: 2016-11-19 — End: 2016-11-27

## 2016-11-19 NOTE — Telephone Encounter (Signed)
ep'ed celebrex to [place], cancelled etodolac rx.    [place] for audiology referral for decreased hearing.

## 2016-11-19 NOTE — Telephone Encounter (Signed)
Referral inputted in epic and I did speak with Audiology and they already contacted the pt.

## 2016-11-19 NOTE — Telephone Encounter (Signed)
T/C Bradley Oconnell [phone] - called for 2 things.  #1 - has been having trouble with his hearing.  Would like order to have hearing checked at [facility].  2nd, he has always taken Lodine for arthritis.  He had total knee 3 weeks ago and they gave him Celebrex which is working much better for him.  He is asking for RX of Celebrex to [facility] at [address].  Will check pharm.

## 2016-11-26 ENCOUNTER — Telehealth

## 2016-11-26 NOTE — Telephone Encounter (Signed)
T/C Bradley Oconnell - said he called and we filled the Celebrex RX for him.  However he realized that he had been taking 400 mg and not 200.  He did pick up the RX at [facility] but it is really only 1/2 the dose and he will run short in the 90 days.  He is asking if we can get this corrected with the pharmacy.  He states he realizes he will probably need BW more often due to the dosage.  Can call if at [phone] if needed.

## 2016-11-27 ENCOUNTER — Inpatient Hospital Stay: Admit: 2016-11-27 | Discharge: 2016-11-27 | Payer: PRIVATE HEALTH INSURANCE | Attending: Audiologist

## 2016-11-27 DIAGNOSIS — H903 Sensorineural hearing loss, bilateral: Secondary | ICD-10-CM

## 2016-11-27 MED ORDER — CELECOXIB 200 MG PO CAPS
200 | ORAL_CAPSULE | Freq: Two times a day (BID) | ORAL | 2 refills | Status: DC
Start: 2016-11-27 — End: 2017-12-04

## 2016-11-27 NOTE — Progress Notes (Signed)
ACCOUNT #:  [id number]      AUDIOLOGICAL EVALUATION      REASON FOR TESTING:   Patient states possible hearing loss, bilaterally.    OTOSCOPY:  WNL, bilaterally.     AUDIOGRAM            PURE TONES     RE    LE     [x]    [x]  WNL        [x]    [x]  Mild    []    [x]  Moderate       []    []  Mod-Severe   []    []  Severe    []    []  Profound    TYPE     RE    LE    [x]    [x]  SNHL    []    []   Conductive HL    []    []  Mixed HL      CONFIGURATION    RE    LE    []    []  Essentially Flat     [x]    [x]   Sloping  []    []  Steeply Sloping  []    []   Rising  []    []  Cookie Bite    SPEECH AUDIOMETRY   Right Left Sound Field Aided   PTA 8 2     SRT 5 5     SAT       MASKING       % WRS   QUIET 96 96      40sl 40sl     %WRS   NOISE              MCL       UCL            Live Voice  [x]      Recorded  []      List   []      WORD RECOGNITION   RE    LE  [x]    [x]   Excellent    []    []   Good  []    []  Fair  []    []  Poor  []    []  Very Poor    TYMPANOGRAMS  RE    LE  [x]    [x]   WNL    []    []   WNL w/reduced mobility  []    []  WNL w/hyper mobility  []    []  Negative pressure  []    []  Flat w/normal ECV  []    []  Flat w/large ECV  []    []  Patent PE tube  []    []  Non-Patent PE tube  []    []  Could Not Test    DISTORTION PRODUCT OTOACOUSTIC EMISSIONS SCREENING    Right Ear     []  Passed     []  Refer     [x]  Did Not Test  Left Ear        []  Passed     []  Refer     [x]  Did Not Test      COMMENTS:  NA      RECOMMENDATION(S):    1.)  Amplification could be pursued, bilaterally, should the patient be interested.

## 2016-11-27 NOTE — Telephone Encounter (Signed)
Will do BID, if it is a PA, will not complete since using it off label.  ep'ed to [facility]

## 2016-11-27 NOTE — Telephone Encounter (Signed)
Pt notified of [facility]'s response and states Dr Allyne Gee wrote the rx for 200mg  bid for OA.  The pharmaceuticals recommend 200mg  bid for RA - he has tested negative for RA but this dosing makes his pain "minimal."  Pt is willing to get blood work q 6 months or prn.    Please advise.

## 2016-11-27 NOTE — Telephone Encounter (Signed)
200 mg daily is the recommended dosage and I have no past scripts that were written BID, all were daily.

## 2016-11-27 NOTE — Telephone Encounter (Signed)
Left message for pt regarding this information.

## 2016-11-28 NOTE — Telephone Encounter (Signed)
Patient is requesting to speak with you about hearing aids.  Please call him at [phone].

## 2016-12-06 NOTE — Telephone Encounter (Signed)
Patient is scheduled 12-09-16 for the hearing aid fitting.

## 2016-12-06 NOTE — Telephone Encounter (Signed)
Please contact the patient to schedule a hearing aid fitting. Register self pay. Thanks!!

## 2016-12-09 ENCOUNTER — Inpatient Hospital Stay: Admit: 2016-12-09 | Discharge: 2016-12-09 | Payer: PRIVATE HEALTH INSURANCE | Attending: Audiologist

## 2016-12-09 DIAGNOSIS — H903 Sensorineural hearing loss, bilateral: Secondary | ICD-10-CM

## 2016-12-09 NOTE — Progress Notes (Signed)
ACCOUNT #:  [id number]    DIAGNOSIS:  Bilateral sensorineural hearing loss    NEW HEARING AID FITTING: Dispensed Starkey Muse iQ i2000 hearing aid(s) for both ear(s) with charger.  Explained care, use and insertion.  Programmed.  Two week check scheduled for June 4th.

## 2016-12-23 ENCOUNTER — Inpatient Hospital Stay: Admit: 2016-12-23 | Discharge: 2016-12-23 | Payer: PRIVATE HEALTH INSURANCE | Attending: Audiologist

## 2016-12-23 NOTE — Progress Notes (Signed)
2 WEEK HEARING AID CHECK:  Patient likes hearing aids though had an issue with loud sounds and hearing in noise. Adjustments made, bilaterally. Will see in two weeks for another follow up appointment.

## 2016-12-31 ENCOUNTER — Ambulatory Visit: Admit: 2016-12-31 | Discharge: 2016-12-31 | Payer: PRIVATE HEALTH INSURANCE | Attending: Family Medicine

## 2016-12-31 DIAGNOSIS — G479 Sleep disorder, unspecified: Secondary | ICD-10-CM

## 2016-12-31 MED ORDER — QUETIAPINE FUMARATE 25 MG PO TABS
25 MG | ORAL_TABLET | Freq: Every day | ORAL | 1 refills | Status: DC | PRN
Start: 2016-12-31 — End: 2017-02-15

## 2016-12-31 MED ORDER — MODAFINIL 200 MG PO TABS
200 MG | ORAL_TABLET | Freq: Every day | ORAL | 1 refills | Status: AC
Start: 2016-12-31 — End: 2017-01-30

## 2016-12-31 NOTE — Progress Notes (Signed)
Subjective:      Patient ID: Bradley Oconnell is a 61 y.o. male.    HPI  Pt here for a check up.  Reviewed BMI of 32.  Encouraged diet, exercise and weight loss.  Requests rx of seroquel he takes as needed for sleep.  Also requests rx for provigil as needed for wakefulness.  Had a recent knee replacement.  Partnered, former smoker, pmh reviewed.    Review of Systems   Constitutional: Positive for fatigue. Negative for chills, fever and unexpected weight change.   HENT: Negative for congestion, ear pain, rhinorrhea and sore throat.    Eyes: Negative for pain and visual disturbance.   Respiratory: Negative for cough, chest tightness, shortness of breath and wheezing.    Cardiovascular: Negative for chest pain and palpitations.   Gastrointestinal: Negative for abdominal pain, blood in stool, constipation, diarrhea, nausea and vomiting.   Genitourinary: Negative for difficulty urinating, frequency, hematuria and urgency.   Musculoskeletal: Negative for back pain, joint swelling, myalgias and neck pain.   Skin: Negative for rash.   Neurological: Negative for dizziness and headaches.   Hematological: Negative for adenopathy. Does not bruise/bleed easily.   Psychiatric/Behavioral: Positive for sleep disturbance. Negative for behavioral problems. The patient is not nervous/anxious.        Objective:   Physical Exam   Constitutional: He is oriented to person, place, and time. He appears well-developed and well-nourished.   HENT:   Head: Normocephalic and atraumatic.   Right Ear: External ear normal.   Left Ear: External ear normal.   Nose: Nose normal.   Mouth/Throat: Oropharynx is clear and moist.   Eyes: EOM are normal. Pupils are equal, round, and reactive to light.   Neck: Neck supple. Carotid bruit is not present. No thyromegaly present.   Cardiovascular: Normal rate, regular rhythm and normal heart sounds.    Pulmonary/Chest: Breath sounds normal. He has no wheezes. He has no rales.   Abdominal: Soft. Bowel sounds are  normal. There is no tenderness. There is no rebound and no guarding.   Musculoskeletal: Normal range of motion. He exhibits no edema.   Lymphadenopathy:     He has no cervical adenopathy.   Neurological: He is alert and oriented to person, place, and time. He has normal reflexes. No cranial nerve deficit.   Skin: Skin is warm and dry. No rash noted.   Psychiatric: He has a normal mood and affect.   Nursing note and vitals reviewed.      Assessment:      Sleep disturbance  wakefulness      Plan:      Refill meds  Encouraged diet, exercise and weight loss.  Dash diet    Controlled Substances Monitoring:     RX Monitoring 12/31/2016   Attestation The Prescription Monitoring Report for this patient was reviewed today.   Documentation Possible medication side effects, risk of tolerance/dependence & alternative treatments discussed.;No signs of potential drug abuse or diversion identified.

## 2017-01-06 ENCOUNTER — Encounter: Attending: Audiologist

## 2017-01-08 ENCOUNTER — Inpatient Hospital Stay: Admit: 2017-01-08 | Discharge: 2017-01-08 | Payer: PRIVATE HEALTH INSURANCE | Attending: Audiologist

## 2017-01-08 NOTE — Progress Notes (Signed)
[place] CHECK:  Added VC and programs (restaurant/crowd). Patient happy with results. Will see in six months or sooner should problems be noted.

## 2017-02-15 ENCOUNTER — Encounter

## 2017-02-17 MED ORDER — QUETIAPINE FUMARATE 25 MG PO TABS
25 MG | ORAL_TABLET | ORAL | 1 refills | Status: DC
Start: 2017-02-17 — End: 2017-05-17

## 2017-02-17 NOTE — Telephone Encounter (Signed)
ep'ed seroquel to pharm requested

## 2017-02-17 NOTE — Telephone Encounter (Signed)
Bradley Oconnell needs refill of   Requested Prescriptions     Pending Prescriptions Disp Refills   . QUEtiapine (SEROQUEL) 25 MG tablet [Pharmacy Med Name: QUETIAPINE FUMARATE 25MG  TABS] 30 tablet 0     Sig: TAKE 1 TABLET BY MOUTH ONE TIME A DAY AS NEEDED FOR SLEEP       Last Filled on:  12/31/2016    Last Visit Date:  12/31/2016    Next Visit Date:    Visit date not found

## 2017-04-02 ENCOUNTER — Inpatient Hospital Stay: Admit: 2017-04-02 | Discharge: 2017-04-02 | Payer: PRIVATE HEALTH INSURANCE | Attending: Audiologist

## 2017-04-02 NOTE — Progress Notes (Signed)
Hearing aid adjustment:  Patient reported hearing difficulty in background noise.  He stated that the noise memories don't help.  He was not aware he had a VC control on his right ear.  Demonstrated the VC and told patient to turn it down when he is in a noisy environment.  Also gave patient extra domes (med.open).  Patient stated that he really likes the hearing aids and feels he hears very well the majority of the time.

## 2017-05-17 ENCOUNTER — Encounter

## 2017-05-19 ENCOUNTER — Encounter: Admit: 2017-05-19 | Discharge: 2017-05-19 | Payer: PRIVATE HEALTH INSURANCE

## 2017-05-19 DIAGNOSIS — Z23 Encounter for immunization: Secondary | ICD-10-CM

## 2017-05-19 NOTE — Progress Notes (Signed)
After obtaining consent, and per orders of Dr. Manfred Shirts, injection of Influenza vaccine 0.5 mL IM left deltoid given by Sander Radon. Patient tolerated well.    After obtaining consent, and per orders of Dr. Manfred Shirts, injection of Pneumovax 23 0.5 mL IM right deltoid given by Sander Radon. Patient tolerated well.    Immunizations     Name Date Dose Route    Influenza, [place], 3 Years and older, IM (Fluzone 3 yrs and older or Afluria 5 yrs and older) 05/19/2017 0.5 mL Intramuscular    Site: Deltoid- Left    Lot: [id number]    NDC: 29562-130-86    Pneumococcal Polysaccharide (Pneumovax23) 05/19/2017 0.5 mL Intramuscular    Site: Deltoid- Right    Lot: [id number]    NDC: 2952-8413-24

## 2017-05-21 MED ORDER — QUETIAPINE FUMARATE 25 MG PO TABS
25 MG | ORAL_TABLET | ORAL | 1 refills | Status: DC
Start: 2017-05-21 — End: 2017-12-04

## 2017-05-21 NOTE — Telephone Encounter (Signed)
ep'ed seroquel to pharm requested

## 2017-07-09 ENCOUNTER — Inpatient Hospital Stay: Admit: 2017-07-09 | Discharge: 2017-07-09 | Attending: Audiologist

## 2017-07-09 NOTE — Progress Notes (Signed)
Six month check:  No problems reported.  Patient is doing well with the hearing aids.

## 2017-12-04 ENCOUNTER — Ambulatory Visit: Admit: 2017-12-04 | Discharge: 2017-12-04 | Payer: MEDICARE | Attending: Family Medicine

## 2017-12-04 ENCOUNTER — Encounter

## 2017-12-04 ENCOUNTER — Inpatient Hospital Stay: Admit: 2017-12-04 | Payer: MEDICARE

## 2017-12-04 ENCOUNTER — Inpatient Hospital Stay: Payer: MEDICARE

## 2017-12-04 DIAGNOSIS — M25551 Pain in right hip: Secondary | ICD-10-CM

## 2017-12-04 DIAGNOSIS — Z0000 Encounter for general adult medical examination without abnormal findings: Secondary | ICD-10-CM

## 2017-12-04 DIAGNOSIS — M545 Low back pain, unspecified: Secondary | ICD-10-CM

## 2017-12-04 LAB — COMPREHENSIVE METABOLIC PANEL
ALT: 13 U/L (ref 11–66)
AST: 20 U/L (ref 5–40)
Albumin: 4 g/dL (ref 3.5–5.1)
Alkaline Phosphatase: 64 U/L (ref 38–126)
BUN: 20 mg/dL (ref 7–22)
CO2: 21 meq/L — ABNORMAL LOW (ref 23–33)
Calcium: 9 mg/dL (ref 8.5–10.5)
Chloride: 106 meq/L (ref 98–111)
Creatinine: 0.9 mg/dL (ref 0.4–1.2)
Glucose: 80 mg/dL (ref 70–108)
Potassium: 4.3 meq/L (ref 3.5–5.2)
Sodium: 140 meq/L (ref 135–145)
Total Bilirubin: 0.3 mg/dL (ref 0.3–1.2)
Total Protein: 6.9 g/dL (ref 6.1–8.0)

## 2017-12-04 LAB — CBC WITH AUTO DIFFERENTIAL
Basophils Absolute: 0 10*3/uL (ref 0.0–0.1)
Basophils: 0.2 %
Eosinophils Absolute: 0.3 10*3/uL (ref 0.0–0.4)
Eosinophils: 4.8 %
Hematocrit: 43.7 % (ref 42.0–52.0)
Hemoglobin: 14.4 gm/dl (ref 14.0–18.0)
Immature Grans (Abs): 0.02 10*3/uL (ref 0.00–0.07)
Immature Granulocytes: 0.4 %
Lymphocytes Absolute: 1 10*3/uL (ref 1.0–4.8)
Lymphocytes: 18.2 %
MCH: 32.1 pg (ref 26.0–33.0)
MCHC: 33 gm/dl (ref 32.2–35.5)
MCV: 97.3 fL — ABNORMAL HIGH (ref 80.0–94.0)
MPV: 10.7 fL (ref 9.4–12.4)
Monocytes Absolute: 0.5 10*3/uL (ref 0.4–1.3)
Monocytes: 9.7 %
Platelets: 210 10*3/uL (ref 130–400)
RBC: 4.49 10*6/uL — ABNORMAL LOW (ref 4.70–6.10)
RDW-CV: 12.9 % (ref 11.5–14.5)
RDW-SD: 46.4 fL — ABNORMAL HIGH (ref 35.0–45.0)
Seg Neutrophils: 66.7 %
Segs Absolute: 3.6 10*3/uL (ref 1.8–7.7)
WBC: 5.4 10*3/uL (ref 4.8–10.8)
nRBC: 0 /100 wbc

## 2017-12-04 LAB — PSA PROSTATIC SPECIFIC ANTIGEN: PSA: 0.25 ng/mL (ref 0.00–1.00)

## 2017-12-04 LAB — LIPID PANEL
Cholesterol, Total: 179 mg/dL (ref 100–199)
HDL: 66 mg/dL
LDL Calculated: 103 mg/dL
Triglycerides: 48 mg/dL (ref 0–199)

## 2017-12-04 LAB — ANION GAP: Anion Gap: 13 meq/L (ref 8.0–16.0)

## 2017-12-04 LAB — GLOMERULAR FILTRATION RATE, ESTIMATED: Est, Glom Filt Rate: 85 mL/min/{1.73_m2} — AB

## 2017-12-04 MED ORDER — QUETIAPINE FUMARATE 25 MG PO TABS
25 MG | ORAL_TABLET | ORAL | 2 refills | Status: DC
Start: 2017-12-04 — End: 2018-04-22

## 2017-12-04 MED ORDER — HANDICAP PLACARD
0 refills | Status: AC
Start: 2017-12-04 — End: ?

## 2017-12-04 MED ORDER — ETODOLAC ER 600 MG PO TB24
600 MG | ORAL_TABLET | Freq: Two times a day (BID) | ORAL | 3 refills | Status: DC
Start: 2017-12-04 — End: 2018-04-22

## 2017-12-04 MED ORDER — ETODOLAC ER 600 MG PO TB24
600 MG | ORAL_TABLET | Freq: Two times a day (BID) | ORAL | 3 refills | Status: DC
Start: 2017-12-04 — End: 2017-12-04

## 2017-12-04 MED ORDER — KETOROLAC TROMETHAMINE 10 MG PO TABS
10 MG | ORAL_TABLET | Freq: Four times a day (QID) | ORAL | 0 refills | Status: DC | PRN
Start: 2017-12-04 — End: 2019-03-30

## 2017-12-04 NOTE — Telephone Encounter (Signed)
Patient states that when he went to pick up his medications at [facility], they were out of the [place].  He did some investigating and found out that it is cheaper for him to get it through his mail away, [facility].  Can you send it in to them instead?

## 2017-12-04 NOTE — Telephone Encounter (Signed)
ep'ed lodine to pharm requested

## 2017-12-04 NOTE — Progress Notes (Signed)
Blood work drawn today in the office, venous puncture, right hand, pt tolerated well.

## 2017-12-04 NOTE — Progress Notes (Signed)
Subjective:      Patient ID: Bradley Oconnell is a 62 y.o. male.    HPI  Pt here for annual physical exam and med refills.  Needs fasting blood work.  REviewed BMI of 33.  Encouraged diet, exercise and weight loss.  Reviewed health maintenance.  Is feeling well except for right hip pain x 3 to 4 weeks.  It is painful with weight bearing and leading with the right leg.  Partnered, former smoker, pmh reviewed.     Review of Systems   Constitutional: Negative for chills, fatigue, fever and unexpected weight change.   HENT: Negative for congestion, ear pain, rhinorrhea and sore throat.    Eyes: Negative for pain and visual disturbance.   Respiratory: Negative for cough, chest tightness, shortness of breath and wheezing.    Cardiovascular: Negative for chest pain and palpitations.   Gastrointestinal: Negative for abdominal pain, blood in stool, constipation, diarrhea, nausea and vomiting.   Genitourinary: Negative for difficulty urinating, frequency, hematuria and urgency.   Musculoskeletal: Negative for back pain, joint swelling, myalgias and neck pain.        Right hip pain   Skin: Negative for rash.   Neurological: Negative for dizziness and headaches.   Hematological: Negative for adenopathy. Does not bruise/bleed easily.   Psychiatric/Behavioral: Negative for behavioral problems and sleep disturbance. The patient is not nervous/anxious.        Objective:   Physical Exam   Constitutional: He is oriented to person, place, and time. He appears well-developed and well-nourished.   HENT:   Head: Normocephalic and atraumatic.   Right Ear: External ear normal.   Left Ear: External ear normal.   Nose: Nose normal.   Mouth/Throat: Oropharynx is clear and moist.   Eyes: Pupils are equal, round, and reactive to light. EOM are normal.   Neck: Neck supple. Carotid bruit is not present. No thyromegaly present.   Cardiovascular: Normal rate, regular rhythm and normal heart sounds.   Pulmonary/Chest: Breath sounds normal. He has no  wheezes. He has no rales.   Abdominal: Soft. Bowel sounds are normal. There is no tenderness. There is no rebound and no guarding.   Musculoskeletal: Normal range of motion. He exhibits no edema.        Right hip: He exhibits tenderness. He exhibits normal range of motion.        Legs:  Lymphadenopathy:     He has no cervical adenopathy.   Neurological: He is alert and oriented to person, place, and time. He has normal reflexes. No cranial nerve deficit.   Skin: Skin is warm and dry. No rash noted.   Psychiatric: He has a normal mood and affect.   Nursing note and vitals reviewed.    Health Maintenance   Topic Date Due   ??? DTaP/Tdap/Td vaccine (1 - Tdap) 07/05/1973   ??? Shingles Vaccine (2 of 3) 10/29/2011   ??? Lipid screen  05/22/2017   ??? Colon cancer screen colonoscopy  04/16/2023   ??? Flu vaccine  Completed   ??? Hepatitis C screen  Completed   ??? HIV screen  Completed   ??? Pneumococcal 0-64 years Vaccine  Aged Out     Lab Results   Component Value Date    CHOL 174 05/22/2012    CHOL 173 02/26/2010     Lab Results   Component Value Date    TRIG 76 05/22/2012    TRIG 79 02/26/2010     Lab Results   Component  Value Date    HDL 75 05/22/2012    HDL 62 02/26/2010     Lab Results   Component Value Date    LDLCALC 84 05/22/2012    [place] 95 02/26/2010     No results found for: LABVLDL, VLDL  No results found for: [facility]  Lab Results   Component Value Date    NA 140 08/26/2016    K 3.8 08/26/2016    CL 102 08/26/2016    CO2 25 08/26/2016    BUN 19 08/26/2016    CREATININE 1.0 08/26/2016    GLUCOSE 89 08/26/2016    CALCIUM 8.9 08/26/2016    PROT 7.5 08/26/2016    LABALBU 4.6 08/26/2016    BILITOT 0.6 08/26/2016    ALKPHOS 75 08/26/2016    AST 23 08/26/2016    ALT 16 08/26/2016    LABGLOM 76 (A) 08/26/2016         Assessment:      Annual physical exam  Right hip pain      Plan:      Refillmeds  Fasting blood work  Encouraged diet, exercise and weight loss.  Dash diet  Reviewed health maintenance  Right hip series  LS  spine series    Shanon Brow received counseling on the following healthy behaviors: nutrition, exercise and medication adherence    Patient given educational materials on Nutrition and Exercise    I have instructed Makoto to complete a self tracking handout on Weights and instructed them to bring it with them to his next appointment.     Discussed use, benefit, and side effects of prescribed medications.  Barriers to medication compliance addressed.  All patient questions answered.  Pt voiced understanding.           Sheila Oats, MD

## 2017-12-04 NOTE — Patient Instructions (Signed)
Patient Education        Well Visit, Men 50 to 65: Care Instructions  Your Care Instructions    Physical exams can help you stay healthy. Your doctor has checked your overall health and may have suggested ways to take good care of yourself. He or she also may have recommended tests. At home, you can help prevent illness with healthy eating, regular exercise, and other steps.  Follow-up care is a key part of your treatment and safety. Be sure to make and go to all appointments, and call your doctor if you are having problems. It's also a good idea to know your test results and keep a list of the medicines you take.  How can you care for yourself at home?  ?? Reach and stay at a healthy weight. This will lower your risk for many problems, such as obesity, diabetes, heart disease, and high blood pressure.  ?? Get at least 30 minutes of exercise on most days of the week. Walking is a good choice. You also may want to do other activities, such as running, swimming, cycling, or playing tennis or team sports.  ?? Do not smoke. Smoking can make health problems worse. If you need help quitting, talk to your doctor about stop-smoking programs and medicines. These can increase your chances of quitting for good.  ?? Protect your skin from too much sun. When you're outdoors from 10 a.m. to 4 p.m., stay in the shade or cover up with clothing and a hat with a wide brim. Wear sunglasses that block UV rays. Even when it's cloudy, put broad-spectrum sunscreen (SPF 30 or higher) on any exposed skin.  ?? See a dentist one or two times a year for checkups and to have your teeth cleaned.  ?? Wear a seat belt in the car.  Follow your doctor's advice about when to have certain tests. These tests can spot problems early.  ?? Cholesterol. Your doctor will tell you how often to have this done based on your overall health and other things that can increase your risk for heart attack and stroke.  ?? Blood pressure. Have your blood pressure checked  during a routine doctor visit. Your doctor will tell you how often to check your blood pressure based on your age, your blood pressure results, and other factors.  ?? Prostate exam. Talk to your doctor about whether you should have a blood test (called a PSA test) for prostate cancer. Experts recommend that you discuss the benefits and risks of the test with your doctor before you decide whether to have this test.  ?? Diabetes. Ask your doctor whether you should have tests for diabetes.  ?? Vision. Some experts recommend that you have yearly exams for glaucoma and other age-related eye problems starting at age 50.  ?? Hearing. Tell your doctor if you notice any change in your hearing. You can have tests to find out how well you hear.  ?? Colorectal cancer. Your risk for colorectal cancer gets higher as you get older. Some experts say that adults should start regular screening at age 50 and stop at age 75. Others say to start before age 50 or continue after age 75. Talk with your doctor about your risk and when to start and stop screening.  ?? Heart attack and stroke risk. At least every 4 to 6 years, you should have your risk for heart attack and stroke assessed. Your doctor uses factors such as your age, blood pressure, cholesterol,   and whether you smoke or have diabetes to show what your risk for a heart attack or stroke is over the next 10 years.  ?? Abdominal aortic aneurysm. Ask your doctor whether you should have a test to check for an aneurysm. You may need a test if you ever smoked or if your parent, brother, sister, or child has had an aneurysm.  When should you call for help?  Watch closely for changes in your health, and be sure to contact your doctor if you have any problems or symptoms that concern you.  Where can you learn more?  Go to [email or web address] and sign in to your MyChart account. Enter [phone] in the [place] box to learn more about "Well Visit, Men 42 to 53: Care  Instructions."     If you do not have an account, please click on the "Sign Up Now" link.  Current as of: July 03, 2017  Content Version: 12.0  ?? 2006-2019 Healthwise, Incorporated. Care instructions adapted under license by [facility]. If you have questions about a medical condition or this instruction, always ask your healthcare professional. [place] any warranty or liability for your use of this information.         Patient Education        Hip Pain: Care Instructions  Your Care Instructions    Hip pain may be caused by many things, including overuse, a fall, or a twisting movement. Another cause of hip pain is arthritis. Your pain may increase when you stand up, walk, or squat. The pain may come and go or may be constant.  Home treatment can help relieve hip pain, swelling, and stiffness. If your pain is ongoing, you may need more tests and treatment.  Follow-up care is a key part of your treatment and safety. Be sure to make and go to all appointments, and call your doctor if you are having problems. It's also a good idea to know your test results and keep a list of the medicines you take.  How can you care for yourself at home?  ?? Take pain medicines exactly as directed.  ? If the doctor gave you a prescription medicine for pain, take it as prescribed.  ? If you are not taking a prescription pain medicine, ask your doctor if you can take an over-the-counter medicine.  ?? Rest and protect your hip. Take a break from any activity, including standing or walking, that may cause pain.  ?? Put ice or a cold pack against your hip for 10 to 20 minutes at a time. Try to do this every 1 to 2 hours for the next 3 days (when you are awake) or until the swelling goes down. Put a thin cloth between the ice and your skin.  ?? Sleep on your healthy side with a pillow between your knees, or sleep on your back with pillows under your knees.  ?? If there is no swelling, you can put moist heat, a  heating pad, or a warm cloth on your hip. Do gentle stretching exercises to help keep your hip flexible.  ?? Learn how to prevent falls. Have your vision and hearing checked regularly. Wear slippers or shoes with a nonskid sole.  ?? Stay at a healthy weight.  ?? Wear comfortable shoes.  When should you call for help?  Call 911 anytime you think you may need emergency care. For example, call if:  ?? ?? You have sudden chest pain and shortness  of breath, or you cough up blood.   ?? ?? You are not able to stand or walk or bear weight.   ?? ?? Your buttocks, legs, or feet feel numb or tingly.   ?? ?? Your leg or foot is cool or pale or changes color.   ?? ?? You have severe pain.   ??Call your doctor now or seek immediate medical care if:  ?? ?? You have signs of infection, such as:  ? Increased pain, swelling, warmth, or redness in the hip area.  ? Red streaks leading from the hip area.  ? Pus draining from the hip area.  ? A fever.   ?? ?? You have signs of a blood clot, such as:  ? Pain in your calf, back of the knee, thigh, or groin.  ? Redness and swelling in your leg or groin.   ?? ?? You are not able to bend, straighten, or move your leg normally.   ?? ?? You have trouble urinating or having bowel movements.   ??Watch closely for changes in your health, and be sure to contact your doctor if:  ?? ?? You do not get better as expected.   Where can you learn more?  Go to [email or web address] and sign in to your MyChart account. Enter [phone] in the [place] box to learn more about "Hip Pain: Care Instructions."     If you do not have an account, please click on the "Sign Up Now" link.  Current as of: April 13, 2017  Content Version: 12.0  ?? 2006-2019 Healthwise, Incorporated. Care instructions adapted under license by [facility]. If you have questions about a medical condition or this instruction, always ask your healthcare professional. [place] any warranty or liability for  your use of this information.         Patient Education        Depression Treatment: Care Instructions  Your Care Instructions    Depression is a condition that affects the way you feel, think, and act. It causes symptoms such as low energy, loss of interest in daily activities, and sadness or grouchiness that goes on for a long time. Depression is very common and affects men and women of all ages.  Depression is a medical illness caused by changes in the natural chemicals in your brain. It is not a character flaw, and it does not mean that you are a bad or weak person. It does not mean that you are going crazy.  It is important to know that depression can be treated. Medicines, counseling, and self-care can all help. Many people do not get help because they are embarrassed or think that they will get over the depression on their own. But some people do not get better without treatment.  Follow-up care is a key part of your treatment and safety. Be sure to make and go to all appointments, and call your doctor if you are having problems. It's also a good idea to know your test results and keep a list of the medicines you take.  How can you care for yourself at home?  Learn about antidepressant medicines  Antidepressant medicines can improve or end the symptoms of depression. You may need to take the medicine for at least 6 months, and often longer. Keep taking your medicine even if you feel better. If you stop taking it too soon, your symptoms may come back or get worse.  You may start to feel  better within 1 to 3 weeks of taking antidepressant medicine. But it can take as many as 6 to 8 weeks to see more improvement. Talk to your doctor if you have problems with your medicine or if you do not notice any improvement after 3 weeks.  Antidepressants can make you feel tired, dizzy, or nervous. Some people have dry mouth, constipation, headaches, sexual problems, an upset stomach, or diarrhea. Many of these side effects  are mild and go away on their own after you take the medicine for a few weeks. Some may last longer. Talk to your doctor if side effects bother you too much. You might be able to try a different medicine. If you are pregnant or breastfeeding, talk to your doctor about what medicines you can take.  Learn about counseling  In many cases, counseling can work as well as medicines to treat mild to moderate depression. Counseling is done by licensed mental health providers, such as psychologists, social workers, and some types of nurses. It can be done in one-on-one sessions or in a group setting. Many people find group sessions helpful.  Cognitive-behavioral therapy is a type of counseling. In this treatment therapy, you learn how to see and change unhelpful thinking styles that may be adding to your depression. Counseling and medicines often work well when used together.  To manage depression  ?? Be physically active. Getting 30 minutes of exercise each day is good for your body and your mind. Begin slowly if it is hard for you to get started. If you already exercise, keep it up.  ?? Plan something pleasant for yourself every day. Include activities that you have enjoyed in the past.  ?? Get enough sleep. Talk to your doctor if you have problems sleeping.  ?? Eat a balanced diet. If you do not feel hungry, eat small snacks rather than large meals.  ?? Do not drink alcohol, use illegal drugs, or take medicines that your doctor has not prescribed for you. They may interfere with your treatment.  ?? Spend time with family and friends. It may help to speak openly about your depression with people you trust.  ?? Take your medicines exactly as prescribed. Call your doctor if you think you are having a problem with your medicine.  ?? Do not make major life decisions while you are depressed. Depression may change the way you think. You will be able to make better decisions after you feel better.  ?? Think positively. Challenge negative  thoughts with statements such as "I am hopeful"; "Things will get better"; and "I can ask for the help I need." Write down these statements and read them often, even if you don't believe them yet.  ?? Be patient with yourself. It took time for your depression to develop, and it will take time for your symptoms to improve. Do not take on too much or be too hard on yourself.  ?? Learn all you can about depression from written and online materials.  ?? Check out behavioral health classes to learn more about dealing with depression.  ?? Keep the numbers for these national suicide hotlines: 1-800-273-TALK 806 594 9458) and 1-800-SUICIDE 281-650-2429). If you or someone you know talks about suicide or feeling hopeless, get help right away.  When should you call for help?  Call 911 anytime you think you may need emergency care. For example, call if:  ?? ?? You feel you cannot stop from hurting yourself or someone else.   ??Call your doctor  now or seek immediate medical care if:  ?? ?? You hear voices.   ?? ?? You feel much more depressed.   ??Watch closely for changes in your health, and be sure to contact your doctor if:  ?? ?? You are having problems with your depression medicine.   ?? ?? You are not getting better as expected.   Where can you learn more?  Go to [email or web address] and sign in to your MyChart account. Enter [phone] in the [place] box to learn more about "Depression Treatment: Care Instructions."     If you do not have an account, please click on the "Sign Up Now" link.  Current as of: April 01, 2017  Content Version: 12.0  ?? 2006-2019 Healthwise, Incorporated. Care instructions adapted under license by [facility]. If you have questions about a medical condition or this instruction, always ask your healthcare professional. [place] any warranty or liability for your use of this information.         Patient Education        DASH Diet: Care  Instructions  Your Care Instructions    The DASH diet is an eating plan that can help lower your blood pressure. DASH stands for Dietary Approaches to Stop Hypertension. Hypertension is high blood pressure.  The DASH diet focuses on eating foods that are high in calcium, potassium, and magnesium. These nutrients can lower blood pressure. The foods that are highest in these nutrients are fruits, vegetables, low-fat dairy products, nuts, seeds, and legumes. But taking calcium, potassium, and magnesium supplements instead of eating foods that are high in those nutrients does not have the same effect. The DASH diet also includes whole grains, fish, and poultry.  The DASH diet is one of several lifestyle changes your doctor may recommend to lower your high blood pressure. Your doctor may also want you to decrease the amount of sodium in your diet. Lowering sodium while following the DASH diet can lower blood pressure even further than just the DASH diet alone.  Follow-up care is a key part of your treatment and safety. Be sure to make and go to all appointments, and call your doctor if you are having problems. It's also a good idea to know your test results and keep a list of the medicines you take.  How can you care for yourself at home?  Following the DASH diet  ?? Eat 4 to 5 servings of fruit each day. A serving is 1 medium-sized piece of fruit, ?? cup chopped or canned fruit, 1/4 cup dried fruit, or 4 ounces (?? cup) of fruit juice. Choose fruit more often than fruit juice.  ?? Eat 4 to 5 servings of vegetables each day. A serving is 1 cup of lettuce or raw leafy vegetables, ?? cup of chopped or cooked vegetables, or 4 ounces (?? cup) of vegetable juice. Choose vegetables more often than vegetable juice.  ?? Get 2 to 3 servings of low-fat and fat-free dairy each day. A serving is 8 ounces of milk, 1 cup of yogurt, or 1 ?? ounces of cheese.  ?? Eat 6 to 8 servings of grains each day. A serving is 1 slice of bread, 1 ounce of  dry cereal, or ?? cup of cooked rice, pasta, or cooked cereal. Try to choose whole-grain products as much as possible.  ?? Limit lean meat, poultry, and fish to 2 servings each day. A serving is 3 ounces, about the size of a deck of  cards.  ?? Eat 4 to 5 servings of nuts, seeds, and legumes (cooked dried beans, lentils, and split peas) each week. A serving is 1/3 cup of nuts, 2 tablespoons of seeds, or ?? cup of cooked beans or peas.  ?? Limit fats and oils to 2 to 3 servings each day. A serving is 1 teaspoon of vegetable oil or 2 tablespoons of salad dressing.  ?? Limit sweets and added sugars to 5 servings or less a week. A serving is 1 tablespoon jelly or jam, ?? cup sorbet, or 1 cup of lemonade.  ?? Eat less than 2,300 milligrams (mg) of sodium a day. If you limit your sodium to 1,500 mg a day, you can lower your blood pressure even more.  Tips for success  ?? Start small. Do not try to make dramatic changes to your diet all at once. You might feel that you are missing out on your favorite foods and then be more likely to not follow the plan. Make small changes, and stick with them. Once those changes become habit, add a few more changes.  ?? Try some of the following:  ? Make it a goal to eat a fruit or vegetable at every meal and at snacks. This will make it easy to get the recommended amount of fruits and vegetables each day.  ? Try yogurt topped with fruit and nuts for a snack or healthy dessert.  ? Add lettuce, tomato, cucumber, and onion to sandwiches.  ? Combine a ready-made pizza crust with low-fat mozzarella cheese and lots of vegetable toppings. Try using tomatoes, squash, spinach, broccoli, carrots, cauliflower, and onions.  ? Have a variety of cut-up vegetables with a low-fat dip as an appetizer instead of chips and dip.  ? Sprinkle sunflower seeds or chopped almonds over salads. Or try adding chopped walnuts or almonds to cooked vegetables.  ? Try some vegetarian meals using beans and peas. Add garbanzo or  kidney beans to salads. Make burritos and tacos with mashed pinto beans or black beans.  Where can you learn more?  Go to [email or web address] and sign in to your MyChart account. Enter [phone] in the [place] box to learn more about "DASH Diet: Care Instructions."     If you do not have an account, please click on the "Sign Up Now" link.  Current as of: February 09, 2017  Content Version: 12.0  ?? 2006-2019 Healthwise, Incorporated. Care instructions adapted under license by [facility]. If you have questions about a medical condition or this instruction, always ask your healthcare professional. [place] any warranty or liability for your use of this information.

## 2017-12-05 NOTE — Telephone Encounter (Signed)
Left message for pt to call the office.

## 2017-12-05 NOTE — Telephone Encounter (Signed)
-----   Message from Sheila Oats, MD sent at 12/04/2017  4:43 PM EDT -----  Lumbar shows moderate arthritis and narrowing.  Large spur.  Again ortho.

## 2017-12-05 NOTE — Telephone Encounter (Signed)
-----   Message from Sheila Oats, MD sent at 12/04/2017  4:42 PM EDT -----  Right hip shows severe arthritis and narrowing.  Needs ortho.

## 2017-12-05 NOTE — Telephone Encounter (Signed)
-----   Message from Sheila Oats, MD sent at 12/05/2017  6:41 AM EDT -----  Cholesterol at 179 with normal CRR.  CMP is normal.  Psa is good.  CBC is good.  Labs are good, continue present.

## 2017-12-08 NOTE — Telephone Encounter (Signed)
Mychart message sent to pt.

## 2017-12-16 NOTE — Telephone Encounter (Signed)
Pt notified and states he will finish the bactrim and he found some nystatin and will give that a try.  He mentioned that he didn't know the seeping until after he started the bactrim but he will try it again.  Will make an appt if no improvement.

## 2017-12-16 NOTE — Telephone Encounter (Signed)
Treating with antifungals.  How long ago was the bactrim?  May need to retry this.

## 2017-12-16 NOTE — Telephone Encounter (Signed)
Pt left a voicemail stating Dan seen Dr Ilsa Iha for an abd wound that came back positive for staph.  They both took Bactrim DS 800-160 tablets.  He has developed a small seeping wound on his penis.  He started taking diflucan and Lamisil and cant get rid of it.  There has been no sexual contact to the area so knows it's not a std.  He would like to know RAR's thoughts and recommendations.  If pt needs an appt, he will make an appt.  Call back [phone] or [phone].

## 2017-12-16 NOTE — Telephone Encounter (Signed)
Would finish the bactrim or can do an appt and culture the wound.

## 2017-12-16 NOTE — Telephone Encounter (Signed)
Spoke with Linna Hoff and states Kier thought the bactrim was what caused the yeast/seeping wound so he stopped it.  He was on it for 4 days.  He was given a total of 10 days of the bactrim.

## 2017-12-17 NOTE — Telephone Encounter (Signed)
Attempted to call pt, no answer or VM set up, will try again later.

## 2017-12-17 NOTE — Telephone Encounter (Signed)
Patient states that he is still having the low back and right hip pain.  He would like to go ahead with the MRI so that he can get epidurals.  Call him back at [phone]

## 2017-12-17 NOTE — Telephone Encounter (Signed)
As told before, would go to ortho for this.  MRI is likely to be denied through his insurance unless sees the specialist.

## 2017-12-18 ENCOUNTER — Ambulatory Visit: Admit: 2017-12-18 | Discharge: 2017-12-18 | Payer: MEDICARE | Attending: Family Medicine

## 2017-12-18 DIAGNOSIS — M5136 Other intervertebral disc degeneration, lumbar region: Secondary | ICD-10-CM

## 2017-12-18 MED ORDER — FLUCONAZOLE 100 MG PO TABS
100 MG | ORAL_TABLET | Freq: Every day | ORAL | 0 refills | Status: AC
Start: 2017-12-18 — End: 2018-01-01

## 2017-12-18 NOTE — Progress Notes (Signed)
Subjective:      Patient ID: Bradley Oconnell is a 62 y.o. male.    HPI  Pt here for  acheck up.  Reviewed BMI of 32.  Encouraged diet, exercise and weight loss.  Reviewed health maintenance.  1 week of penile lesion after starting bactrim.  Has tried multiple topicals, pills, with so-so success.  Is interested in epidural for his back.  Plain films are abnormal.  NOT intereted in surgery if possible.   Partnered, former smoker, pmh reviewed.     Review of Systems   Constitutional: Negative for chills, fatigue, fever and unexpected weight change.   HENT: Negative for congestion, ear pain, rhinorrhea and sore throat.    Eyes: Negative for pain and visual disturbance.   Respiratory: Negative for cough, chest tightness, shortness of breath and wheezing.    Cardiovascular: Negative for chest pain and palpitations.   Gastrointestinal: Negative for abdominal pain, blood in stool, constipation, diarrhea, nausea and vomiting.   Genitourinary: Negative for difficulty urinating, frequency, hematuria and urgency.   Musculoskeletal: Positive for back pain. Negative for joint swelling, myalgias and neck pain.   Skin: Positive for rash.   Neurological: Negative for dizziness and headaches.   Hematological: Negative for adenopathy. Does not bruise/bleed easily.   Psychiatric/Behavioral: Negative for behavioral problems and sleep disturbance. The patient is not nervous/anxious.        Objective:   Physical Exam   Constitutional: He is oriented to person, place, and time. He appears well-developed and well-nourished.   HENT:   Head: Normocephalic and atraumatic.   Right Ear: External ear normal.   Left Ear: External ear normal.   Nose: Nose normal.   Mouth/Throat: Oropharynx is clear and moist.   Eyes: Pupils are equal, round, and reactive to light. EOM are normal.   Neck: Neck supple. No thyromegaly present.   Cardiovascular: Normal rate, regular rhythm and normal heart sounds.   Pulmonary/Chest: Breath sounds normal. He has no  wheezes. He has no rales.   Abdominal: Soft. Bowel sounds are normal. There is no tenderness. There is no rebound and no guarding.   Genitourinary:   Genitourinary Comments: Under surface of penis at the glans shows small lesion, like skin layer is gone.  Slight wet.  Probable yeast.     Musculoskeletal: Normal range of motion. He exhibits no edema.        Lumbar back: He exhibits pain.        Back:    Lymphadenopathy:     He has no cervical adenopathy.   Neurological: He is alert and oriented to person, place, and time. He has normal reflexes. No cranial nerve deficit.   Skin: Skin is warm and dry. No rash noted.   Psychiatric: He has a normal mood and affect.   Nursing note and vitals reviewed.      Assessment:      Low back pain  Penile candidiasis      Plan:      [facility] ( [facility]) consult for epidural  Diflucan 100 mg daily x 7-10 days.            Rosamaria Lints, MD

## 2017-12-18 NOTE — Patient Instructions (Signed)
Patient Education        Learning About Degenerative Disc Disease  What is degenerative disc disease?    Degenerative disc disease is not really a disease. It's a term used to describe the normal changes in your spinal discs as you age. Spinal discs are small, spongy discs that separate the bones (vertebrae) that make up the spine. The discs act as shock absorbers for the spine, so it can flex, bend, and twist.  Degenerative disc disease can take place in one or more places along the spine. It most often occurs in the discs in the lower back and the neck.  What causes it?  As we age, our spinal discs break down, or degenerate. This breakdown causes the symptoms of degenerative disc disease in some people.  When the discs break down, they can lose fluid and dry out, and their outer layers can have tiny cracks or tears. This leads to less padding and less space between the bones in the spine. The body reacts to this by making bony growths on the spine called bone spurs. These spurs can press on the spinal nerve roots or spinal cord. This can cause pain and can affect how well the nerves work.  What are the symptoms?  Many people with degenerative disc disease have no pain. But others have severe pain or other symptoms that limit their activities. Some of the most common symptoms are:  ?? Pain in the back or neck. Where the pain occurs depends on which discs are affected.  ?? Pain that gets worse when you move, such as bending over, reaching up, or twisting.  ?? Pain that may occur in the rear end (buttocks), arm, or leg if a nerve is pinched.  ?? Numbness or tingling in your arm or leg.  How is it diagnosed?  A doctor can often diagnose degenerative disc disease while doing a physical exam. If your exam shows no signs of a serious condition, imaging tests (such as an X-ray) aren't likely to help your doctor find the cause of your symptoms.  Sometimes degenerative disc disease is found when an X-ray is taken for another  reason, such as an injury or other health problem. But even if the doctor finds degenerative disc disease, that doesn't always mean that you will have symptoms.  How is it treated?  There are several things you can do at home to manage pain from this problem.  ?? To relieve pain, use ice or heat (whichever feels better) on the affected area.  ? Put ice or a cold pack on the area for 10 to 20 minutes at a time. Put a thin cloth between the ice and your skin.  ? Put a warm water bottle, a heating pad set on low, or a warm cloth on your back. Put a thin cloth between the heating pad and your skin. Do not go to sleep with a heating pad on your skin.  ?? Ask your doctor if you can take acetaminophen (such as Tylenol) or nonsteroidal anti-inflammatory drugs, such as ibuprofen or naproxen. Your doctor can prescribe stronger medicines if needed. Be safe with medicines. Read and follow all instructions on the label.  ?? Get some exercise every day. Exercise is one of the best ways to help your back feel better and stay better. It's best to start each exercise slowly. You may notice a little soreness, and that's okay. But if an exercise makes your pain worse, stop doing it. Here   are things you can try:  ? Walking. It's the simplest and maybe the best activity for your back. It gets your blood moving and helps your muscles stay strong.  ? Exercises that gently stretch and strengthen your stomach, back, and leg muscles. The stronger those muscles are, the better they're able to protect your back.  If you have constant or severe pain in your back or spine, you may need other treatments, such as physical therapy. In some cases, your doctor may suggest surgery.  Follow-up care is a key part of your treatment and safety. Be sure to make and go to all appointments, and call your doctor if you are having problems. It's also a good idea to know your test results and keep a list of the medicines you take.  Where can you learn more?  Go to  [email or web address] and sign in to your MyChart account. Enter [phone] in the [place] box to learn more about "Learning About Degenerative Disc Disease."     If you do not have an account, please click on the "Sign Up Now" link.  Current as of: April 10, 2017  Content Version: 12.0  ?? 2006-2019 Healthwise, Inc[employer]. Care instructions adapted under license by [facility]. If you have questions about a medical condition or this instruction, always ask your healthcare professional. [place] any warranty or liability for your use of this information.         Patient Education        Candidiasis: Care Instructions  Your Care Instructions  Candidiasis (say "kan-dih-DY-uh-sus") is a yeast infection. Yeast normally lives in your body. But it can cause problems if your body's defenses don't work as they should.  Some medicines can increase your chance of getting a yeast infection. These include antibiotics, steroids, and cancer drugs. And some diseases like AIDS and diabetes can make you more likely to get yeast infections.  There are different types of yeast infections.  Bradley Oconnell is a yeast infection in the mouth. It usually occurs in people with weak immune systems. It causes white patches inside the mouth and throat.  Yeast infections of the skin usually occur in skin folds where the skin stays moist. They cause red, oozing patches on your skin. Babies can get these infections under the diaper. People who often wear gloves can get them on their hands.  Many women get vaginal yeast infections. They are most common when women take antibiotics. These infections can cause the vagina to itch and burn. They also cause white discharge that looks like cottage cheese.  In rare cases, yeast infects the blood. This can cause serious disease. This kind of infection is treated with medicine given through a needle into a vein (IV).  After you start treatment, a yeast  infection usually goes away quickly. But if your immune system is weak, the infection may come back. Tell your doctor if you get yeast infections often.  Follow-up care is a key part of your treatment and safety. Be sure to make and go to all appointments, and call your doctor if you are having problems. It's also a good idea to know your test results and keep a list of the medicines you take.  How can you care for yourself at home?  ?? Take your medicines exactly as prescribed. Call your doctor if you think you are having a problem with your medicine.  ?? Use antibiotics only as directed by your doctor.  ?? Eat yogurt  with live cultures. It has bacteria called lactobacillus. It may help prevent some types of yeast infections.  ?? Keep your skin clean and dry. Put powder on moist places.  ?? If you are using a cream or suppository to treat a vaginal yeast infection, don't use condoms or a diaphragm. Use a different type of birth control.  ?? Eat a healthy diet and get regular exercise. This will help keep your immune system strong.  When should you call for help?  Watch closely for changes in your health, and be sure to contact your doctor if:  ?? ?? You do not get better as expected.   Where can you learn more?  Go to [email or web address] and sign in to your MyChart account. Enter [id number] in the [place] box to learn more about "Candidiasis: Care Instructions."     If you do not have an account, please click on the "Sign Up Now" link.  Current as of: Dec 02, 2016  Content Version: 12.0  ?? 2006-2019 Healthwise, Incorporated. Care instructions adapted under license by [facility]. If you have questions about a medical condition or this instruction, always ask your healthcare professional. [place] any warranty or liability for your use of this information.

## 2017-12-18 NOTE — Progress Notes (Signed)
Spoke with OIO and states the pt will need a referral to one of the back doctor's and they will schedule the epidural (if a candidate).  The epidural will be done on the surgery side or @ the hospital.    Left message for pt to call the office.

## 2017-12-18 NOTE — Telephone Encounter (Signed)
Pt notified. He will discuss with RAR at today's appointment.

## 2017-12-24 ENCOUNTER — Telehealth

## 2017-12-24 NOTE — Telephone Encounter (Signed)
Not sure where we are with this, I thought Chas was calling OIO to see if Schweiler  Would see him for epidurals without an MRI.  Can try to order MRI, may be denied, and have to see specialist as I suggested in the first place.

## 2017-12-24 NOTE — Telephone Encounter (Signed)
Patient called asking if we had approval from his insurance for his MRI?  Did you order one for him?  He says he doesn't care if he has it done at the hospital or at [facility].  Please call him back at [phone]

## 2017-12-25 NOTE — Telephone Encounter (Signed)
Referral to Dr Remer Macho scheduled for 12/29/2017 @ 1:20pm.  Arrive @ 1pm.    Pt notified of appt time and date.

## 2017-12-25 NOTE — Telephone Encounter (Signed)
Spoke with Shanon Brow and he wished to proceed with consult to Dr Jerl Mina at [facility] for this back issues. He understands that he must see specialist prior to epidurals and or MRI.

## 2017-12-31 NOTE — Telephone Encounter (Signed)
Patient called in and reported that after taking his hearing aids out of the charger and wearing them for 3 hours he gets a low battery signal. Jonna Munro and they recommended sending in both hearing aids and the charger for repair. Patient stated that he will drop hearing aids and charger off when he gets back into town.

## 2018-01-06 NOTE — Progress Notes (Signed)
Bradley Oconnell. [name], Bradley Oconnell [address]. [address]  [place] [address]  [phone]  New Patient Evaluation in Office    Pt Name: Bradley Oconnell  Date of Birth [birth year]   Today's Date: 01/06/2018  Medical Record Number: [id number]  Referring Provider: No ref. provider found  Primary Care Provider: Sheila Oats, MD  Chief Complaint   Patient presents with   ??? Surgical Consult     est patient last seen 09/10/16- hemorrhoid     ASSESSMENT       Diagnosis Orders   1. Anal fissure       Past Medical History:   Diagnosis Date   ??? Cervical pain (neck)    ??? Depression, recurrent ([place])    ??? ED (erectile dysfunction)    ??? Genital warts    ??? OA (osteoarthritis)    ??? Testosterone deficiency           PLANS      1. Rx Diltiazem gel  2. Avoid constipation  3. F/u 2 weeks     SUBJECTIVE      Bradley Oconnell is a 62 y.o. male seen in the consultation for evaluation regarding rectal pain and bleeding. He describes symptoms as bleeding which only occurs with bowel movements and painful defecation.Onset of symptoms was abrupt 2 months ago and has gradually worsened since that time. He denies family hx of colorectal CA, melena and crohn's disease. Treatment to date has been none.  Pain is controlled without any medications. Pt has had a previous colonoscopy.  Past Medical History  Past Medical History:   Diagnosis Date   ??? Cervical pain (neck)    ??? Depression, recurrent ([place])    ??? ED (erectile dysfunction)    ??? Genital warts    ??? OA (osteoarthritis)    ??? Testosterone deficiency      Past Surgical History  Past Surgical History:   Procedure Laterality Date   ??? COLONOSCOPY      polyps   ??? FOOT SURGERY Bilateral     x 4   ??? FOOT SURGERY Left 07/19/2014   ??? HEMORRHOID SURGERY  2006   ??? HERNIA REPAIR  2010   ??? JOINT REPLACEMENT Left 10/28/2016    knee   ??? ROTATOR CUFF REPAIR Right    ??? UPPER GASTROINTESTINAL ENDOSCOPY  04/15/13    DR. Ilsa Iha NO BX.     Medications  Current Outpatient Medications    Medication Sig Dispense Refill   ??? QUEtiapine (SEROQUEL) 25 MG tablet TAKE 1 TABLET BY MOUTH ONE TIME A DAY AS NEEDED FOR SLEEP (Patient taking differently: Take 12.5 mg by mouth nightly TAKE 1 TABLET BY MOUTH ONE TIME A DAY AS NEEDED FOR SLEEP) 30 tablet 2   ??? Handicap Placard MISC by Does not apply route Request parking placard due to medical conditions.  Duration of 5 years. 2 each 0   ??? topiramate (TOPAMAX) 100 MG tablet Take 1 tablet by mouth 3 times daily 270 tablet 3   ??? tranylcypromine (PARNATE) 10 MG tablet Take 1 tablet by mouth 3 times daily 270 tablet 3   ??? Biotin 1000 MCG TABS Take by mouth     ??? finasteride (PROSCAR) 5 MG tablet Take 1 tablet by mouth daily 90 tablet 1   ??? ketorolac (TORADOL) 10 MG tablet Take 1 tablet by mouth every 6 hours as needed for Pain 20 tablet 0   ???  etodolac (LODINE XL) 600 MG extended release tablet Take 1 tablet by mouth 2 times daily 180 tablet 3     No current facility-administered medications for this visit.      Allergies  is allergic to gabapentin.  Family History  family history includes Arthritis in his father and mother; Cancer in his father; Depression in his father and son; Early Death in his son; Heart Disease in his father; Parkinsonism in his mother; Stroke in his father.  Social History   reports that he quit smoking about 6 years ago. His smoking use included cigarettes. He has a 5.00 pack-year smoking history. He has never used smokeless tobacco. He reports that he drinks alcohol. He reports that he does not use drugs.  Health Screening Exams  Health Maintenance   Topic Date Due   ??? DTaP/Tdap/Td vaccine (1 - Tdap) 07/05/1973   ??? Shingles Vaccine (2 of 3) 10/29/2011   ??? Lipid screen  12/05/2022   ??? Colon cancer screen colonoscopy  04/16/2023   ??? Flu vaccine  Completed   ??? Hepatitis C screen  Completed   ??? HIV screen  Completed   ??? Pneumococcal 0-64 years Vaccine  Aged Out     Review of Systems  Constitutional: Negative for activity change, appetite change,  chills, diaphoresis, fatigue, fever and unexpected weight change.   HENT: Negative for congestion, dental problem, drooling, ear discharge, ear pain, facial swelling, hearing loss, mouth sores, nosebleeds, postnasal drip, rhinorrhea, sinus pressure, sneezing, sore throat, tinnitus, trouble swallowing and voice change.   Eyes: Negative for photophobia, pain, discharge, redness, itching and visual disturbance.   Respiratory: Negative for apnea, cough, choking, chest tightness, shortness of breath, wheezing and stridor.   Cardiovascular: Negative for chest pain, palpitations and leg swelling.   Gastrointestinal: Positive for anal bleeding and rectal pain. Negative for abdominal distention, abdominal pain, blood in stool, constipation, diarrhea, nausea and vomiting.   Genitourinary: Positive for frequency. Negative for decreased urine volume, difficulty urinating, discharge, dysuria, enuresis, flank pain, genital sores, hematuria, penile pain, penile swelling, scrotal swelling, testicular pain and urgency.   Musculoskeletal: Negative for arthralgias, back pain, gait problem, joint swelling, myalgias, neck pain and neck stiffness.   Skin: Negative for color change, pallor, rash and wound.   Neurological: Negative for dizziness, tremors, seizures, syncope, facial asymmetry, speech difficulty, weakness, light-headedness, numbness and headaches.   Hematological: Negative for adenopathy. Bruises/bleeds easily.   Psychiatric/Behavioral: Negative for agitation, behavioral problems, confusion, decreased concentration, dysphoric mood, hallucinations, self-injury, sleep disturbance and suicidal ideas. The patient is not nervous/anxious and is not hyperactive.       OBJECTIVE    VITALS:  height is 5\' 7"  (1.702 m) and weight is 204 lb (92.5 kg). His tympanic temperature is 98 ??F (36.7 ??C). His blood pressure is 108/70 and his pulse is 89. His respiration is 18 and oxygen saturation is 98%.  Pain Score:   0 - No pain Body mass index  is 31.95 kg/m??.  CONSTITUTIONAL: Alert and oriented times 3, no acute distress and cooperative to examination with proper mood and affect.  SKIN: Skin color, texture, turgor normal. No rashes or lesions.  LYMPH: no cervical nodes, no inguinal nodes  HEENT: Head is normocephalic, atraumatic. EOMI, PERRLA.  NECK: Supple, symmetrical, trachea midline, no adenopathy, thyroid symmetric, not enlarged and no tenderness, skin normal.   CHEST/LUNGS: chest symmetric with normal A/P diameter, normal respiratory rate and rhythm, lungs clear to auscultation without wheezes, rales or rhonchi. No accessory muscle  use. Scars None   CARDIOVASCULAR: Heart sounds are normal.  Regular rate and rhythm without murmur, gallop or rub. Normal S1 and S2. Carotid and femoral pulses 2+/4 and equal bilaterally.  ABDOMEN: Normal shape. surgical scar(s) present. Normal bowel sounds.  No bruits. soft, nontender, nondistended, no masses or organomegaly. no evidence of hernia. Percussion: Normal without hepatosplenomegally.   RECTAL: Old hemorrhoidectomy scars. + anal fissure  NEUROLOGIC: There are no focalizing motor or sensory deficits. CN II-XII are grossly intact.Marland Kitchen   EXTREMITIES: no cyanosis, no clubbing and no edema.      Thank you for the interesting evaluation. Further recommendations as listed above.       Electronically signed by Wendee Copp, DO on 01/06/2018 at 12:24 PM

## 2018-01-06 NOTE — Progress Notes (Deleted)
Subjective:      Patient ID: Bradley Oconnell is a 62 y.o. male.  Chief Complaint   Patient presents with   . Surgical Consult     est patient last seen 09/10/16- hemorrhoid       HPI    Review of Systems   Constitutional: Negative for activity change, appetite change, chills, diaphoresis, fatigue, fever and unexpected weight change.   HENT: Negative for congestion, dental problem, drooling, ear discharge, ear pain, facial swelling, hearing loss, mouth sores, nosebleeds, postnasal drip, rhinorrhea, sinus pressure, sneezing, sore throat, tinnitus, trouble swallowing and voice change.    Eyes: Negative for photophobia, pain, discharge, redness, itching and visual disturbance.   Respiratory: Negative for apnea, cough, choking, chest tightness, shortness of breath, wheezing and stridor.    Cardiovascular: Negative for chest pain, palpitations and leg swelling.   Gastrointestinal: Positive for anal bleeding and rectal pain. Negative for abdominal distention, abdominal pain, blood in stool, constipation, diarrhea, nausea and vomiting.   Genitourinary: Positive for frequency. Negative for decreased urine volume, difficulty urinating, discharge, dysuria, enuresis, flank pain, genital sores, hematuria, penile pain, penile swelling, scrotal swelling, testicular pain and urgency.   Musculoskeletal: Negative for arthralgias, back pain, gait problem, joint swelling, myalgias, neck pain and neck stiffness.   Skin: Negative for color change, pallor, rash and wound.   Neurological: Negative for dizziness, tremors, seizures, syncope, facial asymmetry, speech difficulty, weakness, light-headedness, numbness and headaches.   Hematological: Negative for adenopathy. Bruises/bleeds easily.   Psychiatric/Behavioral: Negative for agitation, behavioral problems, confusion, decreased concentration, dysphoric mood, hallucinations, self-injury, sleep disturbance and suicidal ideas. The patient is not nervous/anxious and is not hyperactive.       BP 108/70 (Site: Right Upper Arm, Position: Sitting, Cuff Size: Medium Adult)   Pulse 89   Temp 98 F (36.7 C) (Tympanic)   Resp 18   Ht 5\' 7"  (1.702 m)   Wt 204 lb (92.5 kg)   SpO2 98%   BMI 31.95 kg/m     Objective:   Physical Exam    Assessment:            Plan:              Ihor Gully, RN

## 2018-01-08 ENCOUNTER — Encounter

## 2018-01-08 ENCOUNTER — Inpatient Hospital Stay: Admit: 2018-01-08 | Payer: MEDICARE

## 2018-01-08 ENCOUNTER — Inpatient Hospital Stay: Payer: MEDICARE

## 2018-01-08 DIAGNOSIS — C4401 Basal cell carcinoma of skin of lip: Secondary | ICD-10-CM

## 2018-01-08 LAB — BASIC METABOLIC PANEL
BUN: 17 mg/dL (ref 7–22)
CO2: 24 meq/L (ref 23–33)
Calcium: 9.5 mg/dL (ref 8.5–10.5)
Chloride: 103 meq/L (ref 98–111)
Creatinine: 0.9 mg/dL (ref 0.4–1.2)
Glucose: 81 mg/dL (ref 70–108)
Potassium: 4.8 meq/L (ref 3.5–5.2)
Sodium: 141 meq/L (ref 135–145)

## 2018-01-08 LAB — EKG 12-LEAD
Atrial Rate: 58 {beats}/min
P Axis: 55 degrees
P-R Interval: 172 ms
Q-T Interval: 448 ms
QRS Duration: 104 ms
QTc Calculation (Bazett): 439 ms
R Axis: 29 degrees
T Axis: 28 degrees
Ventricular Rate: 58 {beats}/min

## 2018-01-08 LAB — ANION GAP: Anion Gap: 14 meq/L (ref 8.0–16.0)

## 2018-01-08 LAB — GLOMERULAR FILTRATION RATE, ESTIMATED: Est, Glom Filt Rate: 85 mL/min/{1.73_m2} — AB

## 2018-01-09 ENCOUNTER — Inpatient Hospital Stay: Payer: MEDICARE

## 2018-01-09 MED ORDER — CEFAZOLIN SODIUM 1 G IJ SOLR
1 g | INTRAMUSCULAR | Status: DC | PRN
Start: 2018-01-09 — End: 2018-01-09
  Administered 2018-01-09: 19:00:00 2000 via INTRAVENOUS

## 2018-01-09 MED ORDER — PROPOFOL 200 MG/20ML IV EMUL
200 MG/20ML | INTRAVENOUS | Status: DC | PRN
Start: 2018-01-09 — End: 2018-01-09
  Administered 2018-01-09: 19:00:00 100 via INTRAVENOUS

## 2018-01-09 MED ORDER — FENTANYL CITRATE (PF) 100 MCG/2ML IJ SOLN
100 | INTRAMUSCULAR | Status: AC
Start: 2018-01-09 — End: 2018-01-09

## 2018-01-09 MED ORDER — FENTANYL CITRATE (PF) 100 MCG/2ML IJ SOLN
100 MCG/2ML | INTRAMUSCULAR | Status: DC | PRN
Start: 2018-01-09 — End: 2018-01-09
  Administered 2018-01-09 (×2): 50 via INTRAVENOUS

## 2018-01-09 MED ORDER — LIDOCAINE-EPINEPHRINE 1 %-1:100000 IJ SOLN
1 %-:00000 | INTRAMUSCULAR | Status: DC | PRN
Start: 2018-01-09 — End: 2018-01-09
  Administered 2018-01-09: 19:00:00 23 via INTRADERMAL

## 2018-01-09 MED ORDER — PROPOFOL 200 MG/20ML IV EMUL
200 | INTRAVENOUS | Status: AC
Start: 2018-01-09 — End: 2018-01-09

## 2018-01-09 MED ORDER — MIDAZOLAM HCL 2 MG/2ML IJ SOLN
2 | INTRAMUSCULAR | Status: AC
Start: 2018-01-09 — End: 2018-01-09

## 2018-01-09 MED ORDER — SODIUM CHLORIDE 0.9 % IV SOLN
0.9 % | INTRAVENOUS | Status: DC
Start: 2018-01-09 — End: 2018-01-09
  Administered 2018-01-09: 19:00:00 via INTRAVENOUS

## 2018-01-09 MED ORDER — MIDAZOLAM HCL 2 MG/2ML IJ SOLN
2 MG/ML | INTRAMUSCULAR | Status: DC | PRN
Start: 2018-01-09 — End: 2018-01-09
  Administered 2018-01-09: 19:00:00 2 via INTRAVENOUS

## 2018-01-09 MED ORDER — CEFAZOLIN 2000 MG D5W 50 ML IVPB
Status: DC
Start: 2018-01-09 — End: 2018-01-09

## 2018-01-09 MED FILL — FENTANYL CITRATE (PF) 100 MCG/2ML IJ SOLN: 100 MCG/2ML | INTRAMUSCULAR | Qty: 2

## 2018-01-09 MED FILL — MIDAZOLAM HCL 2 MG/2ML IJ SOLN: 2 mg/mL | INTRAMUSCULAR | Qty: 2

## 2018-01-09 MED FILL — PROPOFOL 200 MG/20ML IV EMUL: 200 MG/20ML | INTRAVENOUS | Qty: 20

## 2018-01-09 NOTE — H&P (Signed)
[facility]  History and Physical Update    Pt Name: Bradley Oconnell  MRN: [id number]  Birthdate: Jul 04, 1956  Date of evaluation: 01/09/2018    I have examined the patient and reviewed the H&P/Consult. Due to the large nature of the Mohs defect, it was determined that it would be in the best interest of the patient to perform the repair in the OR under MAC anesthesia.      [name]  Electronically signed 01/09/2018 at 7:03 AM

## 2018-01-09 NOTE — Op Note (Signed)
Operative Note    Patient name: Bradley Oconnell             Medical Record Number: [id number]    Primary Care Physician: Sheila Oats, MD    DOB Aug 28, 1955    Date of Procedure: 01/09/2018    Pre-operative Diagnosis: 3cm2 complex defect of right upper lip s/p MOHS for basal cell carcinoma    Post-operative Diagnosis: Same    Procedure Performed: Cheiloplasty (over 1/2 vertical height, complex) repair of right upper lip defect (CPT [phone])    Surgeons/Assistants: Dr. Annice Needy Jeanette Caprice Lenna Sciara Nash Mantis PA-C/Lance Evelene Croon DPM    Estimated Blood Loss: 32ml     Complications: none immediately appreciated    Procedure:    With the patient lying in the supine position and under adequate anesthesia per the anesthesia team, the area was anesthetized with a total of 23 ml of 1% Lidocaine 1:100,000 with epinephrine solution.  The area was then prepped and draped in the standard surgical fashion.  There was a very sizeable defect, which could not be closed primarily.  Therefore, a cheiloplasty was was then designed, elevated and inset with 4-0 Monocryl suture placed in interrupted buried fashion accurately realigning the white roll of the lip and ligating the branchs of the labial arteries. The Burrow's triangles were resected to prevent dog-ear deformity and final closure was completed using Histoacryl on outer lip, 4-0 chromic on inner mucosal surface.  The patient tolerated the procedure quite well and remained hemodynamically stable throughout the procedure and was quite comfortable throughout the operative course.    Clinical staging for cancer cases:  Ct  Cn  Cm    [name]  Electronically signed by me on 01/09/2018 at 3:58 PM

## 2018-01-09 NOTE — Progress Notes (Signed)
MRSA swab obtained from rectum for clearance.

## 2018-01-09 NOTE — Anesthesia Pre-Procedure Evaluation (Signed)
Department of Anesthesiology  Preprocedure Note       Name:  Bradley Oconnell   Age:  62 y.o.  DOB:  1956-04-13                                          MRN:  [id number]         Date:  01/09/2018      Surgeon: Juliann Mule):  [name], MD    Procedure: MOHS REPAIR BCC RIGHT UPPER LIP (Right Face)    Medications prior to admission:   Prior to Admission medications    Medication Sig Start Date End Date Taking? Authorizing Provider   QUEtiapine (SEROQUEL) 25 MG tablet TAKE 1 TABLET BY MOUTH ONE TIME A DAY AS NEEDED FOR SLEEP  Patient taking differently: Take 12.5 mg by mouth nightly TAKE 1 TABLET BY MOUTH ONE TIME A DAY AS NEEDED FOR SLEEP 12/04/17  Yes Sheila Oats, MD   ketorolac (TORADOL) 10 MG tablet Take 1 tablet by mouth every 6 hours as needed for Pain 12/04/17  Yes Sheila Oats, MD   etodolac (LODINE XL) 600 MG extended release tablet Take 1 tablet by mouth 2 times daily 12/04/17  Yes Sheila Oats, MD   topiramate (TOPAMAX) 100 MG tablet Take 1 tablet by mouth 3 times daily 09/02/16  Yes Sheila Oats, MD   tranylcypromine (PARNATE) 10 MG tablet Take 1 tablet by mouth 3 times daily 09/02/16  Yes Sheila Oats, MD   Biotin 1000 MCG TABS Take by mouth   Yes Historical Provider, MD   finasteride (PROSCAR) 5 MG tablet Take 1 tablet by mouth daily 10/10/15 01/09/18 Yes Sheila Oats, MD   Handicap Placard MISC by Does not apply route Request parking placard due to medical conditions.  Duration of 5 years. 12/04/17   Sheila Oats, MD   oxybutynin (DITROPAN) 5 MG tablet Take 1 tablet by mouth 2 times daily. 05/11/12 07/12/14  Sheila Oats, MD       Current medications:    Current Facility-Administered Medications   Medication Dose Route Frequency Provider Last Rate Last Dose   ??? ceFAZolin (ANCEF) 2 g in dextrose 5 % 50 mL IVPB  2 g Intravenous On Call to OR [name], MD       ??? 0.9 % sodium chloride infusion   Intravenous Continuous [name], MD           Allergies:     Allergies   Allergen Reactions   ??? Gabapentin Swelling       Problem List:    Patient Active Problem List   Diagnosis Code   ??? ED (erectile dysfunction) N52.9   ??? Testosterone deficiency E34.9   ??? OA (osteoarthritis) M19.90   ??? Overactive bladder N32.81   ??? Chronic depression F32.9   ??? Obesity (BMI 30.0-34.9) E66.9   ??? Posterior tibial tendon dysfunction (PTTD) of both lower extremities M76.821, Y40.347   ??? Primary osteoarthritis of left knee M17.12   ??? 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 Cervical pain (neck)    ??? Depression, recurrent ([place])    ??? ED (erectile dysfunction)    ??? Genital warts    ??? OA (osteoarthritis)    ???  Testosterone deficiency        Past Surgical History:        Procedure Laterality Date   ??? COLONOSCOPY      polyps   ??? FOOT SURGERY Bilateral     x 4   ??? FOOT SURGERY Left 07/19/2014   ??? HEMORRHOID SURGERY  2006   ??? HERNIA REPAIR  2010   ??? JOINT REPLACEMENT Left 10/28/2016    knee   ??? ROTATOR CUFF REPAIR Right    ??? UPPER GASTROINTESTINAL ENDOSCOPY  04/15/13    DR. Ilsa Iha NO [facility].       Social History:    Social History     Tobacco Use   ??? Smoking status: Former Smoker     Packs/day: 0.25     Years: 20.00     Pack years: 5.00     Types: Cigarettes     Last attempt to quit: 06/23/2011     Years since quitting: 6.5   ??? Smokeless tobacco: Never Used   Substance Use Topics   ??? Alcohol use: Yes     Comment: social                                Counseling given: Not Answered      Vital Signs (Current):   Vitals:    01/09/18 1350   BP: 138/67   Pulse: 68   Resp: 16   Temp: 97.5 ??F (36.4 ??C)   TempSrc: Temporal   SpO2: 97%   Weight: 203 lb (92.1 kg)   Height: _0  (1.702 m)                                              BP Readings from Last 3 Encounters:   01/09/18 138/67   01/06/18 108/70   12/18/17 114/84       NPO Status: Time of last liquid consumption: 0730(sip)                        Time of last solid consumption: 2000                         Date of last liquid consumption: 01/09/18                        Date of last solid food consumption: 01/08/18    BMI:   Wt Readings from Last 3 Encounters:   01/09/18 203 lb (92.1 kg)   01/06/18 204 lb (92.5 kg)   12/18/17 209 lb (94.8 kg)     Body mass index is 31.79 kg/m??.    CBC:   Lab Results   Component Value Date    WBC 5.4 12/04/2017    RBC 4.49 12/04/2017    RBC 4.39 02/26/2010    HGB 14.4 12/04/2017    HCT 43.7 12/04/2017    MCV 97.3 12/04/2017    RDW 13.8 08/26/2016    PLT 210 12/04/2017       CMP:   Lab Results   Component Value Date    NA 141 01/08/2018    K 4.8 01/08/2018    CL 103 01/08/2018    CO2 24 01/08/2018    BUN 17 01/08/2018  CREATININE 0.9 01/08/2018    LABGLOM 85 01/08/2018    GLUCOSE 81 01/08/2018    GLUCOSE 94 02/26/2010    PROT 6.9 12/04/2017    CALCIUM 9.5 01/08/2018    BILITOT 0.3 12/04/2017    ALKPHOS 64 12/04/2017    AST 20 12/04/2017    ALT 13 12/04/2017       POC Tests: No results for input(s): POCGLU, POCNA, POCK, POCCL, POCBUN, POCHEMO, POCHCT in the last 72 hours.    Coags:   Lab Results   Component Value Date    INR 0.94 08/26/2016    APTT 30.5 08/26/2016       HCG (If Applicable): No results found for: PREGTESTUR, PREGSERUM, HCG, HCGQUANT     ABGs: No results found for: PHART, PO2ART, PCO2ART, HCO3ART, BEART, O2SATART     Type & Screen (If Applicable):  Lab Results   Component Value Date    LABRH NEG 08/26/2016       Anesthesia Evaluation    Airway: Mallampati: II       Mouth opening: > = 3 FB Dental:          Pulmonary:       (-) COPD                           Cardiovascular:        (-) CAD      Rhythm: regular                      Neuro/Psych:   (+) neuromuscular disease:, psychiatric history:            GI/Hepatic/Renal:             Endo/Other:                     Abdominal:           Vascular:                                        Anesthesia Plan      MAC     ASA 2             Anesthetic plan and risks discussed with patient.      Plan discussed with  CRNA.                  Gala Romney, MD   01/09/2018

## 2018-01-09 NOTE — Progress Notes (Signed)
1601 To recovery via chair. Spont resp. VSS. IV infusing KVO. Incision to right upper lip clean dry and intact. Skin warm and dry. Oriented times three. Denies pain or nausea. Spouse to room. Call bell in reach. Snack and drink given  1605 Dr Jeanette Caprice in to see pt. Ice pack applied to right upper lip  1625 Up and dressed. Denies nausea pr pain. IV discontinued.  1630 Discharge instructions given to pt and spouse with each voicing understanding. Ice pack given to trip home  1635 Discharge to home in stable ambulatory condition with spouse

## 2018-01-09 NOTE — Discharge Instructions (Addendum)
POST OPERATIVE INSTRUCTION SHEET  SKIN TUMOR/LESION REMOVAL          Activity:     No strenuous activity for 48 hours   No activity that stresses the suture closure/incision   Regular diet; Unless operation of lip- then clear liquids for 48 hours (Sip from a cup: do not use a straw)   ABSOLUTELY NO NICOTINE OF ANY TYPE    Wound Care:   Dermabond was used above the lip, you may shower 24 hours post operation, but do not scrub, rub or pick at adhesive glue   Keep all incisions clean   The sutures on the inside of the mouth will fall out on their own.     Limitations:   No swimming, hot tub, sauna or soaking in a bathtub    Prescriptions:   Take exactly as prescribed    Follow-Up:   Call office for an appointment in 3-4 weeks, unless otherwise instructed by Dr. Jack Quarto our office if you experience any of the following:  . Develop a fever (temperature is greater than 100.8F)  . Develop redness greater than 1 cm around incision or red streaks up         extremity  . Have any excess bleeding/ increased drainage or swelling at the             incision site

## 2018-01-09 NOTE — Anesthesia Post-Procedure Evaluation (Signed)
Department of Anesthesiology  Postprocedure Note    Patient: Bradley Oconnell  MRN: [id number]  Birthdate: 22-Mar-1956  Date of evaluation: 01/09/2018  Time:  4:03 PM     Procedure Summary     Date:  01/09/18 Room / Location:  [facility] OR 01 / [place]    Anesthesia Start:  6701 Anesthesia Stop:  1603    Procedure:  MOHS REPAIR BCC RIGHT UPPER LIP (Right Face) Diagnosis:  ([place] RIGHT UPPER LIP)    Surgeon:  [name], MD Responsible Provider:  Betti Cruz, DO    Anesthesia Type:  MAC ASA Status:  2          Anesthesia Type: MAC    [place] Phase I:      Aldrete Phase II:      Last vitals: Reviewed and per EMR flowsheets.       Anesthesia Post Evaluation    Patient location during evaluation: PACU  Patient participation: complete - patient participated  Level of consciousness: awake  Airway patency: patent  Nausea & Vomiting: no vomiting and no nausea  Complications: no  Cardiovascular status: hemodynamically stable  Respiratory status: acceptable  Hydration status: stable

## 2018-01-11 LAB — CULTURE, MRSA, SCREENING

## 2018-04-22 ENCOUNTER — Ambulatory Visit: Admit: 2018-04-22 | Discharge: 2018-04-22 | Payer: MEDICARE | Attending: Family Medicine

## 2018-04-22 DIAGNOSIS — R5383 Other fatigue: Secondary | ICD-10-CM

## 2018-04-22 MED ORDER — MODAFINIL 200 MG PO TABS
200 MG | ORAL_TABLET | Freq: Every day | ORAL | 2 refills | Status: DC | PRN
Start: 2018-04-22 — End: 2018-04-28

## 2018-04-22 MED ORDER — QUETIAPINE FUMARATE 25 MG PO TABS
25 MG | ORAL_TABLET | Freq: Every evening | ORAL | 5 refills | Status: DC | PRN
Start: 2018-04-22 — End: 2019-03-30

## 2018-04-22 MED ORDER — TRANYLCYPROMINE SULFATE 10 MG PO TABS
10 MG | ORAL_TABLET | Freq: Three times a day (TID) | ORAL | 3 refills | Status: DC
Start: 2018-04-22 — End: 2019-03-30

## 2018-04-22 MED ORDER — TOPIRAMATE 100 MG PO TABS
100 MG | ORAL_TABLET | Freq: Three times a day (TID) | ORAL | 3 refills | Status: DC
Start: 2018-04-22 — End: 2018-05-05

## 2018-04-22 MED ORDER — ETODOLAC ER 600 MG PO TB24
600 MG | ORAL_TABLET | Freq: Two times a day (BID) | ORAL | 3 refills | Status: DC
Start: 2018-04-22 — End: 2018-11-09

## 2018-04-22 MED ORDER — DOXYCYCLINE HYCLATE 100 MG PO TABS
100 MG | ORAL_TABLET | Freq: Two times a day (BID) | ORAL | 0 refills | Status: AC
Start: 2018-04-22 — End: 2018-05-22

## 2018-04-22 NOTE — Telephone Encounter (Signed)
Patient dropped off both [employer] hearing aids and the charger. Requested that we send all in to [facility] for repair. After wearing the hearing aids for 3 hours he gets a low battery signal. Hearing aids and charger are in warranty. Sent all to [facility]. Will contact patient when they return.

## 2018-04-22 NOTE — Patient Instructions (Signed)
Patient Education        Depression Treatment: Care Instructions  Your Care Instructions    Depression is a condition that affects the way you feel, think, and act. It causes symptoms such as low energy, loss of interest in daily activities, and sadness or grouchiness that goes on for a long time. Depression is very common and affects men and women of all ages.  Depression is a medical illness caused by changes in the natural chemicals in your brain. It is not a character flaw, and it does not mean that you are a bad or weak person. It does not mean that you are going crazy.  It is important to know that depression can be treated. Medicines, counseling, and self-care can all help. Many people do not get help because they are embarrassed or think that they will get over the depression on their own. But some people do not get better without treatment.  Follow-up care is a key part of your treatment and safety. Be sure to make and go to all appointments, and call your doctor if you are having problems. It's also a good idea to know your test results and keep a list of the medicines you take.  How can you care for yourself at home?  Learn about antidepressant medicines  Antidepressant medicines can improve or end the symptoms of depression. You may need to take the medicine for at least 6 months, and often longer. Keep taking your medicine even if you feel better. If you stop taking it too soon, your symptoms may come back or get worse.  You may start to feel better within 1 to 3 weeks of taking antidepressant medicine. But it can take as many as 6 to 8 weeks to see more improvement. Talk to your doctor if you have problems with your medicine or if you do not notice any improvement after 3 weeks.  Antidepressants can make you feel tired, dizzy, or nervous. Some people have dry mouth, constipation, headaches, sexual problems, an upset stomach, or diarrhea. Many of these side effects are mild and go away on their own  after you take the medicine for a few weeks. Some may last longer. Talk to your doctor if side effects bother you too much. You might be able to try a different medicine. If you are pregnant or breastfeeding, talk to your doctor about what medicines you can take.  Learn about counseling  In many cases, counseling can work as well as medicines to treat mild to moderate depression. Counseling is done by licensed mental health providers, such as psychologists, social workers, and some types of nurses. It can be done in one-on-one sessions or in a group setting. Many people find group sessions helpful.  Cognitive-behavioral therapy is a type of counseling. In this treatment therapy, you learn how to see and change unhelpful thinking styles that may be adding to your depression. Counseling and medicines often work well when used together.  To manage depression  ?? Be physically active. Getting 30 minutes of exercise each day is good for your body and your mind. Begin slowly if it is hard for you to get started. If you already exercise, keep it up.  ?? Plan something pleasant for yourself every day. Include activities that you have enjoyed in the past.  ?? Get enough sleep. Talk to your doctor if you have problems sleeping.  ?? Eat a balanced diet. If you do not feel hungry, eat small snacks rather   than large meals.  ?? Do not drink alcohol, use illegal drugs, or take medicines that your doctor has not prescribed for you. They may interfere with your treatment.  ?? Spend time with family and friends. It may help to speak openly about your depression with people you trust.  ?? Take your medicines exactly as prescribed. Call your doctor if you think you are having a problem with your medicine.  ?? Do not make major life decisions while you are depressed. Depression may change the way you think. You will be able to make better decisions after you feel better.  ?? Think positively. Challenge negative thoughts with statements such as  "I am hopeful"; "Things will get better"; and "I can ask for the help I need." Write down these statements and read them often, even if you don't believe them yet.  ?? Be patient with yourself. It took time for your depression to develop, and it will take time for your symptoms to improve. Do not take on too much or be too hard on yourself.  ?? Learn all you can about depression from written and online materials.  ?? Check out behavioral health classes to learn more about dealing with depression.  ?? Keep the numbers for these national suicide hotlines: 1-800-273-TALK (567)592-6067) and 1-800-SUICIDE 717-806-6932). If you or someone you know talks about suicide or feeling hopeless, get help right away.  When should you call for help?  Call 911 anytime you think you may need emergency care. For example, call if:  ?? ?? You feel you cannot stop from hurting yourself or someone else.   ??Call your doctor now or seek immediate medical care if:  ?? ?? You hear voices.   ?? ?? You feel much more depressed.   ??Watch closely for changes in your health, and be sure to contact your doctor if:  ?? ?? You are having problems with your depression medicine.   ?? ?? You are not getting better as expected.   Where can you learn more?  Go to [email or web address] and sign in to your MyChart account. Enter [phone] in the [place] box to learn more about "Depression Treatment: Care Instructions."     If you do not have an account, please click on the "Sign Up Now" link.  Current as of: April 01, 2017  Content Version: 12.1  ?? 2006-2019 [employer]wise, [employer]. Care instructions adapted under license by [facility]. If you have questions about a medical condition or this instruction, always ask your healthcare professional. [place] any warranty or liability for your use of this information.         Patient Education        Fatigue: Care Instructions  Your Care  Instructions    Fatigue is a feeling of tiredness, exhaustion, or lack of energy. You may feel fatigue because of too much or not enough activity. It can also come from stress, lack of sleep, boredom, and poor diet. Many medical problems, such as viral infections, can cause fatigue. Emotional problems, especially depression, are often the cause of fatigue.  Fatigue is most often a symptom of another problem. Treatment for fatigue depends on the cause. For example, if you have fatigue because you have a certain health problem, treating this problem also treats your fatigue. If depression or anxiety is the cause, treatment may help.  Follow-up care is a key part of your treatment and safety. Be sure to make and go to all appointments,  and call your doctor if you are having problems. It's also a good idea to know your test results and keep a list of the medicines you take.  How can you care for yourself at home?  ?? Get regular exercise. But don't overdo it. Go back and forth between rest and exercise.  ?? Get plenty of rest.  ?? Eat a healthy diet. Do not skip meals, especially breakfast.  ?? Reduce your use of caffeine, tobacco, and alcohol. Caffeine is most often found in coffee, tea, cola drinks, and chocolate.  ?? Limit medicines that can cause fatigue. This includes tranquilizers and cold and allergy medicines.  When should you call for help?  Watch closely for changes in your health, and be sure to contact your doctor if:  ?? ?? You have new symptoms such as fever or a rash.   ?? ?? Your fatigue gets worse.   ?? ?? You have been feeling down, depressed, or hopeless. Or you may have lost interest in things that you usually enjoy.   ?? ?? You are not getting better as expected.   Where can you learn more?  Go to [email or web address] and sign in to your MyChart account. Enter [phone] in the [place] box to learn more about "Fatigue: Care Instructions."     If you do not have an account, please  click on the "Sign Up Now" link.  Current as of: April 13, 2017  Content Version: 12.1  ?? 2006-2019 Healthwise, [employer]. Care instructions adapted under license by [facility]. If you have questions about a medical condition or this instruction, always ask your healthcare professional. [place] any warranty or liability for your use of this information.

## 2018-04-22 NOTE — Progress Notes (Signed)
Vaccine Information Sheet, "Influenza - Inactivated"  given to [name], or parent/legal guardian of  TYRENCE FANTASIA and verbalized understanding.    Patient responses:    Have you ever had a reaction to a flu vaccine? No  Do you have an allergy to eggs, neomycin or polymixin?  No  Do you have an allergy to Thimerosal, contact lens solution, or Merthiolate? No  Have you ever had Guillian Barre Syndrome?  No  Do you have any current illness?  No  Do you have a temperature above 100 degrees? No  Are you pregnant? No  If pregnant, permission obtained from physician? N/A  Do you have an active neurological disorder? No      Flu vaccine given per order. Please see immunization tab.    After obtaining consent, and per orders of Dr. Vianne Bulls, injection of Influenza vaccine 0.5 mL IM given by Teodoro Kil. Patient tolerated well.    Immunizations Administered     Name Date Dose Route    Influenza, Quadv, IM, PF (6 mo and older Fluzone, Flulaval, Fluarix, and 3 yrs and older Afluria) 04/22/2018 0.5 mL Intramuscular    Site: Deltoid- Right    Lot: [id number]    NDC: 29562-130-86

## 2018-04-22 NOTE — Progress Notes (Signed)
Subjective:      Patient ID: Bradley Oconnell is a 62 y.o. male.    HPI  Pt here for a check up.  Reviewed BMI of 31.  Encouraged diet, exercise and weight loss.  Reviewed health maintenance, needs flu shot.  Needs med refills.  Check ears.  Partnered, former smoker, pmh reviewed.     Review of Systems   Constitutional: Positive for fatigue. Negative for chills, fever and unexpected weight change.   HENT: Negative for congestion, ear pain, rhinorrhea and sore throat.    Eyes: Negative for pain and visual disturbance.   Respiratory: Negative for cough, chest tightness, shortness of breath and wheezing.    Cardiovascular: Negative for chest pain and palpitations.   Gastrointestinal: Negative for abdominal pain, blood in stool, constipation, diarrhea, nausea and vomiting.   Genitourinary: Negative for difficulty urinating, frequency, hematuria and urgency.   Musculoskeletal: Negative for back pain, joint swelling, myalgias and neck pain.   Skin: Negative for rash.   Neurological: Negative for dizziness and headaches.   Hematological: Negative for adenopathy. Does not bruise/bleed easily.   Psychiatric/Behavioral: Negative for behavioral problems and sleep disturbance. The patient is not nervous/anxious.        Objective:   Physical Exam   Constitutional: He is oriented to person, place, and time. He appears well-developed and well-nourished.   HENT:   Head: Normocephalic and atraumatic.   Right Ear: External ear normal.   Left Ear: External ear normal.   Nose: Nose normal.   Mouth/Throat: Oropharynx is clear and moist.   Eyes: Pupils are equal, round, and reactive to light. EOM are normal.   Neck: Neck supple. Carotid bruit is not present. No thyromegaly present.   Cardiovascular: Normal rate, regular rhythm and normal heart sounds.   Pulmonary/Chest: Breath sounds normal. He has no wheezes. He has no rales.   Abdominal: Soft. Bowel sounds are normal. There is no tenderness. There is no rebound and no guarding.    Musculoskeletal: Normal range of motion. He exhibits no edema.   Lymphadenopathy:     He has no cervical adenopathy.   Neurological: He is alert and oriented to person, place, and time. He has normal reflexes. No cranial nerve deficit.   Skin: Skin is warm and dry. No rash noted.   Psychiatric: He has a normal mood and affect.   Nursing note and vitals reviewed.    Health Maintenance   Topic Date Due   ??? DTaP/Tdap/Td vaccine (1 - Tdap) 07/05/1973   ??? Annual Wellness Visit (AWV)  12/17/2017   ??? Flu vaccine (1) 03/22/2018   ??? Lipid screen  12/05/2022   ??? Colon cancer screen colonoscopy  04/16/2023   ??? Shingles Vaccine  Completed   ??? Hepatitis C screen  Completed   ??? HIV screen  Completed   ??? Pneumococcal 0-64 years Vaccine  Aged Out     Lab Results   Component Value Date    CHOL 179 12/04/2017    CHOL 174 05/22/2012    CHOL 173 02/26/2010     Lab Results   Component Value Date    TRIG 48 12/04/2017    TRIG 76 05/22/2012    TRIG 79 02/26/2010     Lab Results   Component Value Date    HDL 66 12/04/2017    HDL 75 05/22/2012    HDL 62 02/26/2010     Lab Results   Component Value Date    LDLCALC 103 12/04/2017  LDLCALC 84 05/22/2012    [place] 95 02/26/2010     No results found for: LABVLDL, VLDL  No results found for: [facility]  Lab Results   Component Value Date    NA 141 01/08/2018    K 4.8 01/08/2018    CL 103 01/08/2018    CO2 24 01/08/2018    BUN 17 01/08/2018    CREATININE 0.9 01/08/2018    GLUCOSE 81 01/08/2018    CALCIUM 9.5 01/08/2018    PROT 6.9 12/04/2017    LABALBU 4.0 12/04/2017    BILITOT 0.3 12/04/2017    ALKPHOS 64 12/04/2017    AST 20 12/04/2017    ALT 13 12/04/2017    LABGLOM 85 (A) 01/08/2018         Assessment:      Fatigue  Recurrent depression  Rosacea        Plan:      Refill meds  Reviewed bloo dwork  Doxy for rosacea  Add flomax for frequency  Encouraged diet, exercise and weight loss.  Dash diet  Reviewed health maintenance  Flu shot    Controlled Substance Monitoring:    Acute and Chronic  Pain Monitoring:   RX Monitoring 04/22/2018   Attestation -   Periodic Controlled Substance Monitoring No signs of potential drug abuse or diversion identified.;Possible medication side effects, risk of tolerance/dependence & alternative treatments discussed.                 Sheila Oats, MD

## 2018-04-24 MED ORDER — TAMSULOSIN HCL 0.4 MG PO CAPS
0.4 MG | ORAL_CAPSULE | Freq: Every day | ORAL | 0 refills | Status: DC
Start: 2018-04-24 — End: 2019-03-30

## 2018-04-24 NOTE — Telephone Encounter (Signed)
Pt notified.

## 2018-04-24 NOTE — Telephone Encounter (Signed)
ep'ed flomax to pharm requested

## 2018-04-24 NOTE — Telephone Encounter (Signed)
Pt Bradley Oconnell says he recalls at his recent appt you two discussed him trying Flomax for 1 mth. He checked at [facility], and found you did not write it? Stresses that he only wants a 1 mth trial.    Pls advise. Pls call pt at [phone].  Last ov: 04/22/18 fatigue

## 2018-04-28 ENCOUNTER — Encounter

## 2018-04-28 MED ORDER — MODAFINIL 200 MG PO TABS
200 MG | ORAL_TABLET | Freq: Every day | ORAL | 2 refills | Status: AC | PRN
Start: 2018-04-28 — End: 2018-05-28

## 2018-05-05 ENCOUNTER — Encounter

## 2018-05-05 MED ORDER — TOPIRAMATE 100 MG PO TABS
100 MG | ORAL_TABLET | Freq: Three times a day (TID) | ORAL | 3 refills | Status: DC
Start: 2018-05-05 — End: 2018-11-09

## 2018-05-05 NOTE — Telephone Encounter (Signed)
Patient states that his mail away, [facility], told him that they do not have a Rx for his Topamax.  They got all of the other Rx's, but not that one.  Can you resend it?  Call him back at [phone] if any problems

## 2018-05-05 NOTE — Telephone Encounter (Signed)
ep'ed topamax to pharm requested

## 2018-05-08 ENCOUNTER — Inpatient Hospital Stay: Admit: 2018-05-08 | Discharge: 2018-05-08 | Payer: MEDICARE | Attending: Audiologist

## 2018-05-08 NOTE — Progress Notes (Signed)
Patient picked up both [employer] hearing aids and [employer]. This was a in warranty repair (no charge to patient).

## 2018-05-13 NOTE — Telephone Encounter (Signed)
Pt notified of modafinil denial and letter of appeal.

## 2018-05-14 NOTE — Telephone Encounter (Signed)
[employer] pharmacy review called regarding modafinil appeal. Requesting additional information including sleep study, symptoms. [employer] will send appeal decision tomorrow.

## 2018-05-19 NOTE — Telephone Encounter (Signed)
Pt calling stating he is needing a p/a done on topamax 100mg  - 1 po tid #270/3.  P/A submitted on covermymeds and received an approval.  Case ID [id number] - good 02/17/2018 - 05/19/2019.    Spoke with [facility] and they received the approval.  Cost will be $8.86 for 260 tablets.  Pt will need to contact [facility] and request a refill.  Left message for pt regarding this information.

## 2018-11-09 ENCOUNTER — Telehealth: Admit: 2018-11-10 | Payer: MEDICARE | Attending: Family Medicine

## 2018-11-09 DIAGNOSIS — Z0000 Encounter for general adult medical examination without abnormal findings: Secondary | ICD-10-CM

## 2018-11-09 MED ORDER — ETODOLAC ER 600 MG PO TB24
600 MG | ORAL_TABLET | Freq: Two times a day (BID) | ORAL | 1 refills | Status: DC
Start: 2018-11-09 — End: 2019-02-19

## 2018-11-09 NOTE — Progress Notes (Signed)
Medicare Annual Wellness Visit  Name: Bradley Oconnell Today???s Date: 11/09/2018   MRN: [id number] Sex: Male   Age: 63 y.o. Ethnicity: Non-Hispanic/Non Latino   DOB: May 06, 1956 Race: Bradley Oconnell is here for Medicare AWV    Screenings for behavioral, psychosocial and functional/safety risks, and cognitive dysfunction are all negative except as indicated below. These results, as well as other patient data from the [place] form, are documented in Flowsheets linked to this Encounter.    Allergies   Allergen Reactions   ??? Gabapentin Swelling       Prior to Visit Medications    Medication Sig Taking? Authorizing Provider   tamsulosin (FLOMAX) 0.4 MG capsule Take 1 capsule by mouth daily  Sheila Oats, MD   tranylcypromine (PARNATE) 10 MG tablet Take 1 tablet by mouth 3 times daily  Sheila Oats, MD   etodolac (LODINE XL) 600 MG extended release tablet Take 1 tablet by mouth 2 times daily  Sheila Oats, MD   QUEtiapine (SEROQUEL) 25 MG tablet Take 1 tablet by mouth nightly as needed (sleep) TAKE 1 TABLET BY MOUTH ONE TIME A DAY AS NEEDED FOR SLEEP  Ela Moffat A Lynna Zamorano, MD   ketorolac (TORADOL) 10 MG tablet Take 1 tablet by mouth every 6 hours as needed for Pain  Sheila Oats, MD   Handicap Placard MISC by Does not apply route Request parking placard due to medical conditions.  Duration of 5 years.  Sheila Oats, MD   Biotin 1000 MCG TABS Take by mouth  Historical Provider, MD   finasteride (PROSCAR) 5 MG tablet Take 1 tablet by mouth daily  Sheila Oats, MD   oxybutynin (DITROPAN) 5 MG tablet Take 1 tablet by mouth 2 times daily.  Sheila Oats, MD       Past Medical History:   Diagnosis Date   ??? Cervical pain (neck)    ??? Depression, recurrent ([name])    ??? ED (erectile dysfunction)    ??? Genital warts    ??? OA (osteoarthritis)    ??? OSA (obstructive sleep apnea)    ??? Testosterone deficiency        Past Surgical History:   Procedure Laterality Date   ??? COLONOSCOPY       polyps   ??? FOOT SURGERY Bilateral     x 4   ??? FOOT SURGERY Left 07/19/2014   ??? HEMORRHOID SURGERY  2006   ??? HERNIA REPAIR  2010   ??? JOINT REPLACEMENT Left 10/28/2016    knee   ??? MOHS SURGERY Right 01/09/2018    MOHS REPAIR BCC RIGHT UPPER LIP performed by [name], MD at [place]   ??? ROTATOR [place] Right    ??? UPPER GASTROINTESTINAL ENDOSCOPY  04/15/13    DR. Ilsa Iha NO BX.       Family History   Problem Relation Age of Onset   ??? Parkinsonism Mother    ??? Arthritis Mother    ??? Heart Disease Father         Congestive Heart   ??? Cancer Father    ??? Stroke Father    ??? Arthritis Father    ??? Depression Father    ??? Depression Son    ??? Early Death Son    ??? Asthma Neg Hx    ??? Birth Defects Neg Hx    ??? Diabetes Neg Hx    ??? Hearing  Loss Neg Hx    ??? High Blood Pressure Neg Hx    ??? High Cholesterol Neg Hx    ??? Kidney Disease Neg Hx    ??? Learning Disabilities Neg Hx    ??? Mental Illness Neg Hx    ??? Mental Retardation Neg Hx    ??? Miscarriages / Stillbirths Neg Hx    ??? Substance Abuse Neg Hx    ??? Vision Loss Neg Hx    ??? Other Neg Hx        CareTeam (Including outside providers/suppliers regularly involved in providing care):   Patient Care Team:  Sheila Oats, MD as PCP - General (Family Medicine)  Sheila Oats, MD as PCP - [facility] Empaneled Provider  Ovidio Kin, MD as Physician (Rheumatology)  Felizardo Hoffmann, MD as Physician (Orthopedic Surgery)    Wt Readings from Last 3 Encounters:   11/09/18 215 lb (97.5 kg)   04/22/18 201 lb (91.2 kg)   01/09/18 203 lb (92.1 kg)     Vitals:    11/09/18 0831   BP: 125/75   Pulse: 72   Weight: 215 lb (97.5 kg)   Height: 5\' 7"  (1.702 m)     Body mass index is 33.67 kg/m??.    Based upon direct observation of the patient, evaluation of cognition reveals recent and remote memory intact.        Patient's complete Health Risk Assessment and screening values have been reviewed and are found in Flowsheets. The following problems were reviewed today and where  indicated follow up appointments were made and/or referrals ordered.    Positive Risk Factor Screenings with Interventions:     Health Habits/Nutrition:     Body mass index is 33.67 kg/m??.  Health Habits/Nutrition Interventions:  ?? Inadequate physical activity:  educational materials provided to promote increased physical activity    Hearing/Vision:  No exam data present     Hearing/Vision Interventions:  ?? Hearing concerns:  has known hearing aides     Has a living will    Personalized Preventive Plan   Current Health Maintenance Status  Immunization History   Administered Date(s) Administered   ??? Influenza A (H1N1-09) Vaccine PF IM 05/18/2008   ??? Influenza Virus Vaccine 05/06/2016   ??? Influenza, Quadv, IM, (6 mo and older Fluzone, Flulaval, Fluarix and 3 yrs and older Afluria) 05/03/2015, 05/19/2017   ??? Influenza, Quadv, IM, PF (6 mo and older Fluzone, Flulaval, Fluarix, and 3 yrs and older Afluria) 04/22/2018   ??? Pneumococcal Polysaccharide (Pneumovax23) 05/19/2017   ??? Zoster Live (Zostavax) 09/03/2011   ??? Zoster Recombinant (Shingrix) 01/06/2018, 03/16/2018        Health Maintenance   Topic Date Due   ??? DTaP/Tdap/Td vaccine (1 - Tdap) 07/05/1973   ??? Annual Wellness Visit (AWV)  12/17/2017   ??? Lipid screen  12/05/2022   ??? Colon cancer screen colonoscopy  04/16/2023   ??? Flu vaccine  Completed   ??? Shingles Vaccine  Completed   ??? Hepatitis C screen  Completed   ??? HIV screen  Completed   ??? Hepatitis A vaccine  Aged Out   ??? Hepatitis B vaccine  Aged Out   ??? Hib vaccine  Aged Out   ??? Meningococcal (ACWY) vaccine  Aged Out   ??? Pneumococcal 0-64 years Vaccine  Aged Out     Recommendations for [employer] Due: see orders and patient instructions/AVS.  .  Recommended screening schedule for the next 5-10 years  is provided to the patient in written form: see Patient Instructions/AVS.    There are no diagnoses linked to this encounter.      Bradley Oconnell is a 63 y.o. male being evaluated by a Virtual Visit (video  visit) encounter to address concerns as mentioned above.  A caregiver was present when appropriate. Due to this being a Scientist, physiological (During ZOXWR-60 public health emergency), evaluation of the following organ systems was limited: Vitals/Constitutional/EENT/Resp/CV/GI/GU/MS/Neuro/Skin/Heme-Lymph-Imm.  Pursuant to the emergency declaration under the [place] waiver authority and the [employer] Act, this Virtual Visit was conducted with patient's (and/or legal guardian's) consent, to reduce the patient's risk of exposure to COVID-19 and provide necessary medical care.  The patient (and/or legal guardian) has also been advised to contact this office for worsening conditions or problems, and seek emergency medical treatment and/or call 911 if deemed necessary.     Services were provided through a video synchronous discussion virtually to substitute for in-person clinic visit. Patient and provider were located at their individual homes.    --Sheila Oats, MD on 11/09/2018 at 8:42 AM    An electronic signature was used to authenticate this note.

## 2018-11-09 NOTE — Patient Instructions (Addendum)
Personalized Preventive Plan for Bradley Oconnell - 11/09/2018  Medicare offers a range of preventive health benefits. Some of the tests and screenings are paid in full while other may be subject to a deductible, co-insurance, and/or copay.    Some of these benefits include a comprehensive review of your medical history including lifestyle, illnesses that may run in your family, and various assessments and screenings as appropriate.    After reviewing your medical record and screening and assessments performed today your provider may have ordered immunizations, labs, imaging, and/or referrals for you.  A list of these orders (if applicable) as well as your Preventive Care list are included within your After Visit Summary for your review.    Other Preventive Recommendations:    ?? A preventive eye exam performed by an eye specialist is recommended every 1-2 years to screen for glaucoma; cataracts, macular degeneration, and other eye disorders.  ?? A preventive dental visit is recommended every 6 months.  ?? Try to get at least 150 minutes of exercise per week or 10,000 steps per day on a pedometer .  ?? Order or download the FREE "Exercise & Physical Activity: Your Everyday Guide" from [employer]. Call [phone] or search [employer] online.  ?? You need 1200-1500 mg of calcium and 1000-2000 IU of vitamin D per day. It is possible to meet your calcium requirement with diet alone, but a vitamin D supplement is usually necessary to meet this goal.  ?? When exposed to the sun, use a sunscreen that protects against both UVA and UVB radiation with an SPF of 30 or greater. Reapply every 2 to 3 hours or after sweating, drying off with a towel, or swimming.  ?? Always wear a seat belt when traveling in a car. Always wear a helmet when riding a bicycle or motorcycle.  Patient Education        Well Visit, Men 28 to 54: Care Instructions  Your Care Instructions    Physical exams can help  you stay healthy. Your doctor has checked your overall health and may have suggested ways to take good care of yourself. He or she also may have recommended tests. At home, you can help prevent illness with healthy eating, regular exercise, and other steps.  Follow-up care is a key part of your treatment and safety. Be sure to make and go to all appointments, and call your doctor if you are having problems. It's also a good idea to know your test results and keep a list of the medicines you take.  How can you care for yourself at home?  Reach and stay at a healthy weight. This will lower your risk for many problems, such as obesity, diabetes, heart disease, and high blood pressure.  Get at least 30 minutes of exercise on most days of the week. Walking is a good choice. You also may want to do other activities, such as running, swimming, cycling, or playing tennis or team sports.  Do not smoke. Smoking can make health problems worse. If you need help quitting, talk to your doctor about stop-smoking programs and medicines. These can increase your chances of quitting for good.  Protect your skin from too much sun. When you're outdoors from 10 a.m. to 4 p.m., stay in the shade or cover up with clothing and a hat with a wide brim. Wear sunglasses that block UV rays. Even when it's cloudy, put broad-spectrum sunscreen (SPF 30 or higher)  on any exposed skin.  See a dentist one or two times a year for checkups and to have your teeth cleaned.  Wear a seat belt in the car.  Follow your doctor's advice about when to have certain tests. These tests can spot problems early.  Cholesterol. Your doctor will tell you how often to have this done based on your overall health and other things that can increase your risk for heart attack and stroke.  Blood pressure. Have your blood pressure checked during a routine doctor visit. Your doctor will tell you how often to check your blood pressure based on your age, your blood pressure  results, and other factors.  Prostate exam. Talk to your doctor about whether you should have a blood test (called a PSA test) for prostate cancer. Experts recommend that you discuss the benefits and risks of the test with your doctor before you decide whether to have this test.  Diabetes. Ask your doctor whether you should have tests for diabetes.  Vision. Some experts recommend that you have yearly exams for glaucoma and other age-related eye problems starting at age 4.  Hearing. Tell your doctor if you notice any change in your hearing. You can have tests to find out how well you hear.  Colorectal cancer. Your risk for colorectal cancer gets higher as you get older. Some experts say that adults should start regular screening at age 6 and stop at age 90. Others say to start before age 65 or continue after age 72. Talk with your doctor about your risk and when to start and stop screening.  Heart attack and stroke risk. At least every 4 to 6 years, you should have your risk for heart attack and stroke assessed. Your doctor uses factors such as your age, blood pressure, cholesterol, and whether you smoke or have diabetes to show what your risk for a heart attack or stroke is over the next 10 years.  Abdominal aortic aneurysm. Ask your doctor whether you should have a test to check for an aneurysm. You may need a test if you ever smoked or if your parent, brother, sister, or child has had an aneurysm.  When should you call for help?  Watch closely for changes in your health, and be sure to contact your doctor if you have any problems or symptoms that concern you.  Where can you learn more?  Go to [email or web address] and sign in to your MyChart account. Enter [phone] in the [place] box to learn more about "Well Visit, Men 40 to 82: Care Instructions."     If you do not have an account, please click on the "Sign Up Now" link.  Current as of: August 21, 2019Content Version: 12.4  ??  2006-2020 Healthwise, Incorporated.  Care instructions adapted under license by [facility]. If you have questions about a medical condition or this instruction, always ask your healthcare professional. [place] any warranty or liability for your use of this information.         Patient Education        Learning About Physical Activity  What is physical activity?    Physical activity is any kind of activity that gets your body moving.  The types of physical activity that can help you get fit and stay healthy include:  Aerobic or "cardio" activities that make your heart beat faster and make you breathe harder, such as brisk walking, riding a bike, or running. Aerobic activities strengthen your heart  and lungs and build up your endurance.  Strength training activities that make your muscles work against, or "resist," something, such as lifting weights or doing push-ups. These activities help tone and strengthen your muscles.  Stretches that allow you to move your joints and muscles through their full range of motion. Stretching helps you be more flexible and avoid injury.  What are the benefits of physical activity?  Being active is one of the best things you can do to get fit and stay healthy. It helps you to:  Feel stronger and have more energy to do all the things you like to do.  Focus better at school or work and perform better in sports.  Feel, think, and sleep better.  Reach and stay at a healthy weight.  Lose fat and build lean muscle.  Lower your risk for serious health problems.  Keep your bones, muscles, and joints strong.  Being fit lets you do more physical activity. And it lets you work out harder without as much effort.  How can you make physical activity part of your life?  Get at least 30 minutes of exercise on most days of the week. Walking is a good choice. You also may want to do other activities, such as running, swimming, cycling, or playing tennis or team sports.  Pick  activities that you like--ones that make your heart beat faster, your muscles stronger, and your muscles and joints more flexible. If you find more than one thing you like doing, do them all. You don't have to do the same thing every day.  Get your heart pumping every day. Any activity that makes your heart beat faster and keeps it at that rate for a while counts.  Here are some great ways to get your heart beating faster:  Go for a brisk walk, run, or bike ride.  Go for a hike or swim.  Go in-line skating.  Play a game of touch football, basketball, or soccer.  Ride a bike.  Play tennis or racquetball.  Climb stairs.  Even some household chores can be aerobic--just do them at a faster pace. Vacuuming, raking or mowing the lawn, sweeping the garage, and washing and waxing the car all can help get your heart rate up.  Strengthen your muscles during the week. You don't have to lift heavy weights or grow big, bulky muscles to get stronger. Doing a few simple activities that make your muscles work against, or "resist," something can help you get stronger.  For example, you can:  Do push-ups or sit-ups, which use your own body weight as resistance.  Lift weights or dumbbells or use stretch bands at home or in a gym or community center.  Stretch your muscles often. Stretching will help you as you become more active. It can help you stay flexible, loosen tight muscles, and avoid injury. It can also help improve your balance and posture and can be a great way to relax.  Be sure to stretch the muscles you'll be using when you work out. It's best to warm your muscles slightly before you stretch them. Walk or do some other light aerobic activity for a few minutes, and then start stretching.  When you stretch your muscles:  Do it slowly. Stretching is not about going fast or making sudden movements.  Don't push or bounce during a stretch.  Hold each stretch for at least 15 to 30 seconds, if you can. You should feel a stretch in  the muscle,  but not pain.  Breathe out as you do the stretch. Then breathe in as you hold the stretch. Don't hold your breath.  If you're worried about how more activity might affect your health, have a checkup before you start. Follow any special advice your doctor gives you for getting a smart start.  Where can you learn more?  Go to [email or web address] and sign in to your MyChart account. Enter [phone] in the [place] box to learn more about "Learning About Physical Activity."     If you do not have an account, please click on the "Sign Up Now" link.  Current as of: May 5, 2019Content Version: 12.4  ?? 2006-2020 Healthwise, Incorporated.  Care instructions adapted under license by [facility]. If you have questions about a medical condition or this instruction, always ask your healthcare professional. [place] any warranty or liability for your use of this information.         Patient Education        Eating Healthy Foods: Care Instructions  Your Care Instructions    Eating healthy foods can help lower your risk for disease. Healthy food gives you energy and keeps your heart strong, your brain active, your muscles working, and your bones strong.  A healthy diet includes a variety of foods from the basic food groups: grains, vegetables, fruits, milk and milk products, and meat and beans. Some people may eat more of their favorite foods from only one food group and, as a result, miss getting the nutrients they need. So, it is important to pay attention not only to what you eat but also to what you are missing from your diet. You can eat a healthy, balanced diet by making a few small changes.  Follow-up care is a key part of your treatment and safety. Be sure to make and go to all appointments, and call your doctor if you are having problems. It's also a good idea to know your test results and keep a list of the medicines you take.  How can you care for  yourself at home?  Look at what you eat  Keep a food diary for a week or two and record everything you eat or drink. Track the number of servings you eat from each food group.  For a balanced diet every day, eat a variety of:  6 or more ounce-equivalents of grains, such as cereals, breads, crackers, rice, or pasta, every day. An ounce-equivalent is 1 slice of bread, 1 cup of ready-to-eat cereal, or ?? cup of cooked rice, cooked pasta, or cooked cereal.  2?? cups of vegetables, especially:  Dark-green vegetables such as broccoli and spinach.  Orange vegetables such as carrots and sweet potatoes.  Dry beans (such as pinto and kidney beans) and peas (such as lentils).  2 cups of fresh, frozen, or canned fruit. A small apple or 1 banana or orange equals 1 cup.  3 cups of nonfat or low-fat milk, yogurt, or other milk products.  5?? ounces of meat and beans, such as chicken, fish, lean meat, beans, nuts, and seeds. One egg, 1 tablespoon of peanut butter, ?? ounce nuts or seeds, or ?? cup of cooked beans equals 1 ounce of meat.  Learn how to read food labels for serving sizes and ingredients. Fast-food and convenience-food meals often contain few or no fruits or vegetables. Make sure you eat some fruits and vegetables to make the meal more nutritious.  Look at your food diary.  For each food group, add up what you have eaten and then divide the total by the number of days. This will give you an idea of how much you are eating from each food group. See if you can find some ways to change your diet to make it more healthy.  Start small  Do not try to make dramatic changes to your diet all at once. You might feel that you are missing out on your favorite foods and then be more likely to fail.  Start slowly, and gradually change your habits. Try some of the following:  Use whole wheat bread instead of white bread.  Use nonfat or low-fat milk instead of whole milk.  Eat brown rice instead of white rice, and eat whole wheat pasta  instead of white-flour pasta.  Try low-fat cheeses and low-fat yogurt.  Add more fruits and vegetables to meals and have them for snacks.  Add lettuce, tomato, cucumber, and onion to sandwiches.  Add fruit to yogurt and cereal.  Enjoy food  You can still eat your favorite foods. You just may need to eat less of them. If your favorite foods are high in fat, salt, and sugar, limit how often you eat them, but do not cut them out entirely.  Eat a wide variety of foods.  Make healthy choices when eating out  The type of restaurant you choose can help you make healthy choices. Even fast-food chains are now offering more low-fat or healthier choices on the menu.  Choose smaller portions, or take half of your meal home.  When eating out, try:  A veggie pizza with a whole wheat crust or grilled chicken (instead of sausage or pepperoni).  Pasta with roasted vegetables, grilled chicken, or marinara sauce instead of cream sauce.  A vegetable wrap or grilled chicken wrap.  Broiled or poached food instead of fried or breaded items.  Make healthy choices easy  Buy packaged, prewashed, ready-to-eat fresh vegetables and fruits, such as baby carrots, salad mixes, and chopped or shredded broccoli and cauliflower.  Buy packaged, presliced fruits, such as melon or pineapple.  Choose 100% fruit or vegetable juice instead of soda. Limit juice intake to 4 to 6 oz (?? to ?? cup) a day.  Blend low-fat yogurt, fruit juice, and canned or frozen fruit to make a smoothie for breakfast or a snack.  Where can you learn more?  Go to [email or web address] and sign in to your MyChart account. Enter [phone] in the [place] box to learn more about "Eating Healthy Foods: Care Instructions."     If you do not have an account, please click on the "Sign Up Now" link.  Current as of: August 21, 2019Content Version: 12.4  ?? 2006-2020 Healthwise, Incorporated.  Care instructions adapted under license by [facility]. If you have  questions about a medical condition or this instruction, always ask your healthcare professional. [place] any warranty or liability for your use of this information.

## 2019-01-14 NOTE — Telephone Encounter (Signed)
Patient dropped off both hearing aids.  He states that his batteries some days only last 2-4- hours.  It doesn't do it all of the time, but he wants his hearing aids sent in since they are still in warranty.

## 2019-01-14 NOTE — Telephone Encounter (Signed)
Hearing aids sent in on this date.  The charger was sent as well.

## 2019-01-28 ENCOUNTER — Inpatient Hospital Stay: Admit: 2019-01-28 | Discharge: 2019-01-28 | Payer: MEDICARE | Attending: Audiologist

## 2019-01-28 NOTE — Telephone Encounter (Signed)
Please call the patient to pick up his repaired hearing aids and charger.  Both hearing aids and the charger were service replaced.  N/C under warranty.

## 2019-01-28 NOTE — Progress Notes (Signed)
Patient picked up his repaired hearing aids and charger. N/C

## 2019-02-19 ENCOUNTER — Encounter

## 2019-02-19 MED ORDER — ETODOLAC ER 600 MG PO TB24
600 MG | ORAL_TABLET | ORAL | 0 refills | Status: DC
Start: 2019-02-19 — End: 2019-03-30

## 2019-02-19 NOTE — Telephone Encounter (Signed)
ep'ed lodine to pharm requested

## 2019-02-19 NOTE — Telephone Encounter (Signed)
Bradley Oconnell needs refill of   Requested Prescriptions     Pending Prescriptions Disp Refills   ??? etodolac (LODINE XL) 600 MG extended release tablet [Pharmacy Med Name: Etodolac ER 600 MG Oral Tablet Extended Release 24 Hour] 180 tablet 0     Sig: Take 1 tablet by mouth twice daily       Last Filled on:  11/09/2018    Last Visit Date:  04/22/2018    Next Visit Date:    Visit date not found       Pt states the refill was filled in [facility] and he needs new Rx

## 2019-03-02 NOTE — Telephone Encounter (Signed)
Patient dropped off both of his hearing aids.  They are saying low battery after wearing them 3-4 hours.  Patient states that he just sent them in July for the same thing.  His warranty expires on 03-06-2019. Please evaluate.

## 2019-03-02 NOTE — Telephone Encounter (Signed)
A listening check of the hearing aids revealed normal output and they appeared to be very clean. I called Bradley Oconnell due to the frequent issue of the battery dying within 3-4 hours. The patient is concerned because his warranty will expire 03/06/19. Customer service explained that they will honor their repairs for 30 days. After that, the patient would need to pay for repairs. I asked what he can do to prevent this problem. She stated that foreign material has been an issue in the past, she is unsure where the foreign material is. Can be a moisture issue as well. Will talk to the patient about an electronic dryer to see if this helps resolve the situation. Sent hearing aids out today. Will call patient when they come in.

## 2019-03-12 NOTE — Telephone Encounter (Signed)
Notified patient that his hearing aids and charger were in. [facility] did not send the charger cord. Called [name]- they will send a new cord right away. Loaned one of our cords to the patient. He expressed concern about this problem happening multiple times. I explained that the hearing aids have been replaced and they did not find any fault with the charger. I explained that [name] found foreign material in the hearing aid (they did not specify where)- I told him that it could be moisture, wax or debris in the microphones. He admitted that he does not brush the hearing aid microphones routinely. I recommended an electronic dryer. We can always try our loaner charger if this happens again, but most likely it is the "foreign material" that is causing the problem. The patient expressed understanding.

## 2019-03-30 ENCOUNTER — Ambulatory Visit: Admit: 2019-03-30 | Discharge: 2019-03-30 | Payer: MEDICARE | Attending: Family Medicine

## 2019-03-30 DIAGNOSIS — Z0000 Encounter for general adult medical examination without abnormal findings: Secondary | ICD-10-CM

## 2019-03-30 LAB — CBC WITH AUTO DIFFERENTIAL
Basophils Absolute: 0 10*3/uL (ref 0.0–0.1)
Basophils: 0.7 %
Eosinophils Absolute: 0.2 10*3/uL (ref 0.0–0.4)
Eosinophils: 5.1 %
Hematocrit: 45 % (ref 42.0–52.0)
Hemoglobin: 14.7 gm/dl (ref 14.0–18.0)
Immature Grans (Abs): 0.02 10*3/uL (ref 0.00–0.07)
Immature Granulocytes %: 0.4 %
Lymphocytes Absolute: 1 10*3/uL (ref 1.0–4.8)
Lymphocytes: 23.3 %
MCH: 32.2 pg (ref 26.0–33.0)
MCHC: 32.7 gm/dl (ref 32.2–35.5)
MCV: 98.5 fL — ABNORMAL HIGH (ref 80.0–94.0)
MPV: 11 fL (ref 9.4–12.4)
Monocytes %: 8.8 %
Monocytes Absolute: 0.4 10*3/uL (ref 0.4–1.3)
Neutrophils Absolute: 2.8 10*3/uL (ref 1.8–7.7)
Platelets: 206 10*3/uL (ref 130–400)
RBC: 4.57 10*6/uL — ABNORMAL LOW (ref 4.70–6.10)
RDW-CV: 13.1 % (ref 11.5–14.5)
RDW-SD: 46.8 fL — ABNORMAL HIGH (ref 35.0–45.0)
Seg Neutrophils: 61.7 %
WBC: 4.5 10*3/uL — ABNORMAL LOW (ref 4.8–10.8)
nRBC: 0 /100 wbc

## 2019-03-30 LAB — COMPREHENSIVE METABOLIC PANEL
ALT: 40 U/L (ref 11–66)
AST: 38 U/L (ref 5–40)
Albumin: 4.2 g/dL (ref 3.5–5.1)
Alkaline Phosphatase: 69 U/L (ref 38–126)
BUN: 21 mg/dL (ref 7–22)
CO2: 26 meq/L (ref 23–33)
Calcium: 9.1 mg/dL (ref 8.5–10.5)
Chloride: 105 meq/L (ref 98–111)
Creatinine: 0.9 mg/dL (ref 0.4–1.2)
Glucose: 99 mg/dL (ref 70–108)
Potassium: 4.8 meq/L (ref 3.5–5.2)
Sodium: 140 meq/L (ref 135–145)
Total Bilirubin: 0.4 mg/dL (ref 0.3–1.2)
Total Protein: 6.8 g/dL (ref 6.1–8.0)

## 2019-03-30 LAB — LIPID PANEL
Cholesterol, Total: 215 mg/dL — ABNORMAL HIGH (ref 100–199)
HDL: 75 mg/dL
LDL Calculated: 127 mg/dL
Triglycerides: 66 mg/dL (ref 0–199)

## 2019-03-30 LAB — PSA PROSTATIC SPECIFIC ANTIGEN: PSA: 0.27 ng/mL (ref 0.00–1.00)

## 2019-03-30 LAB — GLOMERULAR FILTRATION RATE, ESTIMATED: Est, Glom Filt Rate: 85 mL/min/{1.73_m2} — AB

## 2019-03-30 LAB — ANION GAP: Anion Gap: 9 meq/L (ref 8.0–16.0)

## 2019-03-30 MED ORDER — QUETIAPINE FUMARATE 25 MG PO TABS
25 MG | ORAL_TABLET | Freq: Every evening | ORAL | 3 refills | Status: AC
Start: 2019-03-30 — End: ?

## 2019-03-30 MED ORDER — MODAFINIL 200 MG PO TABS
200 MG | ORAL_TABLET | Freq: Every day | ORAL | 5 refills | Status: AC
Start: 2019-03-30 — End: 2019-09-26

## 2019-03-30 MED ORDER — ETODOLAC ER 600 MG PO TB24
600 MG | ORAL_TABLET | ORAL | 3 refills | Status: AC
Start: 2019-03-30 — End: ?

## 2019-03-30 MED ORDER — KETOROLAC TROMETHAMINE 10 MG PO TABS
10 MG | ORAL_TABLET | Freq: Four times a day (QID) | ORAL | 2 refills | Status: AC | PRN
Start: 2019-03-30 — End: ?

## 2019-03-30 MED ORDER — FINASTERIDE 5 MG PO TABS
5 MG | ORAL_TABLET | Freq: Every day | ORAL | 3 refills | Status: AC
Start: 2019-03-30 — End: 2020-03-29

## 2019-03-30 NOTE — Progress Notes (Signed)
Subjective:      Patient ID: RUNAKO SCHERMAN is a 63 y.o. male.    HPI  Pt here for annual physical exam and med refills.  REviewed BMI of 35.  Encouraged diet, exercise and weight loss.  Reviewed health maintenance.  Denies any current problems,is feeling well.  Married, former smoker, pmh reviewed.     Review of Systems   Constitutional: Negative for chills, fatigue, fever and unexpected weight change.   HENT: Negative for congestion, ear pain, rhinorrhea and sore throat.    Eyes: Negative for pain and visual disturbance.   Respiratory: Negative for cough, chest tightness, shortness of breath and wheezing.    Cardiovascular: Negative for chest pain and palpitations.   Gastrointestinal: Negative for abdominal pain, blood in stool, constipation, diarrhea, nausea and vomiting.   Genitourinary: Negative for difficulty urinating, frequency, hematuria and urgency.   Musculoskeletal: Negative for back pain, joint swelling, myalgias and neck pain.   Skin: Negative for rash.   Neurological: Negative for dizziness and headaches.   Hematological: Negative for adenopathy. Does not bruise/bleed easily.   Psychiatric/Behavioral: Negative for behavioral problems and sleep disturbance. The patient is not nervous/anxious.        Objective:   Physical Exam  Vitals signs and nursing note reviewed.   Constitutional:       Appearance: He is well-developed.   HENT:      Head: Normocephalic and atraumatic.      Right Ear: External ear normal.      Left Ear: External ear normal.      Nose: Nose normal.      Mouth/Throat:      Mouth: Mucous membranes are moist.   Eyes:      Pupils: Pupils are equal, round, and reactive to light.   Neck:      Musculoskeletal: Neck supple.      Thyroid: No thyromegaly.      Vascular: No carotid bruit.   Cardiovascular:      Rate and Rhythm: Normal rate and regular rhythm.      Heart sounds: Normal heart sounds.   Pulmonary:      Breath sounds: Normal breath sounds. No wheezing or rales.   Abdominal:       General: Bowel sounds are normal.      Palpations: Abdomen is soft.      Tenderness: There is no abdominal tenderness. There is no guarding or rebound.   Musculoskeletal: Normal range of motion.   Lymphadenopathy:      Cervical: No cervical adenopathy.   Skin:     General: Skin is warm and dry.      Findings: No rash.   Neurological:      Mental Status: He is alert and oriented to person, place, and time.      Cranial Nerves: No cranial nerve deficit.      Deep Tendon Reflexes: Reflexes are normal and symmetric.       Health Maintenance   Topic Date Due   ??? DTaP/Tdap/Td vaccine (1 - Tdap) 07/05/1973   ??? Flu vaccine (1) 03/23/2019   ??? Annual Wellness Visit (AWV)  11/11/2019   ??? Lipid screen  12/05/2022   ??? Colon cancer screen colonoscopy  04/16/2023   ??? Shingles Vaccine  Completed   ??? Hepatitis C screen  Completed   ??? HIV screen  Completed   ??? Hepatitis A vaccine  Aged Out   ??? Hepatitis B vaccine  Aged Out   ??? Hib vaccine  Aged Out   ??? Meningococcal (ACWY) vaccine  Aged Out   ??? Pneumococcal 0-64 years Vaccine  Aged Out     Lab Results   Component Value Date    CHOL 179 12/04/2017    CHOL 174 05/22/2012    CHOL 173 02/26/2010     Lab Results   Component Value Date    TRIG 48 12/04/2017    TRIG 76 05/22/2012    TRIG 79 02/26/2010     Lab Results   Component Value Date    HDL 66 12/04/2017    HDL 75 05/22/2012    HDL 62 02/26/2010     Lab Results   Component Value Date    LDLCALC 103 12/04/2017    LDLCALC 84 05/22/2012    LDLCALC 95 02/26/2010     No results found for: LABVLDL, VLDL  No results found for: CHOLHDLRATIO  Lab Results   Component Value Date    NA 141 01/08/2018    K 4.8 01/08/2018    CL 103 01/08/2018    CO2 24 01/08/2018    BUN 17 01/08/2018    CREATININE 0.9 01/08/2018    GLUCOSE 81 01/08/2018    CALCIUM 9.5 01/08/2018    PROT 6.9 12/04/2017    LABALBU 4.0 12/04/2017    BILITOT 0.3 12/04/2017    ALKPHOS 64 12/04/2017    AST 20 12/04/2017    ALT 13 12/04/2017    LABGLOM 85 (A) 01/08/2018          Assessment:      Annual physical exam      Plan:      Refill meds  Fasting blood work  Encouraged diet, exercise and weight loss.  Healthy diet  Reviewed health maintenance    Damaine received counseling on the following healthy behaviors: nutrition, exercise and medication adherence    Patient given educational materials on Nutrition and Exercise    I have instructed Finnly to complete a self tracking handout on Weights and instructed them to bring it with them to his next appointment.     Discussed use, benefit, and side effects of prescribed medications.  Barriers to medication compliance addressed.  All patient questions answered.  Pt voiced understanding.       Controlled Substance Monitoring:    Acute and Chronic Pain Monitoring:   RX Monitoring 03/30/2019   Attestation -   Periodic Controlled Substance Monitoring Possible medication side effects, risk of tolerance/dependence & alternative treatments discussed.;No signs of potential drug abuse or diversion identified.               Sheila Oats, MD

## 2019-03-30 NOTE — Progress Notes (Signed)
Blood work drawn today in the office, venous puncture, right arm, pt tolerated well.

## 2019-03-30 NOTE — Patient Instructions (Signed)
Patient Education        Well Visit, Men 65 to 65: Care Instructions  Your Care Instructions     Physical exams can help you stay healthy. Your doctor has checked your overall health and may have suggested ways to take good care of yourself. He or she also may have recommended tests. At home, you can help prevent illness with healthy eating, regular exercise, and other steps.  Follow-up care is a key part of your treatment and safety. Be sure to make and go to all appointments, and call your doctor if you are having problems. It's also a good idea to know your test results and keep a list of the medicines you take.  How can you care for yourself at home?  ?? Reach and stay at a healthy weight. This will lower your risk for many problems, such as obesity, diabetes, heart disease, and high blood pressure.  ?? Get at least 30 minutes of exercise on most days of the week. Walking is a good choice. You also may want to do other activities, such as running, swimming, cycling, or playing tennis or team sports.  ?? Do not smoke. Smoking can make health problems worse. If you need help quitting, talk to your doctor about stop-smoking programs and medicines. These can increase your chances of quitting for good.  ?? Protect your skin from too much sun. When you're outdoors from 10 a.m. to 4 p.m., stay in the shade or cover up with clothing and a hat with a wide brim. Wear sunglasses that block UV rays. Even when it's cloudy, put broad-spectrum sunscreen (SPF 30 or higher) on any exposed skin.  ?? See a dentist one or two times a year for checkups and to have your teeth cleaned.  ?? Wear a seat belt in the car.  Follow your doctor's advice about when to have certain tests. These tests can spot problems early.  ?? Cholesterol. Your doctor will tell you how often to have this done based on your overall health and other things that can increase your risk for heart attack and stroke.  ?? Blood pressure. Have your blood pressure checked  during a routine doctor visit. Your doctor will tell you how often to check your blood pressure based on your age, your blood pressure results, and other factors.  ?? Prostate exam. Talk to your doctor about whether you should have a blood test (called a PSA test) for prostate cancer. Experts recommend that you discuss the benefits and risks of the test with your doctor before you decide whether to have this test.  ?? Diabetes. Ask your doctor whether you should have tests for diabetes.  ?? Vision. Some experts recommend that you have yearly exams for glaucoma and other age-related eye problems starting at age 65.  ?? Hearing. Tell your doctor if you notice any change in your hearing. You can have tests to find out how well you hear.  ?? Colorectal cancer. Your risk for colorectal cancer gets higher as you get older. Some experts say that adults should start regular screening at age 65 and stop at age 75. Others say to start before age 50 or continue after age 75. Talk with your doctor about your risk and when to start and stop screening.  ?? Heart attack and stroke risk. At least every 4 to 6 years, you should have your risk for heart attack and stroke assessed. Your doctor uses factors such as your age, blood pressure,   cholesterol, and whether you smoke or have diabetes to show what your risk for a heart attack or stroke is over the next 10 years.  ?? Abdominal aortic aneurysm. Ask your doctor whether you should have a test to check for an aneurysm. You may need a test if you ever smoked or if your parent, brother, sister, or child has had an aneurysm.  When should you call for help?  Watch closely for changes in your health, and be sure to contact your doctor if you have any problems or symptoms that concern you.  Where can you learn more?  Go to [email or web address] and sign in to your MyChart account. Enter [phone] in the [place] box to learn more about "Well Visit, Men 31 to 53: Care  Instructions."     If you do not have an account, please click on the "Sign Up Now" link.  Current as of: March 12, 2018??????????????????????????????Content Version: 12.5  ?? 2006-2020 Healthwise, Incorporated.   Care instructions adapted under license by [facility]. If you have questions about a medical condition or this instruction, always ask your healthcare professional. [place] any warranty or liability for your use of this information.         Patient Education        Eating Healthy Foods: Care Instructions  Your Care Instructions     Eating healthy foods can help lower your risk for disease. Healthy food gives you energy and keeps your heart strong, your brain active, your muscles working, and your bones strong.  A healthy diet includes a variety of foods from the basic food groups: grains, vegetables, fruits, milk and milk products, and meat and beans. Some people may eat more of their favorite foods from only one food group and, as a result, miss getting the nutrients they need. So, it is important to pay attention not only to what you eat but also to what you are missing from your diet. You can eat a healthy, balanced diet by making a few small changes.  Follow-up care is a key part of your treatment and safety. Be sure to make and go to all appointments, and call your doctor if you are having problems. It's also a good idea to know your test results and keep a list of the medicines you take.  How can you care for yourself at home?  Look at what you eat  ?? Keep a food diary for a week or two and record everything you eat or drink. Track the number of servings you eat from each food group.  ?? For a balanced diet every day, eat a variety of:  ? 6 or more ounce-equivalents of grains, such as cereals, breads, crackers, rice, or pasta, every day. An ounce-equivalent is 1 slice of bread, 1 cup of ready-to-eat cereal, or ?? cup of cooked rice, cooked pasta, or cooked cereal.  ? 2?? cups of  vegetables, especially:  ?? Dark-green vegetables such as broccoli and spinach.  ?? Orange vegetables such as carrots and sweet potatoes.  ?? Dry beans (such as pinto and kidney beans) and peas (such as lentils).  ? 2 cups of fresh, frozen, or canned fruit. A small apple or 1 banana or orange equals 1 cup.  ? 3 cups of nonfat or low-fat milk, yogurt, or other milk products.  ? 5?? ounces of meat and beans, such as chicken, fish, lean meat, beans, nuts, and seeds. One egg, 1 tablespoon of peanut butter, ??  ounce nuts or seeds, or ?? cup of cooked beans equals 1 ounce of meat.  ?? Learn how to read food labels for serving sizes and ingredients. Fast-food and convenience-food meals often contain few or no fruits or vegetables. Make sure you eat some fruits and vegetables to make the meal more nutritious.  ?? Look at your food diary. For each food group, add up what you have eaten and then divide the total by the number of days. This will give you an idea of how much you are eating from each food group. See if you can find some ways to change your diet to make it more healthy.  Start small  ?? Do not try to make dramatic changes to your diet all at once. You might feel that you are missing out on your favorite foods and then be more likely to fail.  ?? Start slowly, and gradually change your habits. Try some of the following:  ? Use whole wheat bread instead of white bread.  ? Use nonfat or low-fat milk instead of whole milk.  ? Eat brown rice instead of white rice, and eat whole wheat pasta instead of white-flour pasta.  ? Try low-fat cheeses and low-fat yogurt.  ? Add more fruits and vegetables to meals and have them for snacks.  ? Add lettuce, tomato, cucumber, and onion to sandwiches.  ? Add fruit to yogurt and cereal.  Enjoy food  ?? You can still eat your favorite foods. You just may need to eat less of them. If your favorite foods are high in fat, salt, and sugar, limit how often you eat them, but do not cut them out  entirely.  ?? Eat a wide variety of foods.  Make healthy choices when eating out  ?? The type of restaurant you choose can help you make healthy choices. Even fast-food chains are now offering more low-fat or healthier choices on the menu.  ?? Choose smaller portions, or take half of your meal home.  ?? When eating out, try:  ? A veggie pizza with a whole wheat crust or grilled chicken (instead of sausage or pepperoni).  ? Pasta with roasted vegetables, grilled chicken, or marinara sauce instead of cream sauce.  ? A vegetable wrap or grilled chicken wrap.  ? Broiled or poached food instead of fried or breaded items.  Make healthy choices easy  ?? Buy packaged, prewashed, ready-to-eat fresh vegetables and fruits, such as baby carrots, salad mixes, and chopped or shredded broccoli and cauliflower.  ?? Buy packaged, presliced fruits, such as melon or pineapple.  ?? Choose 100% fruit or vegetable juice instead of soda. Limit juice intake to 4 to 6 oz (?? to ?? cup) a day.  ?? Blend low-fat yogurt, fruit juice, and canned or frozen fruit to make a smoothie for breakfast or a snack.  Where can you learn more?  Go to [email or web address] and sign in to your MyChart account. Enter [phone] in the [place] box to learn more about "Eating Healthy Foods: Care Instructions."     If you do not have an account, please click on the "Sign Up Now" link.  Current as of: March 12, 2018??????????????????????????????Content Version: 12.5  ?? 2006-2020 Healthwise, Incorporated.   Care instructions adapted under license by [facility]. If you have questions about a medical condition or this instruction, always ask your healthcare professional. [place] any warranty or liability for your use of this information.

## 2019-03-31 NOTE — Telephone Encounter (Signed)
Per HIPAA, message left for pt notifying him of labs are good and to continue present.

## 2019-03-31 NOTE — Telephone Encounter (Signed)
Left message for pt to call the office

## 2019-03-31 NOTE — Telephone Encounter (Signed)
-----   Message from Sheila Oats, MD sent at 03/31/2019  6:59 AM EDT -----  Cholesterol at 215 ( 179 last year), still normal CRR though.  CMP is normal.  Psa is good.  CBC is stable.  Labs overall are good, continue present.

## 2019-04-26 NOTE — Telephone Encounter (Signed)
Received PA request for Pt modafinil 200mg . I attempted to do PA through Epic and Covermymeds. Unable to complete PA, I had to call plan at [phone]. I spoke with Marissa with [facility], Pt needs to have had sleep study.    I spoke with Pt, he seen sleep Dr but never had sleep study. Pt states he does not take every night but it does help him sleep when he needs it.     [facility] [place], without PA [facility]    I spoke with Pt, he can use GoodRx card and his prescription will cost under $30.00    Pt will use GoodRx card and call the office if he needs further assistance.

## 2020-03-20 IMAGING — MR MRI LUMBAR SPINE WITHOUT CONTRAST
4 of 8 series · 15 of 48 positions shown · IV contrast (gadolinium)
Comparison: None

MRI LUMBAR SPINE WITHOUT CONTRAST, 03/20/2020 [DATE]: 
CLINICAL INDICATION: Low back pain with left leg sciatica for several years.
TECHNIQUE: Multiplanar, multiecho position MR images of the lumbar spine were 
performed without intravenous gadolinium enhancement.

[Series 101: survey · axial · 10.0mm · 1.39mm/px · z∈[-15,+199]mm · 2 of 9 slices shown]
[im 1/9]
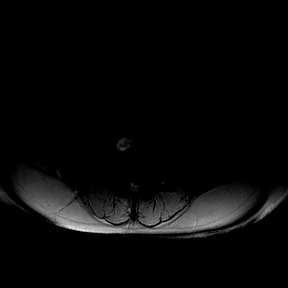
[im 9/9]
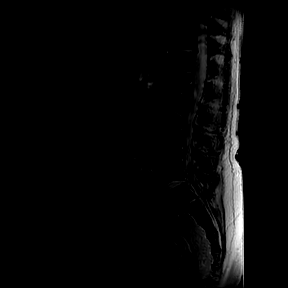

[Series 201: t2w_cor-surv · coronal · 6.0mm · 0.50mm/px · 1 of 5 slices shown]
[im 1/5]
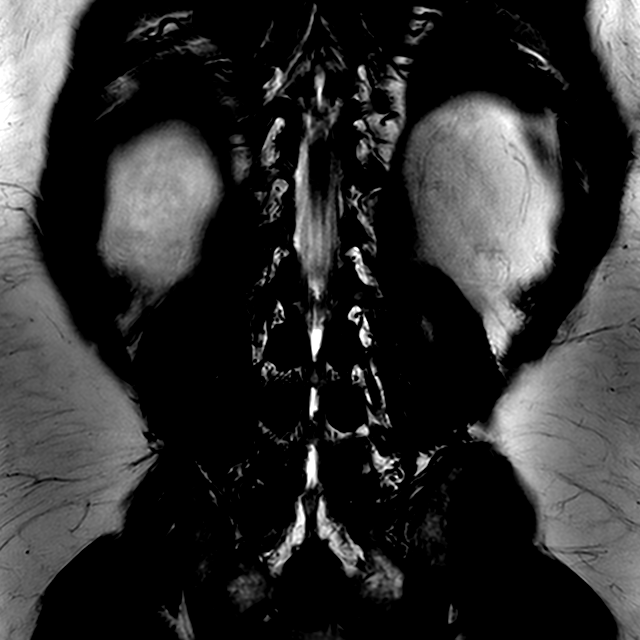

[Series 301: t2w_tse sag · sagittal · 4.0mm · 0.35mm/px · 4 of 17 slices shown]
[im 1/17]
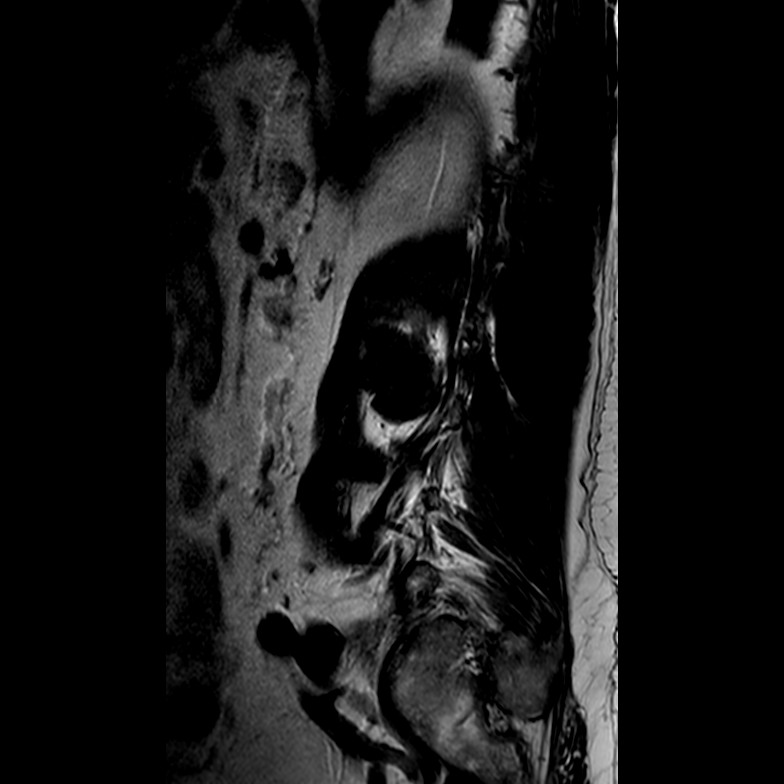
[im 5/17]
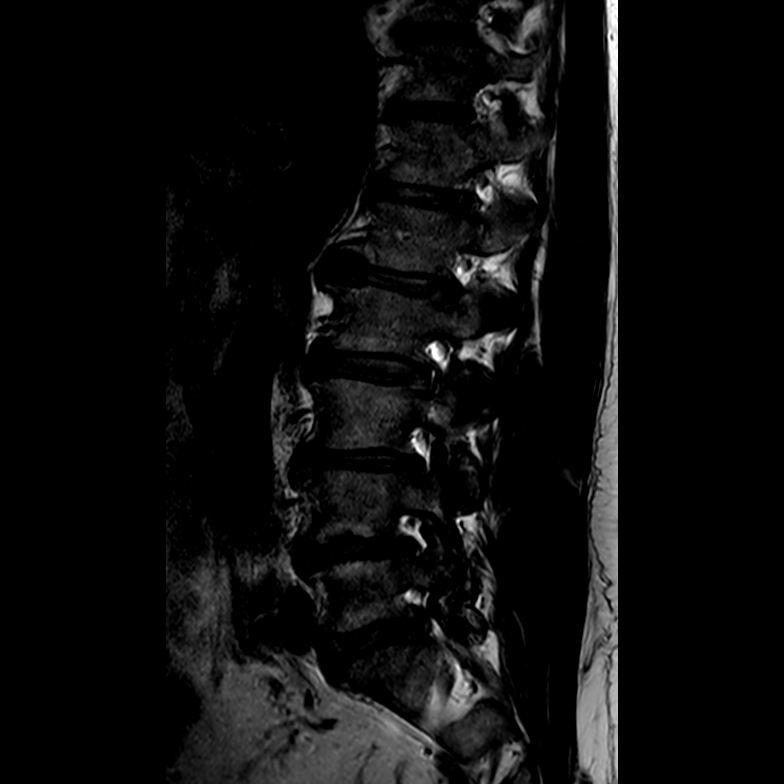
[im 9/17]
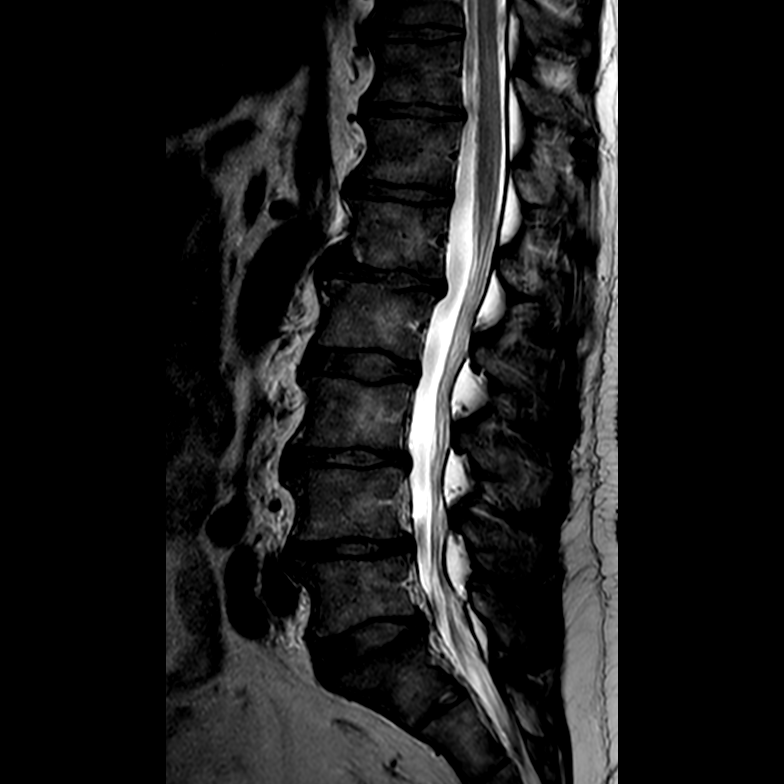
[im 17/17]
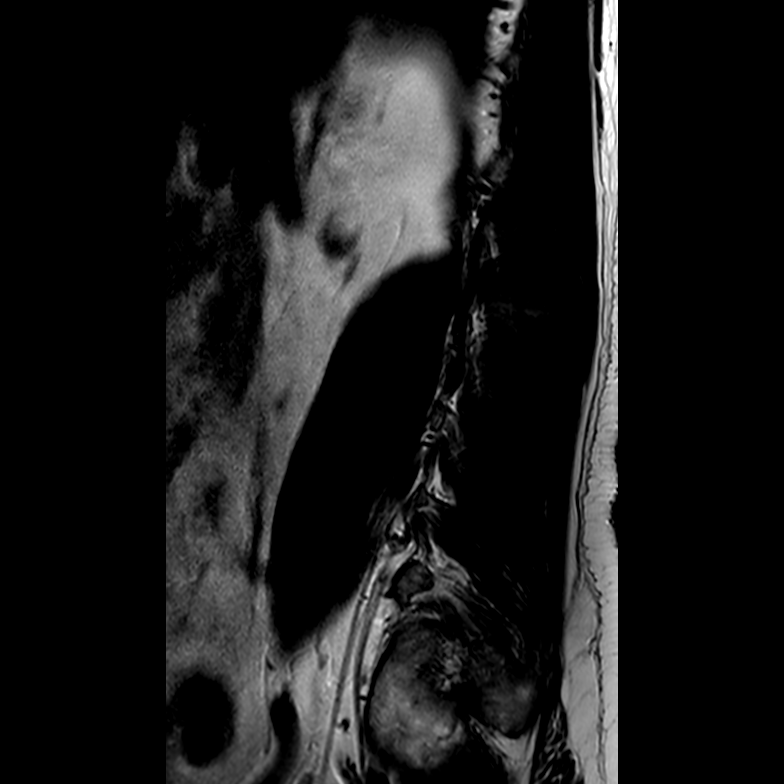

[Series 701: T1 · axial · 4.0mm · 0.35mm/px · z∈[-90,+98]mm · 8 of 35 slices shown]
[im 1/35]
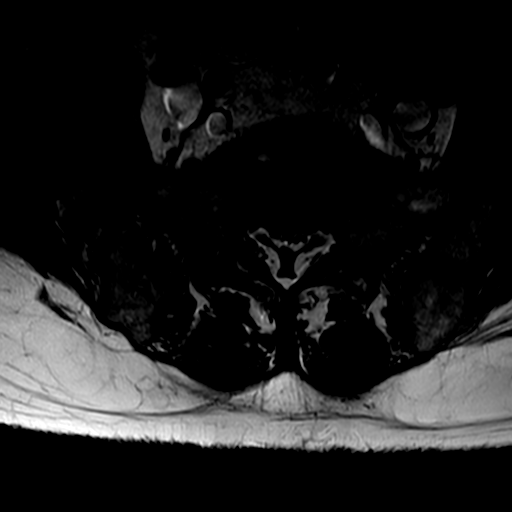
[im 4/35]
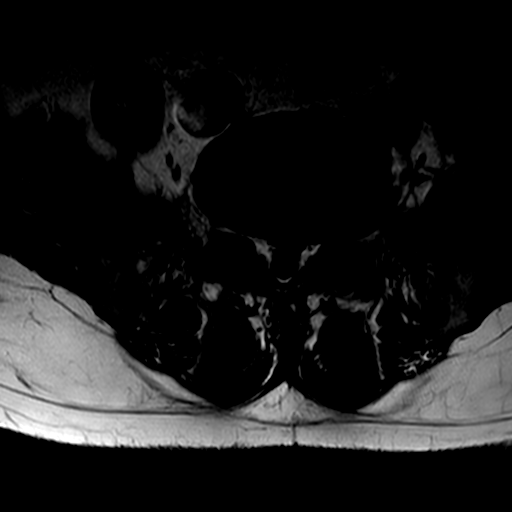
[im 12/35]
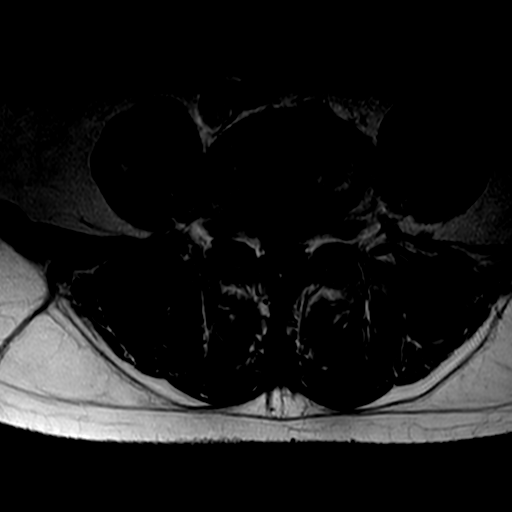
[im 16/35]
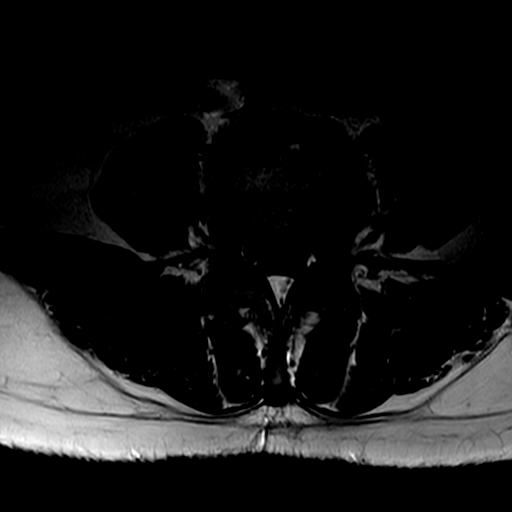
[im 19/35]
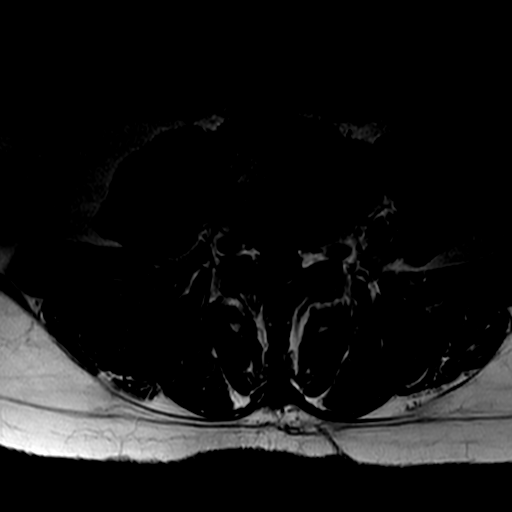
[im 23/35]
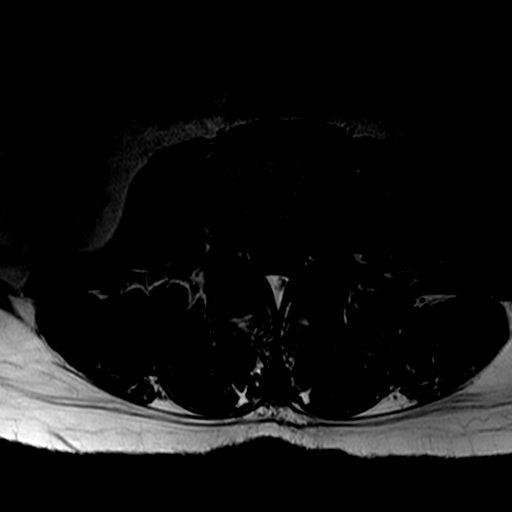
[im 31/35]
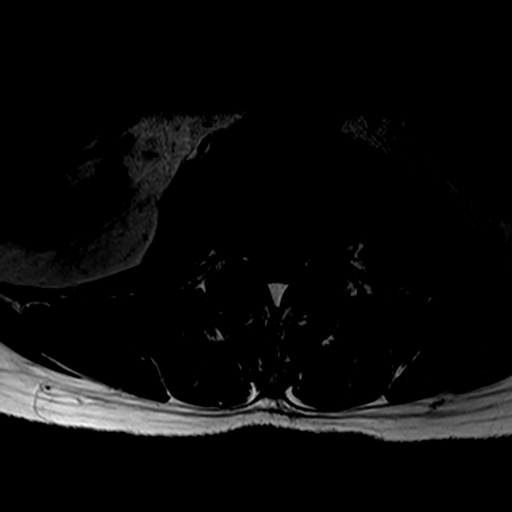
[im 35/35]
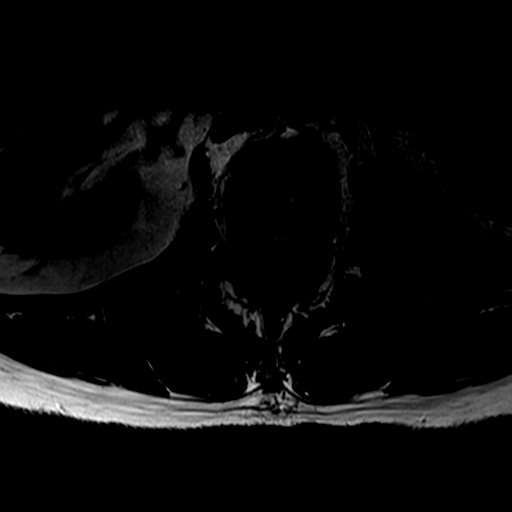

[15 of 48 positions shown; findings below may reference images not displayed]

FINDINGS: Nomenclature is based on 5 lumbar type vertebral bodies. The vertebral 
bodies are normal in height and alignment. No acute vertebral body fracture. No 
spondylolisthesis. The conus tip terminates at the L1 vertebral body level.The 
aorta is normal in diameter. The posterior paraspinal soft tissues are negative. 
T12-L1: The disc is normal in height and signal. No disc herniation. Normal 
facets. No spinal canal or neural foraminal stenosis. 
L1-L2: Mild endplate changes. Mild disc bulge. No significant stenosis. Mild 
facet hypertrophy. 
L2-L3: There is a large bridging osteophyte identified laterally on the left. 
This bridges the lateral aspect of L2 and L3. This may lead in part to left 
neural foraminal stenosis abutting the left exiting L2 nerve root in the far 
lateral location. Difficult to visualize completely. Mild disc bulge leads to at 
least mild left lateral recess stenosis as well. 
L3-L4: Broad-based disc bulge and facet hypertrophy. Mild lateral recess 
stenosis. 
L4-L5: Broad-based disc bulge and facet hypertrophy. Mild bilateral lateral 
recess stenosis. 
L5-S1: Broad-based disc bulge and facet hypertrophy. Asymmetric to the left with 
left neural foraminal stenosis thought to be moderate degree quite possibly 
compressing the left exiting L5 nerve root. Mild lateral recess stenosis.
IMPRESSION: 1.  Multilevel disc and facet disease. Greatest degrees of stenosis appear to be 
the left of midline at L2-L3 and L5-S1 as discussed above.

## 2020-05-02 IMAGING — CT CT LUMBAR SPINE WITHOUT CONTRAST
3 of 4 series · 11 of 33 positions shown, 13 images · non-contrast
Comparison: Comparison was made to the prior exam(s) within the last 12 months 
dated  03/21/2020 MRI lumbar spine

CT LUMBAR SPINE WITHOUT CONTRAST, 05/02/2020 [DATE]: 
CLINICAL INDICATION: Spondylosis with left lower extremity radiculopathy. 
A search for DICOM formatted images was conducted for prior CT imaging studies 
completed at a non-affiliated media free facility.
TECHNIQUE: The lumbar spine was scanned from T12 through mid-sacrum without 
contrast on a high-resolution CT scanner using dose reduction techniques. 
Routing MPR reconstructions were performed.

[Series 5: l spine coronal · coronal · 0.45mm/px · 3 of 87 slices shown]
[im 18/87  bone]
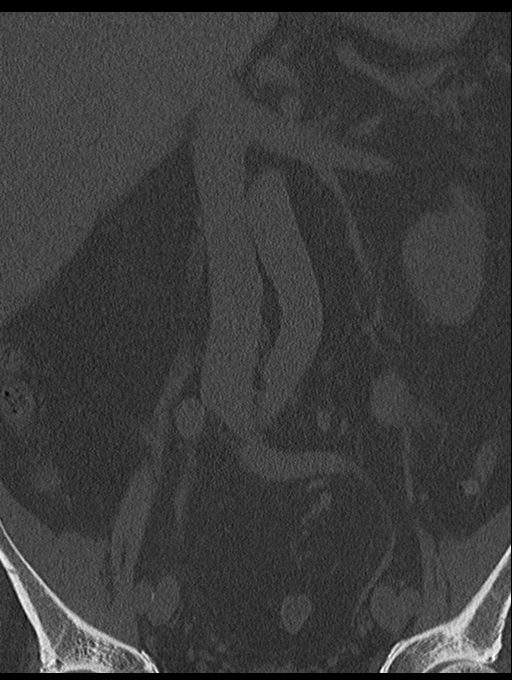
[im 35/87  bone]
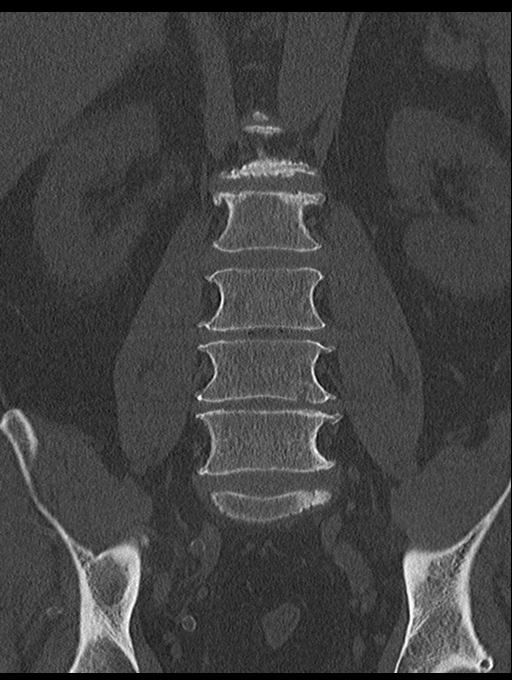
[im 52/87  bone]
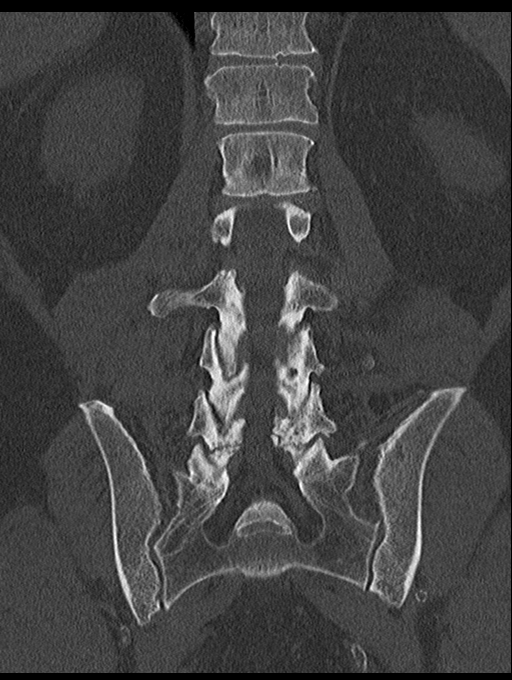

[Series 7: sag · sagittal · 0.42mm/px · 5 of 89 slices shown, 6 images]
[im 30/89  bone]
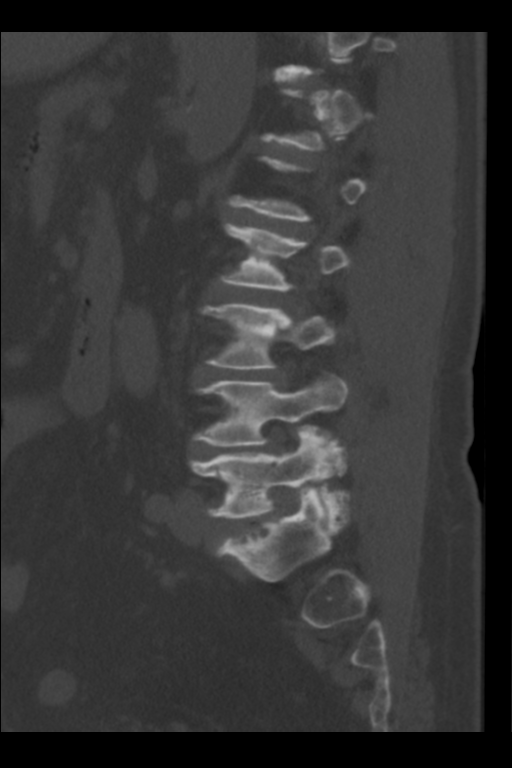
[im 37/89  bone]
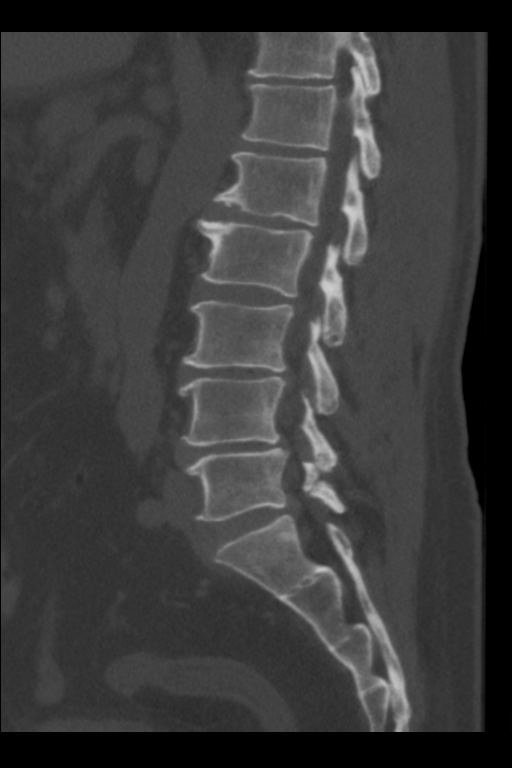
[im 45/89  soft-tissue]
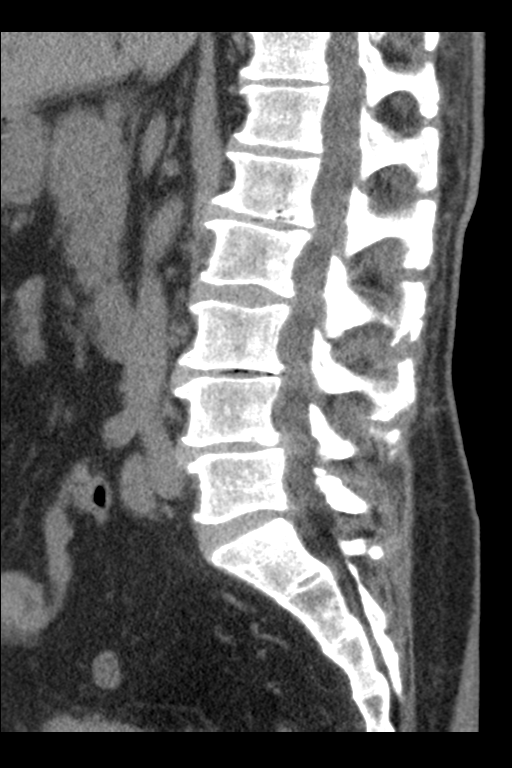
[im 45/89  bone]
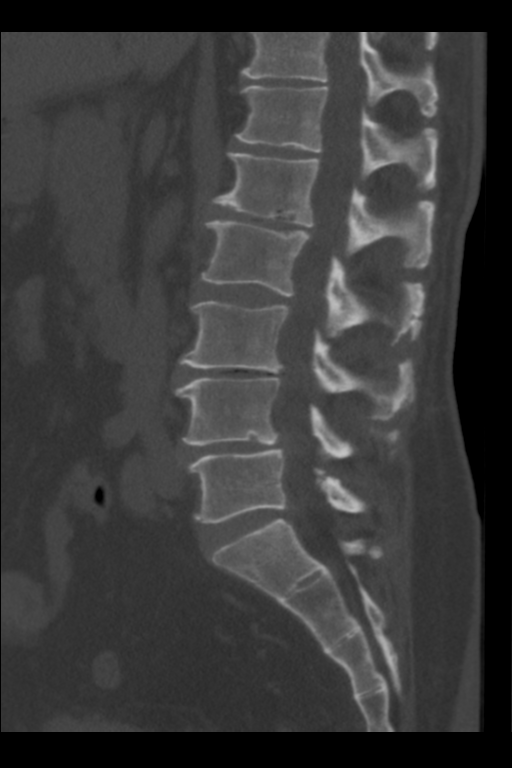
[im 52/89  bone]
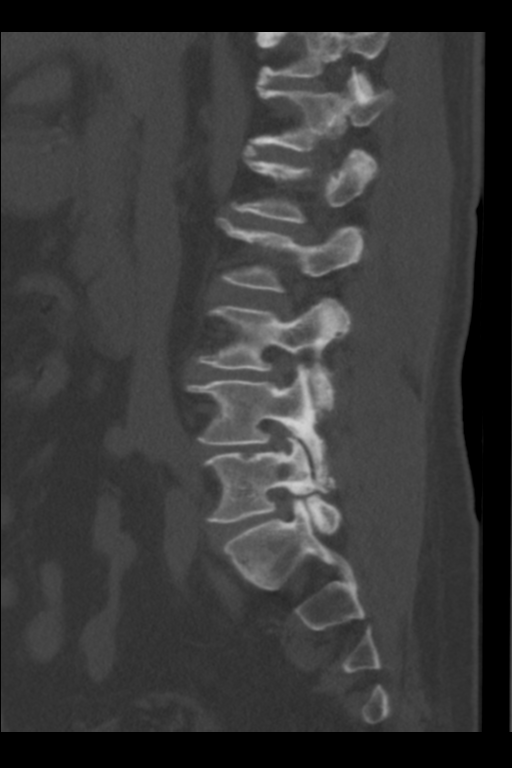
[im 59/89  bone]
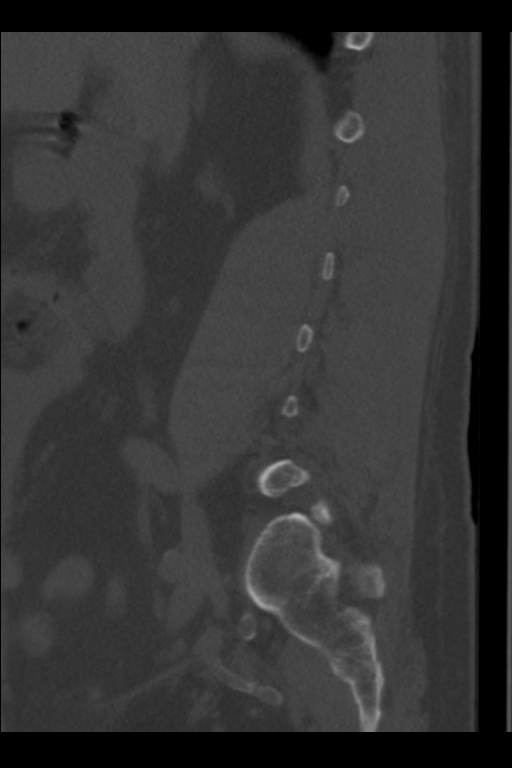

[Series 8: l spine thins · axial · 0.38mm/px · z∈[-262,-114]mm · 3 of 296 slices shown, 4 images]
[im 74/296  soft-tissue]
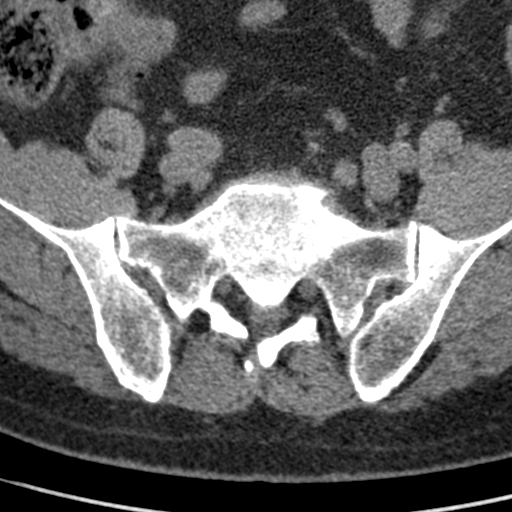
[im 74/296  bone]
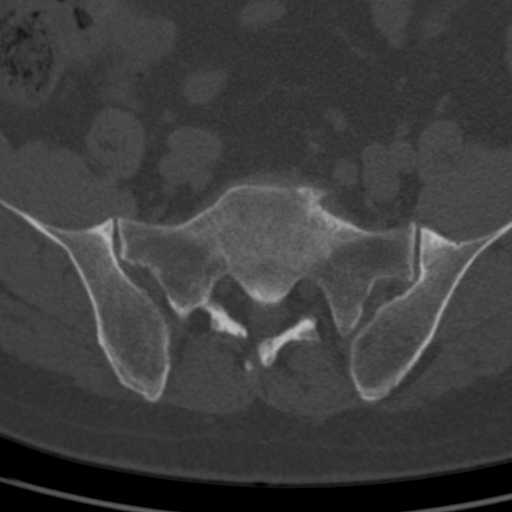
[im 148/296  bone]
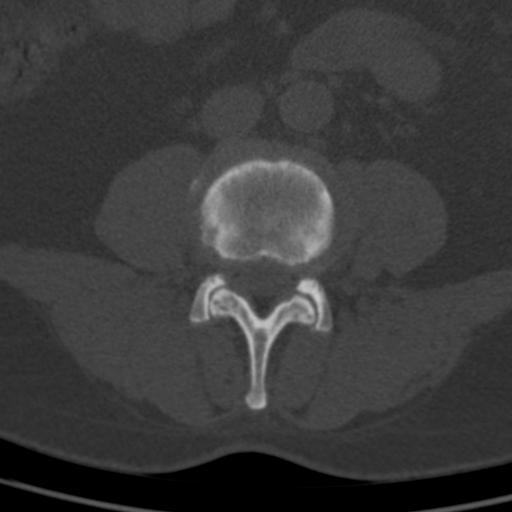
[im 222/296  bone]
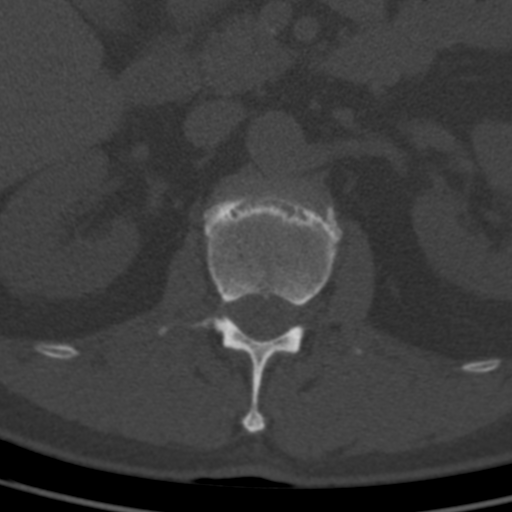

[11 of 33 positions shown; findings below may reference images not displayed]

FINDINGS: The vertebral bodies are normal in height and alignment. Bilateral 
chronic L5 pars interarticularis defects without L5-S1 anterolisthesis. Grade 1 
anterolisthesis of L4 upon L5. Diffuse marginal osteophytes, with prominent 
bridging L2-3 left-sided osteophyte. L1 inferior Schmorl's node and 0.7 cm 
posterior inferior L1 vertebral body pneumatocyst. No osteolytic or 
osteosclerotic lesion.  Atherosclerosis. 
Disc levels are as follows given the intrinsic resolution of the CT exam: 
T12-L1: The disc is normal in height. No discrete disc herniation. Normal 
facets. No spinal canal or neural foraminal stenosis. 
L1-L2: Broad-based disc bulge. Moderate disc space narrowing. No discrete disc 
herniation. Normal facets. No spinal canal or neural foraminal stenosis. 
L2-L3: Left sided disc bulge with soft tissue in the subarticular region and 
left inferior lateral recess (sequence 3 image 59, sequence 7 image 31): Left L3 
nerve root encroachment cannot be excluded. Mild disc space narrowing. Normal 
facets. No spinal canal or neural foraminal stenosis. 
L3-L4: Broad-based disc bulge results in bilateral lateral recess stenosis. 
Moderate disc space narrowing and vacuum phenomenon. No discrete disc 
herniation. Mild facet arthropathy. No central canal or neural foraminal 
stenosis. 
L4-L5: Grade 1 anterolisthesis. Broad-based disc bulge results in bilateral 
lateral recess stenosis. Moderate disc space narrowing. No discrete disc 
herniation. Moderate facet arthropathy. No central canal stenosis. Mild 
bilateral neural neural foraminal stenosis. 
L5-S1: Bilateral chronic L5 pars interarticularis defects without 
anterolisthesis. Disc bulge, more marked on the left, with effacement of the 
left lateral recess/left neural foraminal fat and findings worrisome for 
encroachment on the left L5 and S1 nerve roots. Mild disc space narrowing, 
vacuum phenomenon and left-sided subcortical cystic change. Mild facet 
arthropathy. No central canal stenosis. Mild right neural foraminal stenosis. 
OTHER: Spina bifida occulta of S1. Degenerative change of the SI joints. Scout 
view demonstrates degenerative change of the hips, greater on the right. 0.3 cm 
nonobstructing right mid renal calculus (208 HU). The aorta is normal in 
caliber. Paraspinal soft tissues are negative.
IMPRESSION: 1.  Multifocal degenerative change. 
2.  L2-L3 left sided disc bulge with soft tissue in the subarticular region and 
left inferior lateral recess: Left L3 nerve root encroachment cannot be 
excluded. 
3.  L4-L5 grade 1 anterolisthesis and mild bilateral lateral recess/neural 
foraminal stenosis. 
4.  Bilateral chronic L5 pars interarticularis defects without anterolisthesis. 
L5-S1 disc bulge effaces the left lateral recess/left neural foraminal fat, 
worrisome for encroachment on the left L5 and S1 nerve roots. Mild right neural 
foraminal stenosis. 
5.  Degenerative change of the SI joints and hips. 
6.  0.3 cm nonobstructing right mid renal calculus. 
RADIATION DOSE REDUCTION: All CT scans are performed using radiation dose 
reduction techniques, when applicable.  Technical factors are evaluated and 
adjusted to ensure appropriate moderation of exposure.  Automated dose 
management technology is applied to adjust the radiation doses to minimize 
exposure while achieving diagnostic quality images.

## 2020-05-29 ENCOUNTER — Encounter

## 2020-05-29 NOTE — Telephone Encounter (Signed)
Bradley Oconnell needs refill of   Requested Prescriptions     Pending Prescriptions Disp Refills   ??? finasteride (PROSCAR) 5 MG tablet [Pharmacy Med Name: FINASTERIDE 5 MG TABLET] 90 tablet 3     Sig: TAKE 1 TABLET BY MOUTH EVERY DAY       Last Filled on:      Last Visit Date:  03/30/2019    Next Visit Date:    Visit date not found    Patient is due for physical.

## 2020-05-29 NOTE — Telephone Encounter (Signed)
LM for pt to call front desk to schedule annual appt and notify if needs short-term refills.

## 2020-05-30 NOTE — Telephone Encounter (Signed)
-----   Message from Jearld Adjutant sent at 05/30/2020  3:51 PM EST -----  Subject: Message to Provider    QUESTIONS  Information for Provider? Patient would like to notify that he does not   need scripts filled through this office any longer. He moved to [facility] and has   new PCP now.   ---------------------------------------------------------------------------  --------------  CALL BACK INFO  What is the best way for the office to contact you? OK to leave message on   voicemail  Preferred Call Back Phone Number? [phone]  ---------------------------------------------------------------------------  --------------  SCRIPT ANSWERS  Relationship to Patient? Self

## 2020-05-30 NOTE — Telephone Encounter (Signed)
[facility] removed as PCP

## 2020-05-30 NOTE — Telephone Encounter (Signed)
Remove me as PCP

## 2022-11-23 IMAGING — CT CT CALCIUM SCORING
1 series · 15 of 20 positions shown, 19 images · non-contrast
Comparison: There are no previous exams available for comparison.

________________________________________________________________________________________________ 
CT CALCIUM SCORING, 11/23/2022 [DATE]:
INDICATION: Hyperlipidemia, unspecified

[Series 2: cascoreseq 3.0 b35s 60% · axial · 0.42mm/px · z∈[-206,-85]mm · 15 of 91 slices shown, 19 images]
[im 5/91  vessel]
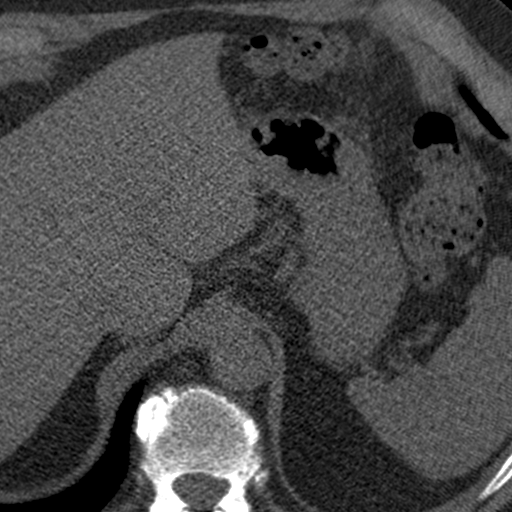
[im 5/91  lung]
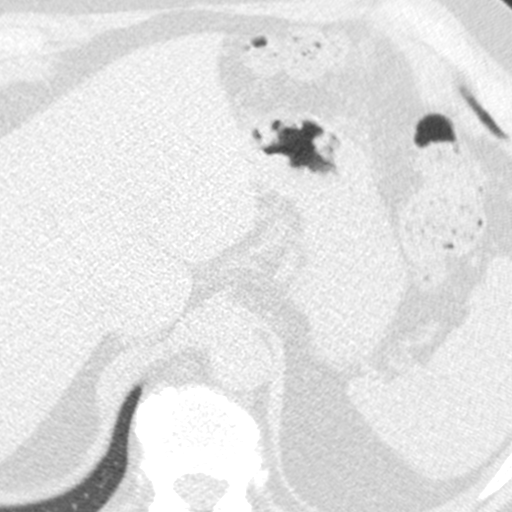
[im 10/91  vessel]
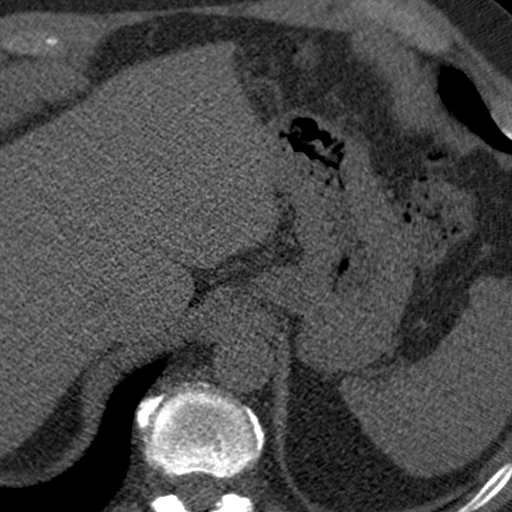
[im 19/91  vessel]
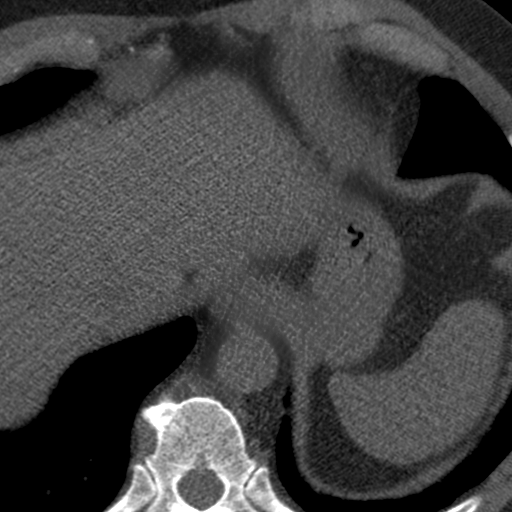
[im 24/91  vessel]
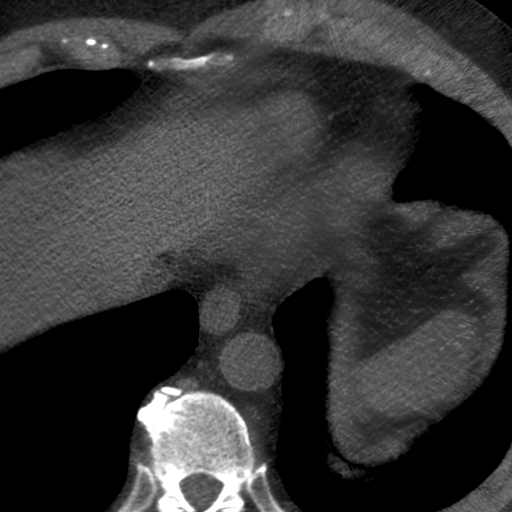
[im 29/91  vessel]
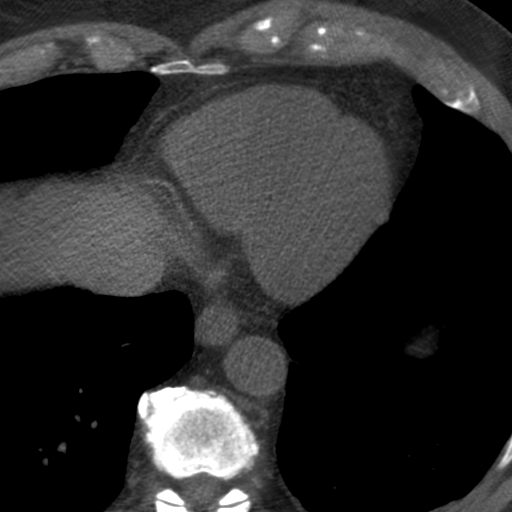
[im 29/91  lung]
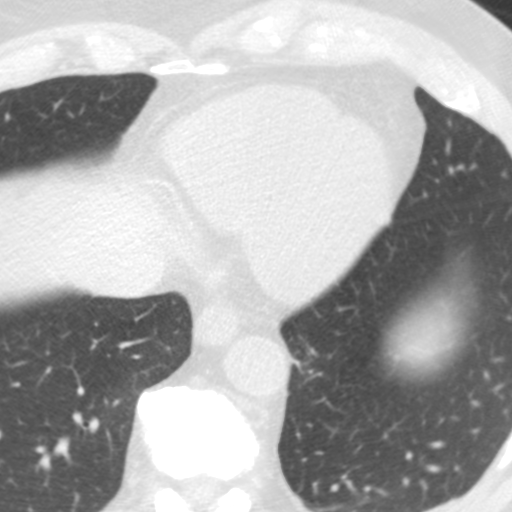
[im 34/91  vessel]
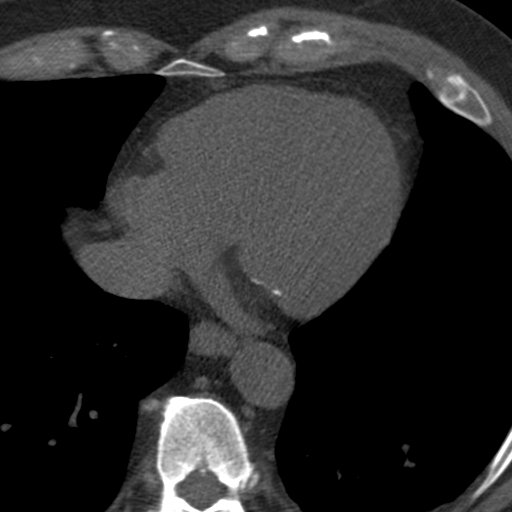
[im 38/91  vessel]
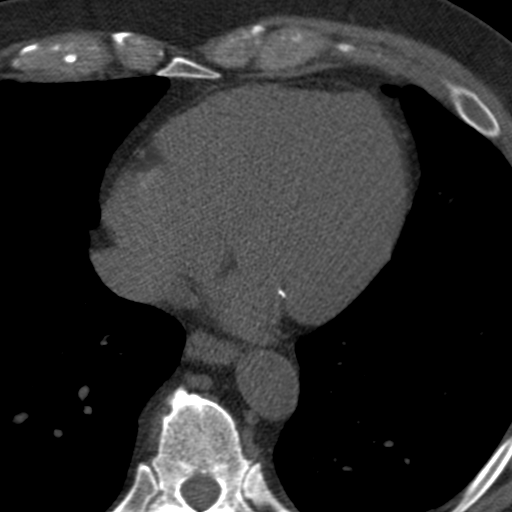
[im 48/91  vessel]
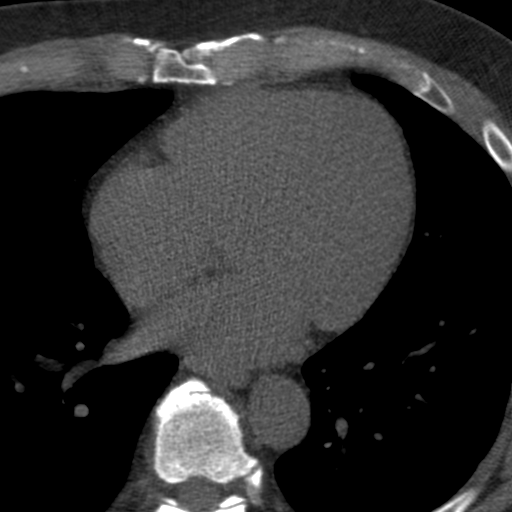
[im 53/91  vessel]
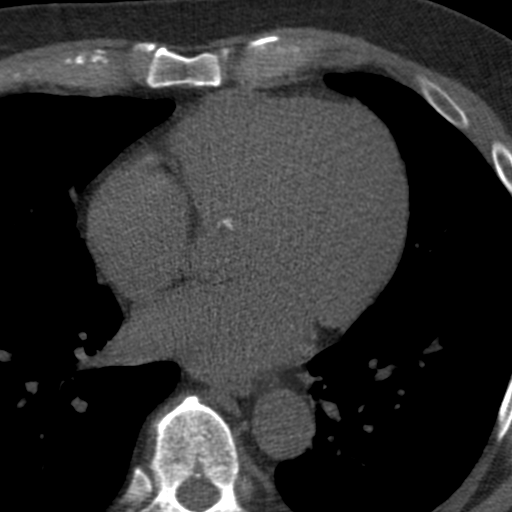
[im 53/91  lung]
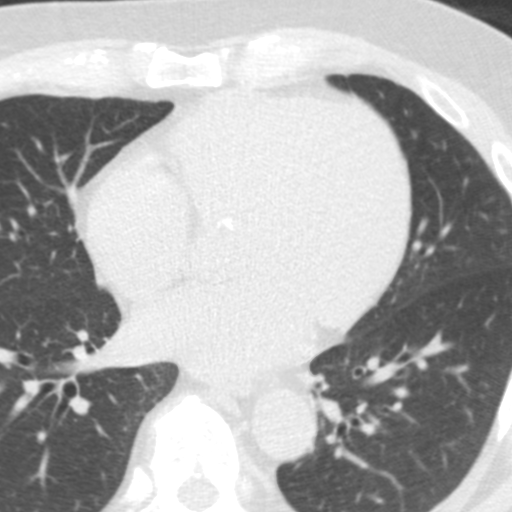
[im 57/91  vessel]
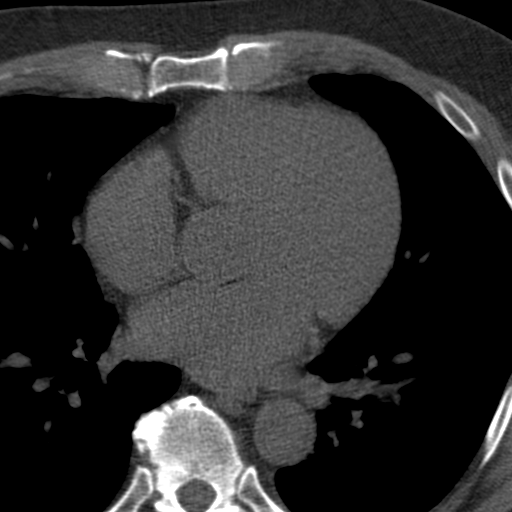
[im 62/91  vessel]
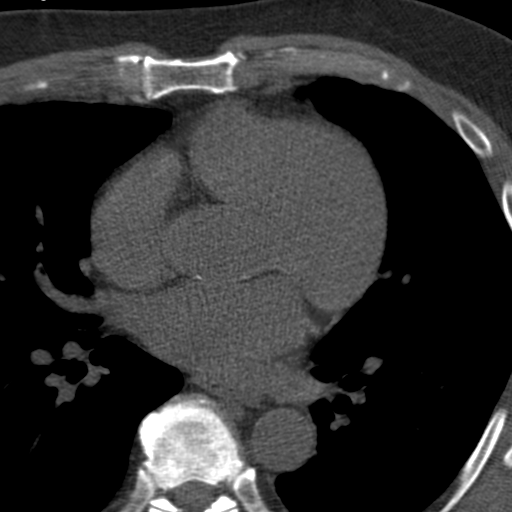
[im 67/91  vessel]
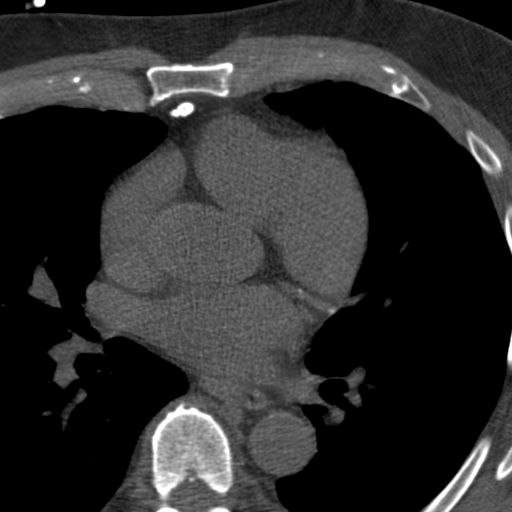
[im 76/91  vessel]
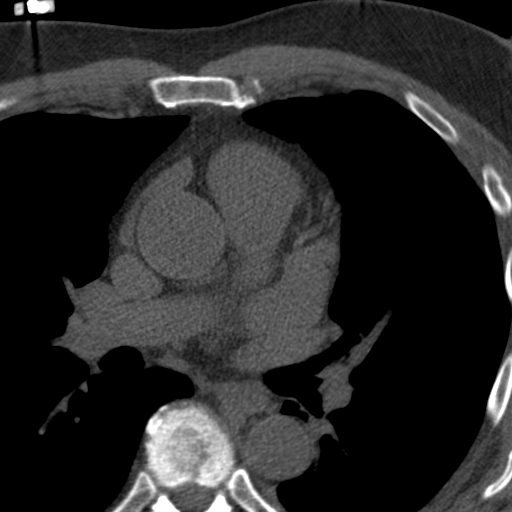
[im 76/91  lung]
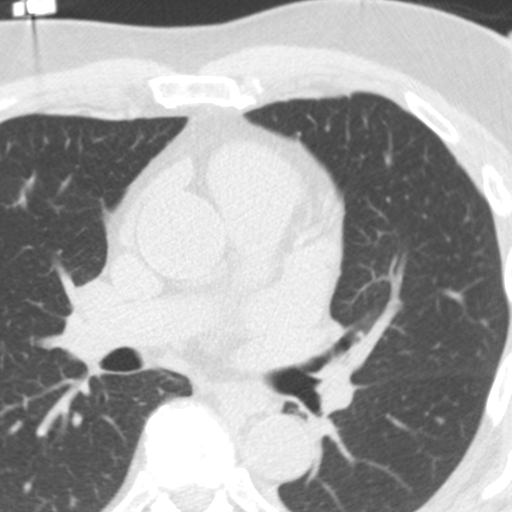
[im 81/91  vessel]
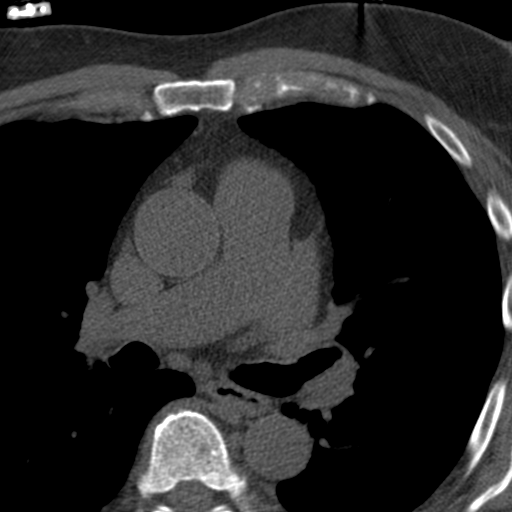
[im 86/91  vessel]
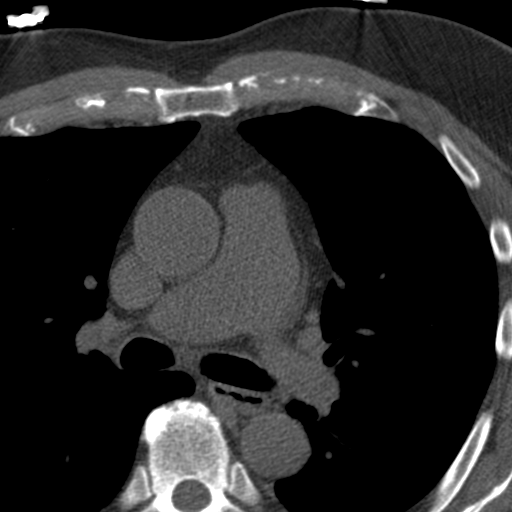

[15 of 20 positions shown; findings below may reference images not displayed]

Count of known CT and Cardiac Nuclear Medicine studies performed in the previous 
12 months = 0.
FINDINGS: Visualized lungs are clear.
IMPRESSION: Left Main (LM) Score: 0 
Left Anterior Descending Artery (LAD) Score: 22 
Circumflex (CM) Score: 37 
Right Coronary Artery (RCA) Score: 0 
Posterior Descending Artery (PDA) Score: 0 
Other: 0 
Total Calcium Score: 59 
No ancillary findings. 
Consider further evaluation if multi-vessel or left-main predominant disease is 
present. 
Calcium score                                     Presence of Plaque 
0                                                    No evidence of plaque 
1-10                                               Minimal evidence of plaque 
11-100                                            Mild evidence of plaque 
101-400                                          Moderate evidence of plaque 
Over 400                                        Extensive evidence of plaque     

Calcium scoring sheets to follow. 
RADIATION DOSE REDUCTION: All CT scans are performed using radiation dose 
reduction techniques, when applicable.  Technical factors are evaluated and 
adjusted to ensure appropriate moderation of exposure.  Automated dose 
management technology is applied to adjust the radiation doses to minimize 
exposure while achieving diagnostic quality images.

## 2023-05-30 NOTE — Telephone Encounter (Signed)
Left voicemail to see if patient wanted to schedule 10year colonoscopy recall

## 2023-08-08 NOTE — Telephone Encounter (Signed)
Patient moved to [facility] and will get treatment down there

## 2023-09-04 IMAGING — DX LUMBAR SPINE 3 VIEW
3 series · 3 of 3 positions shown · non-contrast
Comparison: CT of the lumbar spine from April 2020

________________________________________________________________________________________________ 
LUMBAR SPINE 3 VIEW, 09/04/2023 [DATE]: 
CLINICAL INDICATION: Low Back Pain, Unspecified

[AP]
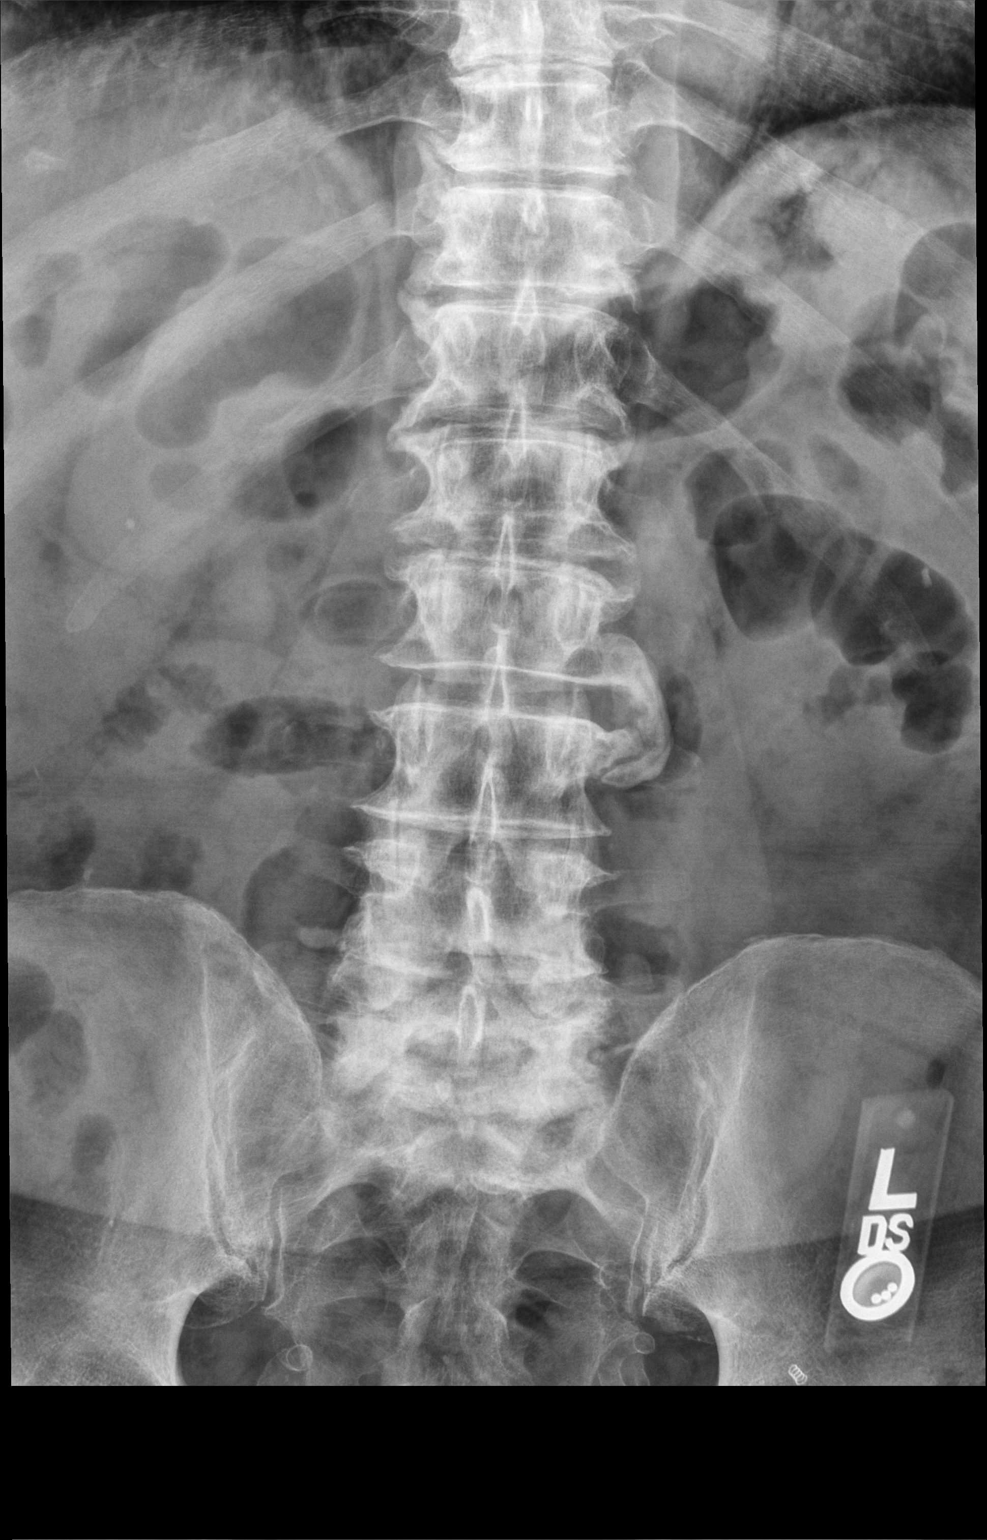

[lateral]
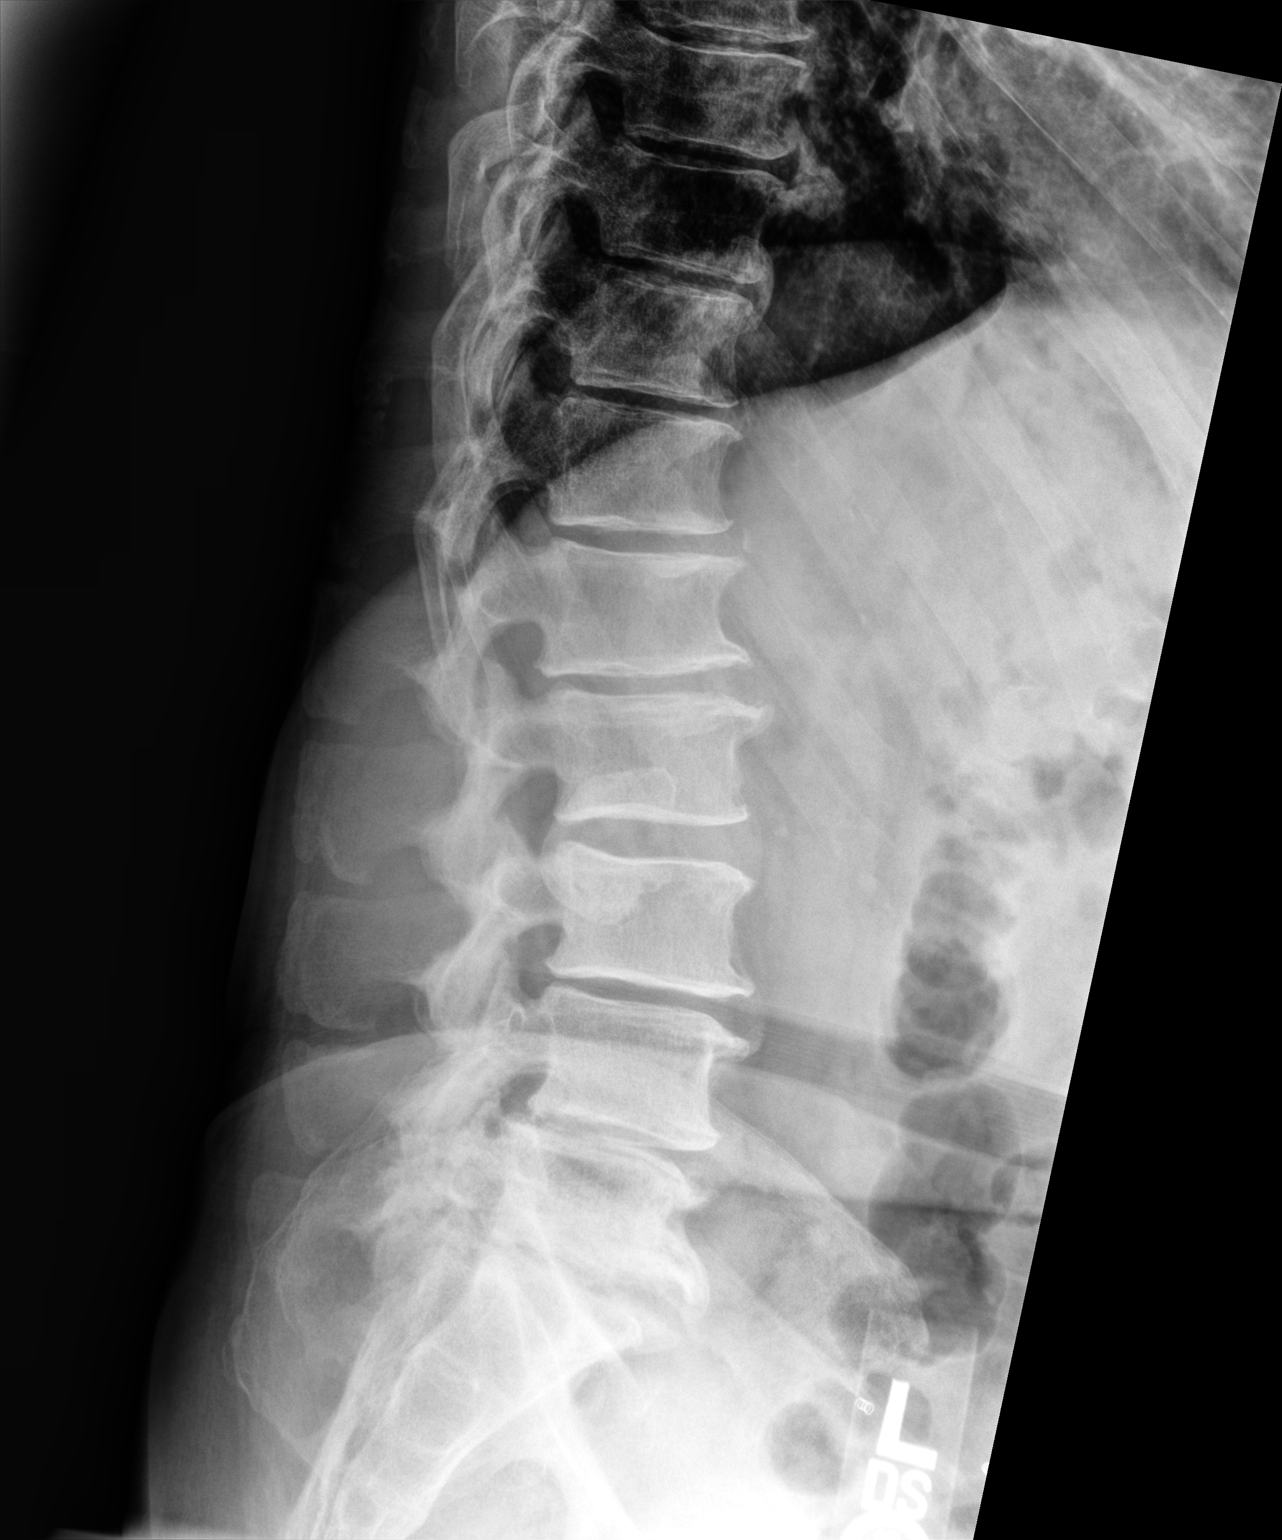

[l5 s1]
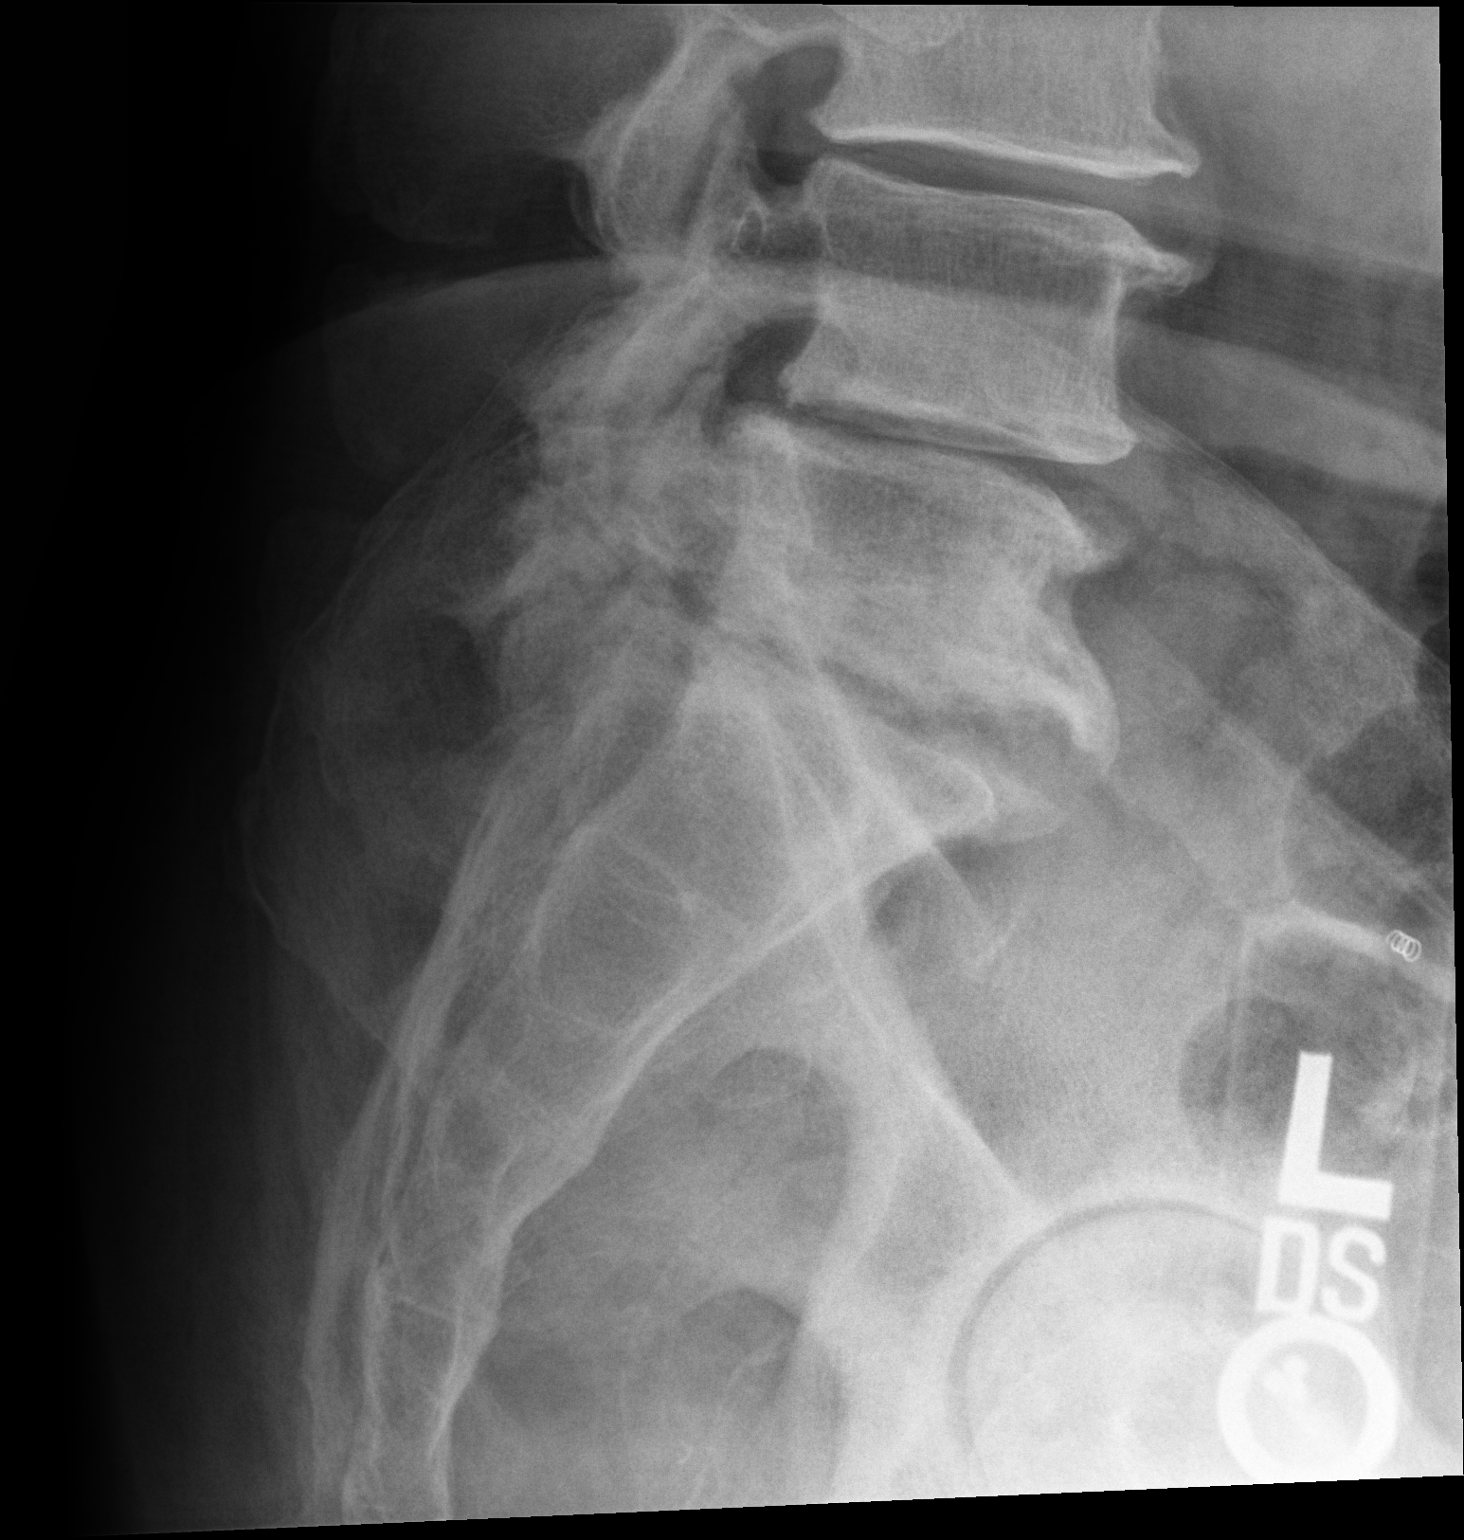

[3 of 3 positions shown; findings below may reference images not displayed]

FINDINGS: Stable disc space narrowing from L3-4 through L5-S1. No fracture or 
subluxation. Large left lateral osteophyte at the L2-L3 level. Facet sclerosis 
seen inferiorly. Atherosclerotic changes are seen within the abdominal aorta.
IMPRESSION: Grossly stable exam. No acute fracture seen.

## 2023-09-30 IMAGING — DX HIP BILATERAL WITH PELVIS 5 VIEWS
5 series · 5 of 5 positions shown · non-contrast
Comparison: None.

________________________________________________________________________________________________ 
HIP BILATERAL WITH PELVIS 5 VIEWS, 09/30/2023 [DATE]: 
CLINICAL INDICATION: Pain.

[AP (1 of 3)]
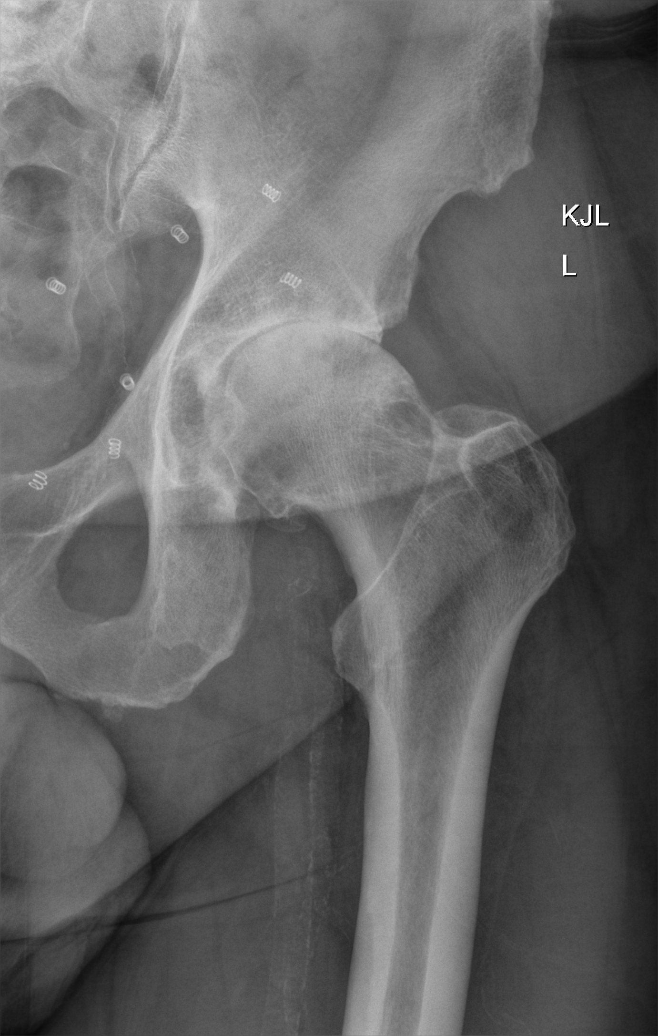

[AP (2 of 3)]
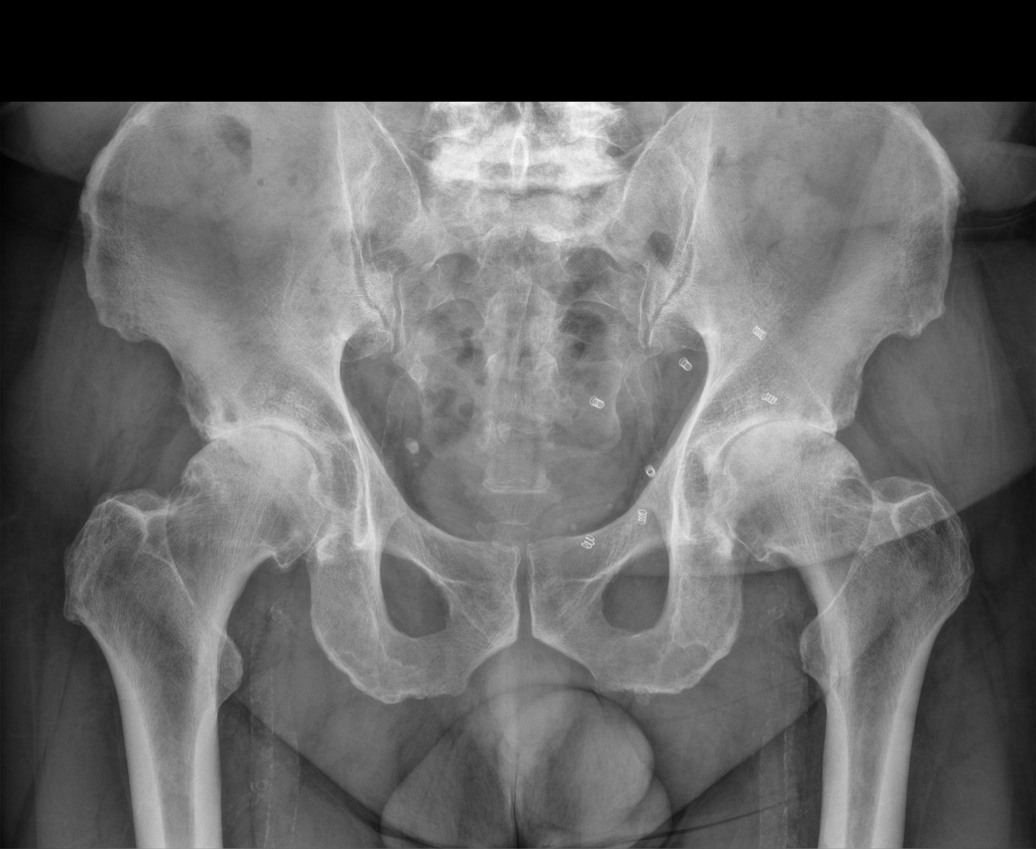

[AP (3 of 3)]
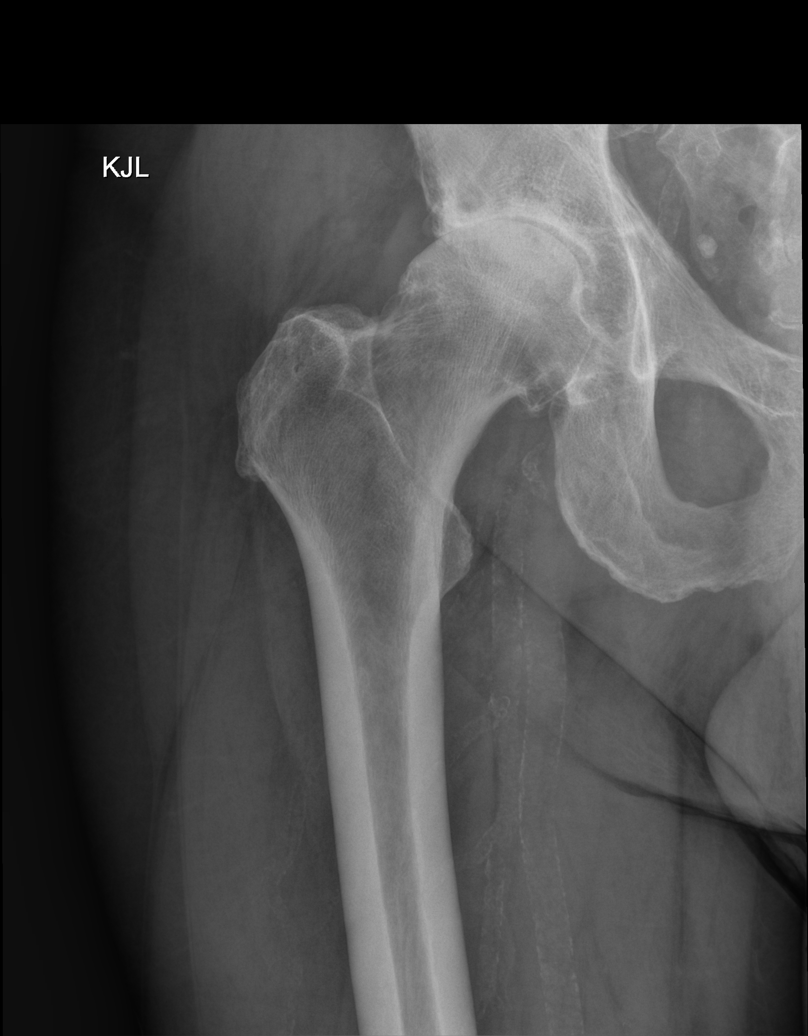

[frog (1 of 2)]
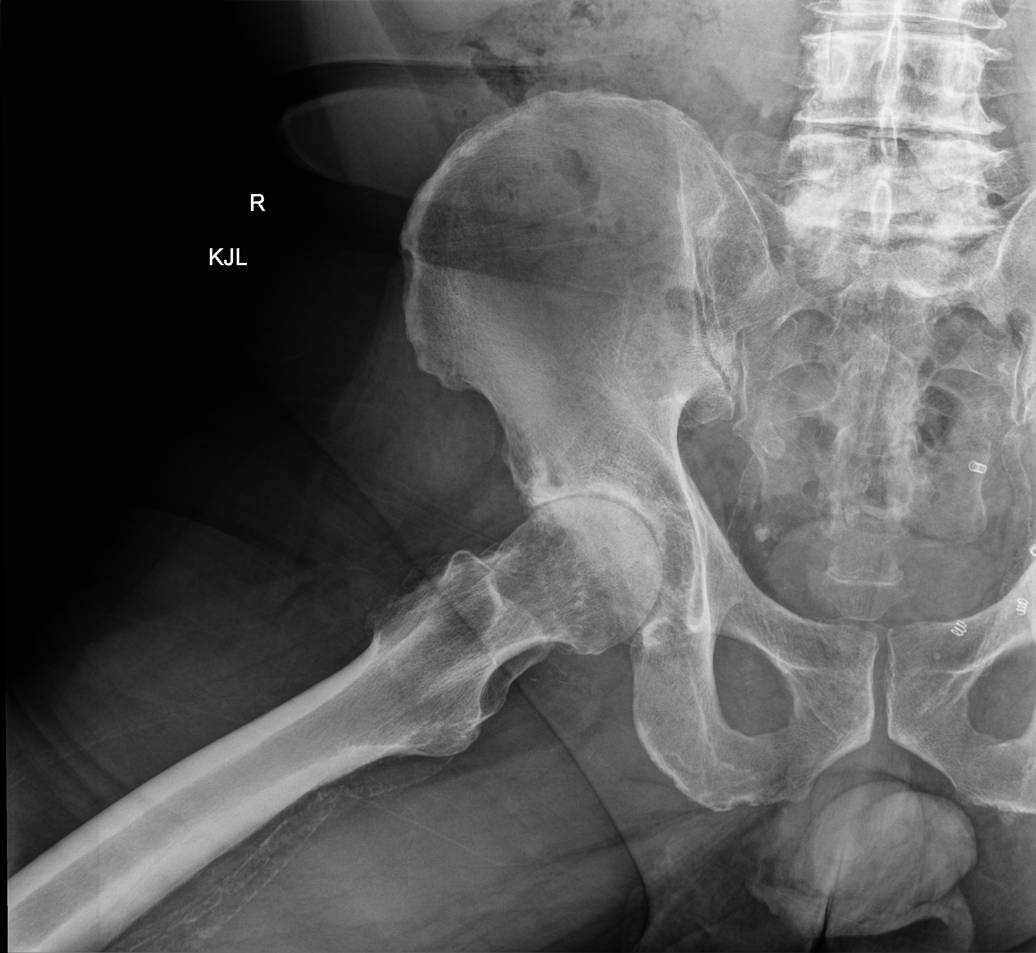

[frog (2 of 2)]
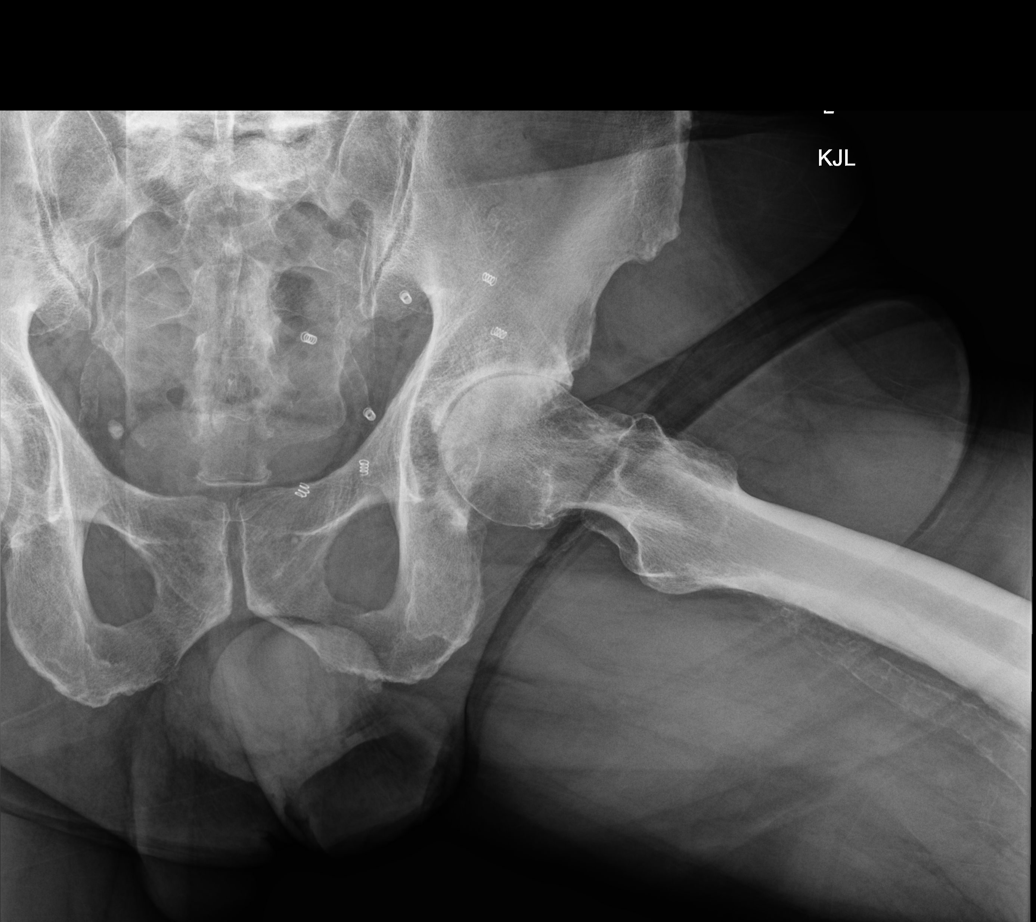

[5 of 5 positions shown; findings below may reference images not displayed]

FINDINGS: No fractures or dislocations. Marked degenerative change of the hips. 
Mild degenerative change of the SI joints and multilevel degenerative change of 
the spine. Mild enthesopathy. Normal bone density. Left inguinal herniorrhaphy 
with metallic anchors. Atherosclerosis.
IMPRESSION: Marked degenerative change of the hips.

## 2023-11-21 IMAGING — MR MRI LUMBAR SPINE W/WO CONTRAST
6 of 12 series · 14 of 48 positions shown · IV contrast (gadavist)
Comparison: MRI lumbar spine from March 20, 2020.

________________________________________________________________________________________________ 
MRI LUMBAR SPINE W/WO CONTRAST, 11/21/2023 [DATE]: 
CLINICAL INDICATION: Lumbar pain that radiates down both legs.
TECHNIQUE: Multiplanar, multiecho position MR images of the lumbar spine were 
performed without and with 9 mL of Gadavist were injected intravenously by hand. 
1 mL of Gadavist discarded.

[Series 201: survey · axial · 10.0mm · 1.39mm/px · 1 of 9 slices shown]
[im 1/9]
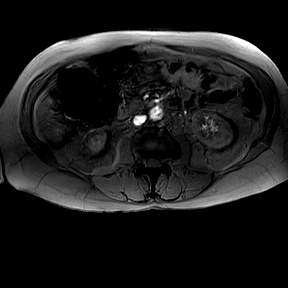

[Series 301: t2w_cor-surv/ls · coronal · 6.0mm · 0.50mm/px · 1 of 5 slices shown]
[im 1/5]
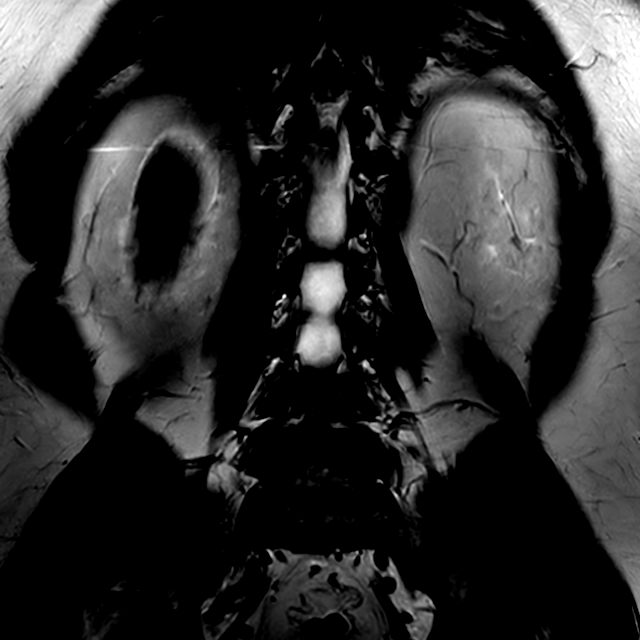

[Series 501: t1w_tse sag dne · sagittal · 4.0mm · 0.24mm/px · 1 of 17 slices shown]
[im 1/17]
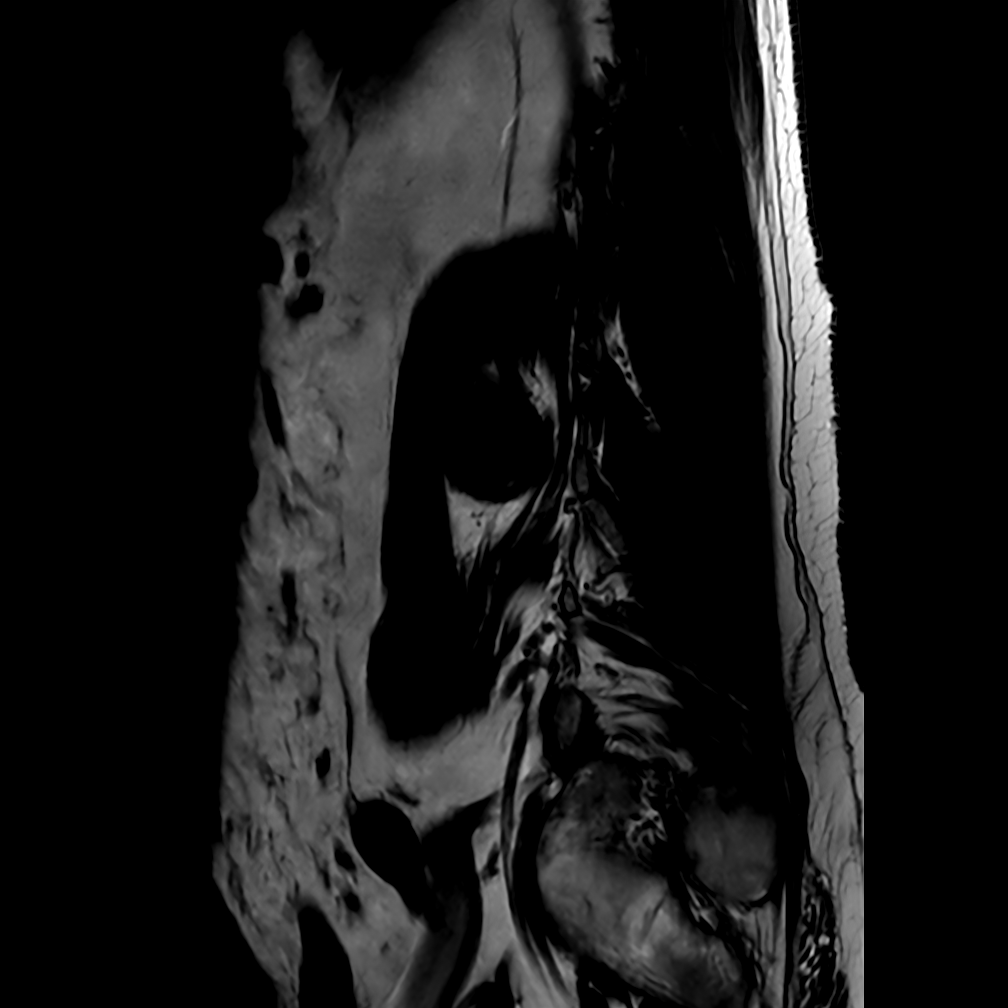

[Series 601: t2w_tse sag dne · sagittal · 4.0mm · 0.25mm/px · 1 of 17 slices shown]
[im 1/17]
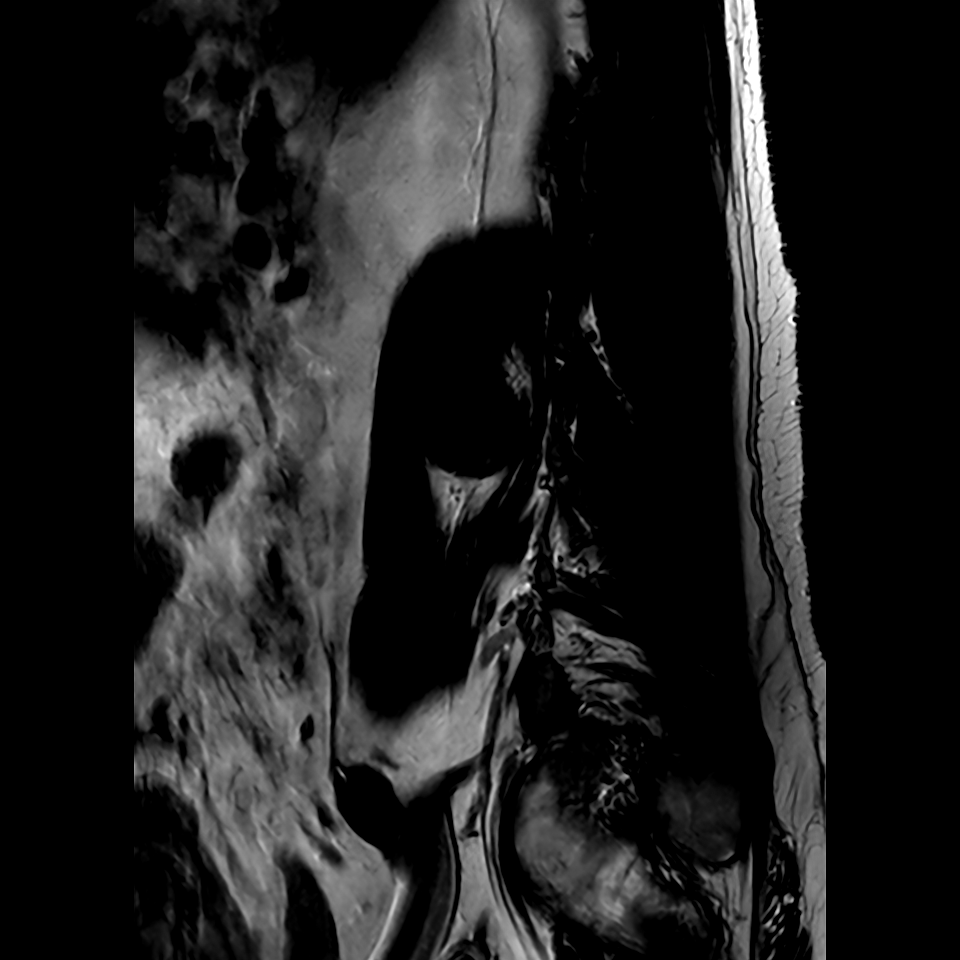

[Series 701: STIR · sagittal · 4.0mm · 0.62mm/px · 2 of 17 slices shown]
[im 1/17]
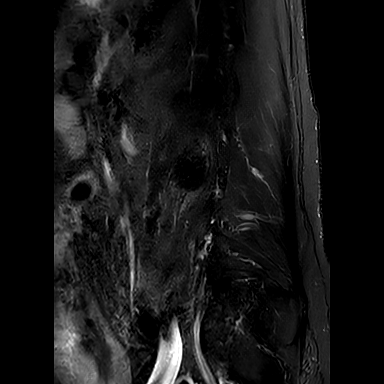
[im 17/17]
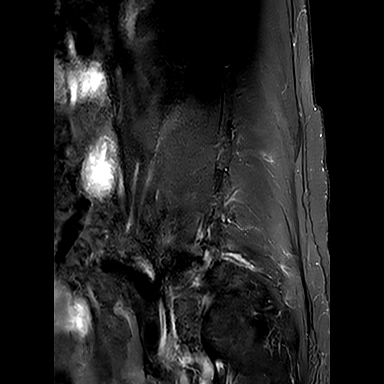

[Series 801: 3d_spine_view_t2w sag dne · sagittal · 1.5mm · 0.51mm/px · 8 of 107 slices shown]
[im 1/107]
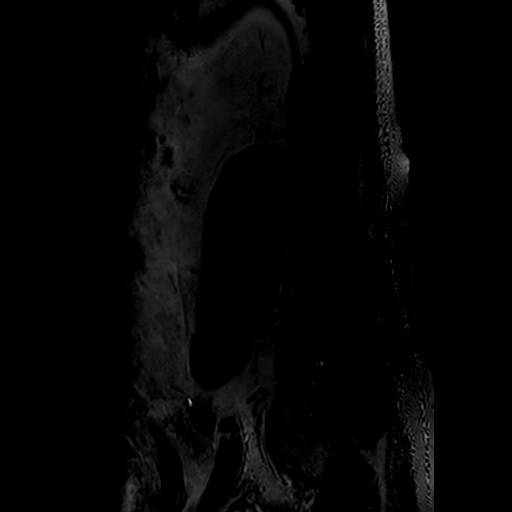
[im 18/107]
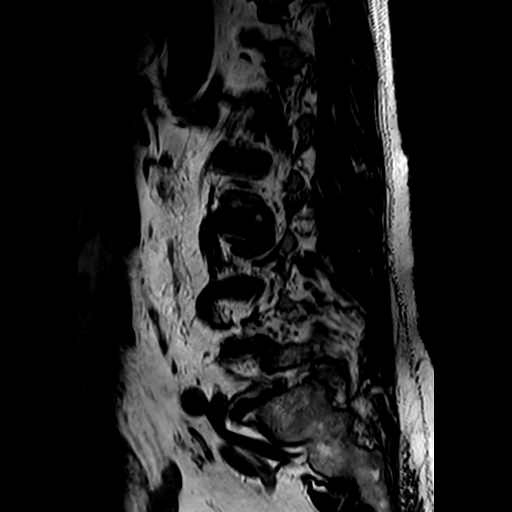
[im 36/107]
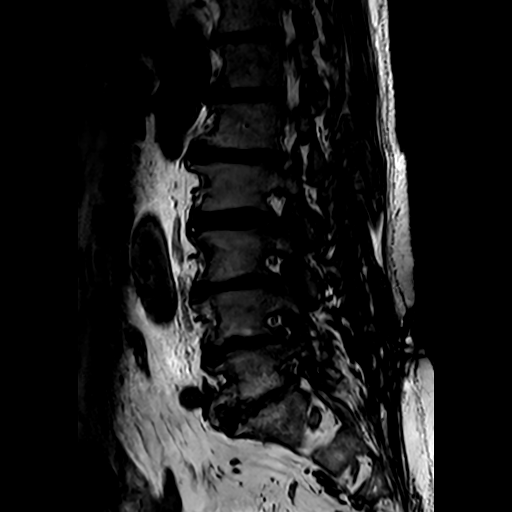
[im 45/107]
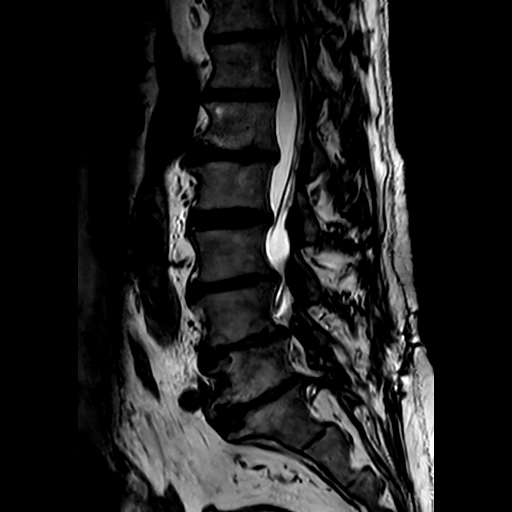
[im 62/107]
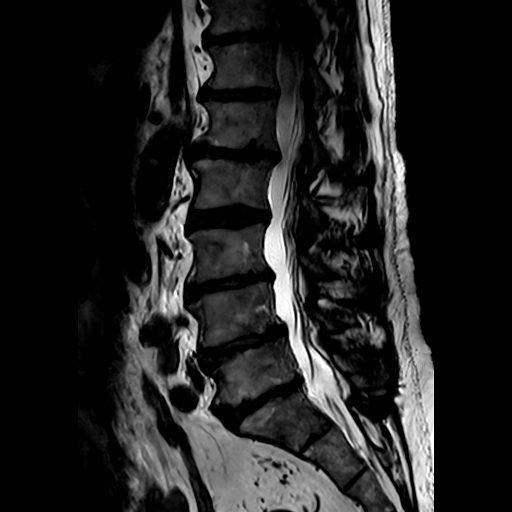
[im 71/107]
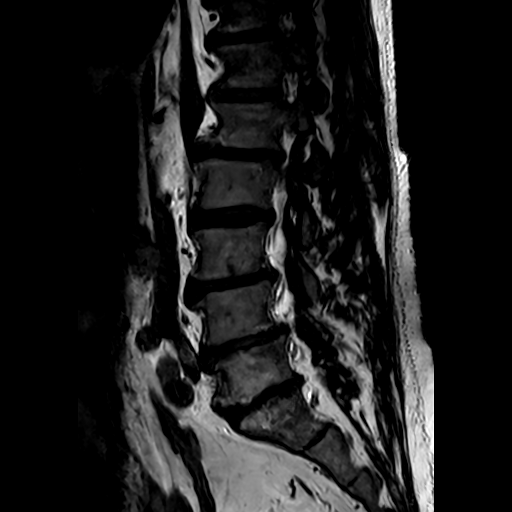
[im 89/107]
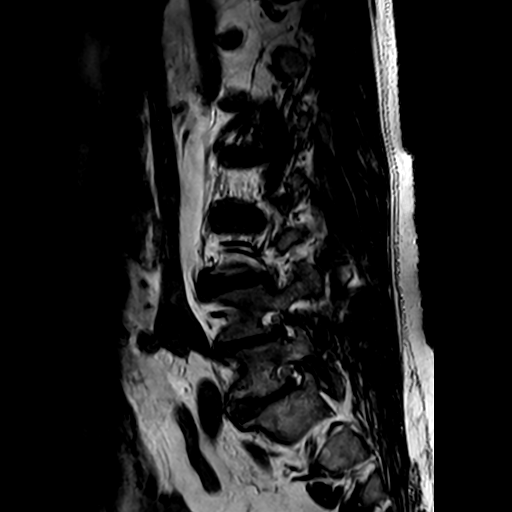
[im 107/107]
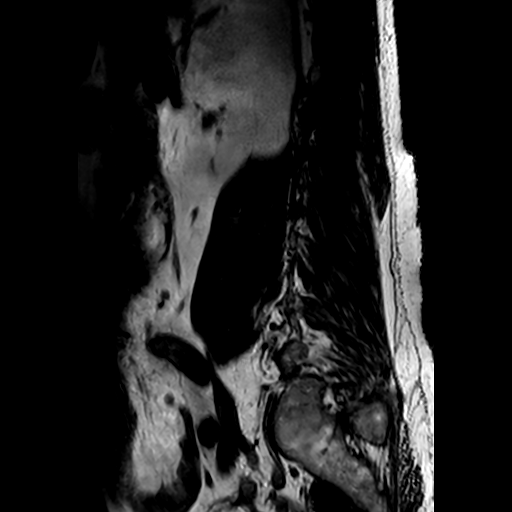

[14 of 48 positions shown; findings below may reference images not displayed]

FINDINGS: -------------------------------------------------------------------------------- 
------ 
GENERAL: 
Nomenclature is based on 5 lumbar type vertebral bodies.     
ALIGNMENT: Mild retrolisthesis of L1 on L2. Mild anterolisthesis of L3 on L4, L4 
on L5. 
VERTEBRAL BODY HEIGHT: Normal.  
MARROW SIGNAL: T2 prolongation at L4-L5 endplates from active endplate change. 
CORD SIGNAL: Normal distal spinal cord and cauda equina.  Conus terminates at 
L1-L2 level. No pathologic enhancement. 
ADDITIONAL FINDINGS: None. 
Modic I-II: All levels. 
Ligamentum Flavum > 2.5 mm: All levels. 
-------------------------------------------------------------------------------- 
------ 
SEGMENTAL: 
T12-L1: Trace disc bulge; no significant central canal narrowing.  No 
significant right neural foraminal narrowing. No significant left neural 
foraminal narrowing.  
L1-L2: Disc bulge; no significant central canal narrowing, mild bilateral 
subarticular recess narrowing.  No significant right neural foraminal narrowing. 
Mild left neural foraminal narrowing.  
L2-L3: Disc bulge with left far lateral disc osteophyte complex which does have 
mass effect upon the left L2 nerve root beyond the foramen, mild bilateral facet 
hypertrophy; no significant central canal narrowing.  No significant right 
neural foraminal narrowing. Mild left neural foraminal narrowing.  
L3-L4: Loss of disc height with disc bulge, tiny right proximal foraminal disc 
herniation suspected, bilateral facet hypertrophy; no significant central canal 
narrowing.  Moderate right neural foraminal narrowing. Mild left neural 
foraminal narrowing.  
L4-L5: Loss of disc height with disc bulge, bilateral facet hypertrophy; no 
significant central canal narrowing.  Moderate right neural foraminal narrowing. 
Moderate left neural foraminal narrowing.  
L5-S1: Loss of disc height with disc bulge, left hemilaminotomy with osseous 
bridging from the surgical site to the left subarticular margin of the disc, 
bilateral facet hypertrophy; no significant central canal narrowing.  Moderate 
right neural foraminal narrowing. Severe left neural foraminal narrowing.  
-------------------------------------------------------------------------------- 
------
IMPRESSION: 1.  Left far lateral disc osteophyte complex at L2-L3 causing mass effect upon 
the left L2 nerve root beyond the foramen. 
2.  Severe left neural foraminal narrowing L5-S1.
